# Patient Record
Sex: Male | Born: 1980 | Race: Black or African American | Hispanic: No | Marital: Single | Smoking: Never smoker
Health system: Southern US, Community
[De-identification: ages and names within clinical notes are randomized; demographics above are authoritative.]

## PROBLEM LIST (undated history)

## (undated) DIAGNOSIS — K5090 Crohn's disease, unspecified, without complications: Secondary | ICD-10-CM

## (undated) DIAGNOSIS — E559 Vitamin D deficiency, unspecified: Secondary | ICD-10-CM

## (undated) HISTORY — DX: Vitamin D deficiency, unspecified: E55.9

## (undated) HISTORY — PX: COLONOSCOPY: SHX174

## (undated) HISTORY — PX: OTHER SURGICAL HISTORY: SHX169

---

## 2016-03-15 ENCOUNTER — Encounter (HOSPITAL_COMMUNITY): Payer: Self-pay | Admitting: Emergency Medicine

## 2016-03-15 DIAGNOSIS — K50814 Crohn's disease of both small and large intestine with abscess: Secondary | ICD-10-CM | POA: Diagnosis not present

## 2016-03-15 DIAGNOSIS — K6812 Psoas muscle abscess: Secondary | ICD-10-CM | POA: Diagnosis present

## 2016-03-15 DIAGNOSIS — D638 Anemia in other chronic diseases classified elsewhere: Secondary | ICD-10-CM | POA: Diagnosis present

## 2016-03-15 DIAGNOSIS — K651 Peritoneal abscess: Secondary | ICD-10-CM | POA: Diagnosis present

## 2016-03-15 DIAGNOSIS — Z79899 Other long term (current) drug therapy: Secondary | ICD-10-CM

## 2016-03-15 DIAGNOSIS — Z7952 Long term (current) use of systemic steroids: Secondary | ICD-10-CM

## 2016-03-15 DIAGNOSIS — R634 Abnormal weight loss: Secondary | ICD-10-CM | POA: Diagnosis present

## 2016-03-15 DIAGNOSIS — K50114 Crohn's disease of large intestine with abscess: Secondary | ICD-10-CM | POA: Diagnosis not present

## 2016-03-15 DIAGNOSIS — Z6823 Body mass index (BMI) 23.0-23.9, adult: Secondary | ICD-10-CM

## 2016-03-15 DIAGNOSIS — R7989 Other specified abnormal findings of blood chemistry: Secondary | ICD-10-CM | POA: Diagnosis present

## 2016-03-15 NOTE — ED Triage Notes (Signed)
Pt states he has crohn's disease and has been having a lot of issues with it lately  Pt states he has a knot in his right lower quadrant about the size of a baseball that is causing him pain that is radiating down his right leg  Pt states he has had some weight loss over the past month  Pt states it is getting harder for him to pass his stool  Last BM was today and he states it was hard  Denies passing any blood in his stool

## 2016-03-16 ENCOUNTER — Emergency Department (HOSPITAL_COMMUNITY): Payer: BLUE CROSS/BLUE SHIELD

## 2016-03-16 ENCOUNTER — Inpatient Hospital Stay (HOSPITAL_COMMUNITY)
Admission: EM | Admit: 2016-03-16 | Discharge: 2016-03-27 | DRG: 385 | Disposition: A | Discharge status: Home Health | Payer: BLUE CROSS/BLUE SHIELD | Attending: Internal Medicine | Admitting: Internal Medicine

## 2016-03-16 DIAGNOSIS — K6812 Psoas muscle abscess: Secondary | ICD-10-CM | POA: Diagnosis present

## 2016-03-16 DIAGNOSIS — K50114 Crohn's disease of large intestine with abscess: Secondary | ICD-10-CM

## 2016-03-16 DIAGNOSIS — R634 Abnormal weight loss: Secondary | ICD-10-CM

## 2016-03-16 DIAGNOSIS — K5010 Crohn's disease of large intestine without complications: Secondary | ICD-10-CM | POA: Diagnosis present

## 2016-03-16 DIAGNOSIS — R633 Feeding difficulties, unspecified: Secondary | ICD-10-CM

## 2016-03-16 DIAGNOSIS — Z09 Encounter for follow-up examination after completed treatment for conditions other than malignant neoplasm: Secondary | ICD-10-CM

## 2016-03-16 DIAGNOSIS — Z7952 Long term (current) use of systemic steroids: Secondary | ICD-10-CM | POA: Diagnosis not present

## 2016-03-16 DIAGNOSIS — K50814 Crohn's disease of both small and large intestine with abscess: Secondary | ICD-10-CM | POA: Diagnosis present

## 2016-03-16 DIAGNOSIS — R7989 Other specified abnormal findings of blood chemistry: Secondary | ICD-10-CM | POA: Diagnosis present

## 2016-03-16 DIAGNOSIS — B9620 Unspecified Escherichia coli [E. coli] as the cause of diseases classified elsewhere: Secondary | ICD-10-CM | POA: Diagnosis not present

## 2016-03-16 DIAGNOSIS — D509 Iron deficiency anemia, unspecified: Secondary | ICD-10-CM | POA: Diagnosis present

## 2016-03-16 DIAGNOSIS — IMO0002 Reserved for concepts with insufficient information to code with codable children: Secondary | ICD-10-CM

## 2016-03-16 DIAGNOSIS — K651 Peritoneal abscess: Secondary | ICD-10-CM | POA: Diagnosis present

## 2016-03-16 DIAGNOSIS — K50014 Crohn's disease of small intestine with abscess: Secondary | ICD-10-CM | POA: Diagnosis not present

## 2016-03-16 DIAGNOSIS — D72829 Elevated white blood cell count, unspecified: Secondary | ICD-10-CM | POA: Diagnosis not present

## 2016-03-16 DIAGNOSIS — D649 Anemia, unspecified: Secondary | ICD-10-CM | POA: Diagnosis not present

## 2016-03-16 DIAGNOSIS — R935 Abnormal findings on diagnostic imaging of other abdominal regions, including retroperitoneum: Secondary | ICD-10-CM

## 2016-03-16 DIAGNOSIS — Z79899 Other long term (current) drug therapy: Secondary | ICD-10-CM | POA: Diagnosis not present

## 2016-03-16 DIAGNOSIS — B9689 Other specified bacterial agents as the cause of diseases classified elsewhere: Secondary | ICD-10-CM | POA: Diagnosis not present

## 2016-03-16 DIAGNOSIS — Z6823 Body mass index (BMI) 23.0-23.9, adult: Secondary | ICD-10-CM | POA: Diagnosis not present

## 2016-03-16 DIAGNOSIS — D638 Anemia in other chronic diseases classified elsewhere: Secondary | ICD-10-CM | POA: Diagnosis present

## 2016-03-16 HISTORY — DX: Crohn's disease, unspecified, without complications: K50.90

## 2016-03-16 LAB — COMPREHENSIVE METABOLIC PANEL
ALK PHOS: 39 U/L (ref 38–126)
ALT: 12 U/L — ABNORMAL LOW (ref 17–63)
ANION GAP: 8 (ref 5–15)
AST: 13 U/L — ABNORMAL LOW (ref 15–41)
Albumin: 3.3 g/dL — ABNORMAL LOW (ref 3.5–5.0)
BILIRUBIN TOTAL: 0.4 mg/dL (ref 0.3–1.2)
BUN: 12 mg/dL (ref 6–20)
CALCIUM: 8.8 mg/dL — AB (ref 8.9–10.3)
CO2: 25 mmol/L (ref 22–32)
Chloride: 102 mmol/L (ref 101–111)
Creatinine, Ser: 0.78 mg/dL (ref 0.61–1.24)
GFR calc non Af Amer: >60 mL/min (ref >60)
Glucose, Bld: 96 mg/dL (ref 65–99)
POTASSIUM: 3.7 mmol/L (ref 3.5–5.1)
SODIUM: 135 mmol/L (ref 135–145)
TOTAL PROTEIN: 7.8 g/dL (ref 6.5–8.1)

## 2016-03-16 LAB — CBC WITH DIFFERENTIAL/PLATELET
Basophils Absolute: 0 10*3/uL (ref 0.0–0.1)
Basophils Relative: 0 %
EOS ABS: 0.1 10*3/uL (ref 0.0–0.7)
Eosinophils Relative: 1 %
HEMATOCRIT: 33 % — AB (ref 39.0–52.0)
HEMOGLOBIN: 10.6 g/dL — AB (ref 13.0–17.0)
LYMPHS ABS: 1.6 10*3/uL (ref 0.7–4.0)
Lymphocytes Relative: 13 %
MCH: 26.2 pg (ref 26.0–34.0)
MCHC: 32.1 g/dL (ref 30.0–36.0)
MCV: 81.5 fL (ref 78.0–100.0)
MONO ABS: 1.2 10*3/uL — AB (ref 0.1–1.0)
MONOS PCT: 10 %
NEUTROS PCT: 76 %
Neutro Abs: 9.3 10*3/uL — ABNORMAL HIGH (ref 1.7–7.7)
Platelets: 483 10*3/uL — ABNORMAL HIGH (ref 150–400)
RBC: 4.05 MIL/uL — ABNORMAL LOW (ref 4.22–5.81)
RDW: 14.1 % (ref 11.5–15.5)
WBC: 12.3 10*3/uL — ABNORMAL HIGH (ref 4.0–10.5)

## 2016-03-16 LAB — URINALYSIS, ROUTINE W REFLEX MICROSCOPIC
Bilirubin Urine: NEGATIVE
GLUCOSE, UA: NEGATIVE mg/dL
Ketones, ur: 5 mg/dL — AB
Leukocytes, UA: NEGATIVE
NITRITE: NEGATIVE
PH: 5 (ref 5.0–8.0)
Protein, ur: 30 mg/dL — AB
SPECIFIC GRAVITY, URINE: 1.029 (ref 1.005–1.030)

## 2016-03-16 LAB — LIPASE, BLOOD: Lipase: 19 U/L (ref 11–51)

## 2016-03-16 MED ORDER — ALBUTEROL SULFATE (2.5 MG/3ML) 0.083% IN NEBU: 2.5000 mg | INHALATION_SOLUTION | RESPIRATORY_TRACT | Status: DC | PRN

## 2016-03-16 MED ORDER — SODIUM CHLORIDE 0.9 % IV SOLN
INTRAVENOUS | Status: AC
  Administered 2016-03-16 – 2016-03-17 (×4): via INTRAVENOUS

## 2016-03-16 MED ORDER — PANTOPRAZOLE SODIUM 40 MG IV SOLR
40.0000 mg | Freq: Two times a day (BID) | INTRAVENOUS | Status: DC
  Administered 2016-03-16 (×2): 40 mg via INTRAVENOUS
  Filled 2016-03-16 (×2): qty 40

## 2016-03-16 MED ORDER — IOPAMIDOL (ISOVUE-300) INJECTION 61%
30.0000 mL | Freq: Once | INTRAVENOUS | Status: AC | PRN
  Administered 2016-03-16: 30 mL via ORAL

## 2016-03-16 MED ORDER — KETOROLAC TROMETHAMINE 30 MG/ML IJ SOLN
15.0000 mg | Freq: Once | INTRAMUSCULAR | Status: AC
  Administered 2016-03-16: 15 mg via INTRAVENOUS
  Filled 2016-03-16: qty 1

## 2016-03-16 MED ORDER — ENOXAPARIN SODIUM 30 MG/0.3ML ~~LOC~~ SOLN
30.0000 mg | SUBCUTANEOUS | Status: DC
  Administered 2016-03-17: 30 mg via SUBCUTANEOUS
  Filled 2016-03-16 (×2): qty 0.3

## 2016-03-16 MED ORDER — ONDANSETRON HCL 4 MG/2ML IJ SOLN: 4.0000 mg | Freq: Four times a day (QID) | INTRAMUSCULAR | Status: DC | PRN

## 2016-03-16 MED ORDER — METHYLPREDNISOLONE SODIUM SUCC 125 MG IJ SOLR
125.0000 mg | Freq: Once | INTRAMUSCULAR | Status: AC
  Administered 2016-03-16: 125 mg via INTRAVENOUS
  Filled 2016-03-16: qty 2

## 2016-03-16 MED ORDER — PIPERACILLIN-TAZOBACTAM 3.375 G IVPB
3.3750 g | Freq: Three times a day (TID) | INTRAVENOUS | Status: DC
  Administered 2016-03-16 – 2016-03-24 (×24): 3.375 g via INTRAVENOUS
  Filled 2016-03-16 (×20): qty 50

## 2016-03-16 MED ORDER — MORPHINE SULFATE (PF) 2 MG/ML IV SOLN
1.0000 mg | INTRAVENOUS | Status: DC | PRN
  Administered 2016-03-16 – 2016-03-20 (×19): 2 mg via INTRAVENOUS
  Filled 2016-03-16 (×19): qty 1

## 2016-03-16 MED ORDER — PIPERACILLIN-TAZOBACTAM 3.375 G IVPB
3.3750 g | Freq: Once | INTRAVENOUS | Status: AC
  Administered 2016-03-16: 3.375 g via INTRAVENOUS
  Filled 2016-03-16: qty 50

## 2016-03-16 MED ORDER — IOPAMIDOL (ISOVUE-300) INJECTION 61%
INTRAVENOUS | Status: AC
  Administered 2016-03-16: 30 mL via ORAL
  Filled 2016-03-16: qty 30

## 2016-03-16 MED ORDER — ONDANSETRON HCL 4 MG PO TABS: 4.0000 mg | ORAL_TABLET | Freq: Four times a day (QID) | ORAL | Status: DC | PRN

## 2016-03-16 MED ORDER — IOPAMIDOL (ISOVUE-300) INJECTION 61%
100.0000 mL | Freq: Once | INTRAVENOUS | Status: AC | PRN
  Administered 2016-03-16: 100 mL via INTRAVENOUS

## 2016-03-16 MED ORDER — SODIUM CHLORIDE 0.9 % IV BOLUS (SEPSIS)
1000.0000 mL | INTRAVENOUS | Status: AC
  Administered 2016-03-16: 1000 mL via INTRAVENOUS

## 2016-03-16 MED ORDER — AZATHIOPRINE 50 MG PO TABS
100.0000 mg | ORAL_TABLET | Freq: Every day | ORAL | Status: DC
  Administered 2016-03-16: 100 mg via ORAL
  Filled 2016-03-16: qty 2

## 2016-03-16 NOTE — H&P (Addendum)
History and Physical    [name] PQZ:[id number] DOB: 03/01/1980 DOA: 03/16/2016  Referring MD/NP/PA: Antonietta Breach PA-C PCP: Pcp Not In System  Patient coming from: home  Chief Complaint: Abd  HPI: Larry [name] is a 36 y.o. male with medical history significant of Crohn's colitis on azathioprine originally diagnosed in 2015; who presents with waxing and waning sharp right lower quadrant abdominal pain. Patient notes symptoms initially flared up approximately 5 months ago. He was evaluated at [facility] by CT scan which showed inflammation. At that time he reports being placed on steroid taper of 40 mg of prednisone that was slowly tapered down with approximately 2 week intervals to approximately 5 mg. However, sometime in late November/early December symptoms worsen which which he was advised by his gastroenterologist to increase dose of prednisone back to 10 mg, but had been since tapered down to 5 mg the over last 2 weeks. Reports being followed by Dr. Doristine Mango of gastroenterology in the outpatient setting who was previously at [facility], but recently switched practices at the end of December. Dr. Britta Mccreedy needed the patient's records from his practice to continue to see him. Since that time he notes more frequent right lower abdominal quadrant pain and reports that the area swells up to the size of a baseball. He reports only having 1 hard stool every 3 days or so. Associated symptoms include right leg pain and weight loss of 15 pounds at least in the last month and approximately 35 in the last 6 months. Denies having any fever, chills, nausea, vomiting, chills, dark stools, or dysuria. Previously, when the patient was initially diagnosed with Crohn's disease back in 2015 he reported having abscess for which surgical intervention was required.  ED Course: Upon admission into the ED the patient was found to be afebrile with vitals within normal limits.  Lab work  revealed WBC 12.3, hemoglobin 10.6, platelets 483, and all other labs relatively within normal limits. CT scan revealed active Crohn's colitis with abscess collections in the right lower quadrant involving iliopsoas muscle. Patient given 125 mg of Solu-Medrol and Zosyn while in the ED.  Review of Systems: As per HPI otherwise 10 point review of systems negative.   Past Medical History:  Diagnosis Date  . Crohn's disease [facility])     Past Surgical History:  Procedure Laterality Date  . bowel abscess from crohns        reports that he has never smoked. He has never used smokeless tobacco. He reports that he does not drink alcohol or use drugs.  No Known Allergies  History reviewed. No pertinent family history.  Prior to Admission medications   Medication Sig Start Date End Date Taking? Authorizing Provider  acetaminophen (TYLENOL) 500 MG tablet Take 1,000 mg by mouth every 6 (six) hours as needed for moderate pain.   Yes Historical Provider, MD  azaTHIOprine (IMURAN) 50 MG tablet Take 2 tablets by mouth daily. 02/18/16  Yes Historical Provider, MD  predniSONE (DELTASONE) 10 MG tablet Take 5 mg by mouth daily.  03/02/16  Yes Historical Provider, MD    Physical Exam:  Constitutional: Young male in NAD, calm, comfortable Vitals:   03/15/16 2340 03/16/16 0334  BP: 118/71 113/69  Pulse: 82 86  Resp: 16 16  Temp: 98.3 F (36.8 C)   TempSrc: Oral   SpO2: 97% 99%  Weight: 78.5 kg (173 lb)   Height: 6' (1.829 m)    Eyes: PERRL, lids and conjunctivae normal ENMT: Mucous  membranes are moist. Posterior pharynx clear of any exudate or lesions.Normal dentition.  Neck: normal, supple, no masses, no thyromegaly Respiratory: clear to auscultation bilaterally, no wheezing, no crackles. Normal respiratory effort. No accessory muscle use.  Cardiovascular: Regular rate and rhythm, no murmurs / rubs / gallops. No extremity edema. 2+ pedal pulses. No carotid bruits.  Abdomen: Mild to moderate right  lower quadrant tenderness, no masses palpated. No hepatosplenomegaly. Bowel sounds positive.  Musculoskeletal: no clubbing / cyanosis. No joint deformity upper and lower extremities. Good ROM, no contractures. Normal muscle tone.  Skin: no rashes, lesions, ulcers. No induration Neurologic: CN 2-12 grossly intact. Sensation intact, DTR normal. Strength 5/5 in all 4.  Psychiatric: Normal judgment and insight. Alert and oriented x 3. Normal mood.     Labs on Admission: I have personally reviewed following labs and imaging studies  CBC:  Recent Labs Lab 03/16/16 0101  WBC 12.3*  NEUTROABS 9.3*  HGB 10.6*  HCT 33.0*  MCV 81.5  PLT 329*   Basic Metabolic Panel:  Recent Labs Lab 03/16/16 0101  NA 135  K 3.7  CL 102  CO2 25  GLUCOSE 96  BUN 12  CREATININE 0.78  CALCIUM 8.8*   GFR: Estimated Creatinine Clearance: 144.2 mL/min (by C-G formula based on SCr of 0.78 mg/dL). Liver Function Tests:  Recent Labs Lab 03/16/16 0101  AST 13*  ALT 12*  ALKPHOS 39  BILITOT 0.4  PROT 7.8  ALBUMIN 3.3*    Recent Labs Lab 03/16/16 0101  LIPASE 19   No results for input(s): AMMONIA in the last 168 hours. Coagulation Profile: No results for input(s): INR, PROTIME in the last 168 hours. Cardiac Enzymes: No results for input(s): CKTOTAL, CKMB, CKMBINDEX, TROPONINI in the last 168 hours. BNP (last 3 results) No results for input(s): PROBNP in the last 8760 hours. HbA1C: No results for input(s): HGBA1C in the last 72 hours. CBG: No results for input(s): GLUCAP in the last 168 hours. Lipid Profile: No results for input(s): CHOL, HDL, LDLCALC, TRIG, CHOLHDL, LDLDIRECT in the last 72 hours. Thyroid Function Tests: No results for input(s): TSH, T4TOTAL, FREET4, T3FREE, THYROIDAB in the last 72 hours. Anemia Panel: No results for input(s): VITAMINB12, FOLATE, FERRITIN, TIBC, IRON, RETICCTPCT in the last 72 hours. Urine analysis:    Component Value Date/Time   COLORURINE AMBER  (A) 03/16/2016 0219   APPEARANCEUR CLEAR 03/16/2016 0219   LABSPEC 1.029 03/16/2016 0219   PHURINE 5.0 03/16/2016 0219   GLUCOSEU NEGATIVE 03/16/2016 0219   HGBUR SMALL (A) 03/16/2016 0219   BILIRUBINUR NEGATIVE 03/16/2016 0219   KETONESUR 5 (A) 03/16/2016 0219   PROTEINUR 30 (A) 03/16/2016 0219   NITRITE NEGATIVE 03/16/2016 0219   LEUKOCYTESUR NEGATIVE 03/16/2016 0219   Sepsis Labs: No results found for this or any previous visit (from the past 240 hour(s)).   Radiological Exams on Admission: Ct Abdomen Pelvis W Contrast  Result Date: 03/16/2016 CLINICAL DATA:  Abdominal pain. Right lower quadrant pain. Weight loss. Crohn's disease. EXAM: CT ABDOMEN AND PELVIS WITH CONTRAST TECHNIQUE: Multidetector CT imaging of the abdomen and pelvis was performed using the standard protocol following bolus administration of intravenous contrast. CONTRAST:  15m ISOVUE-300 IOPAMIDOL (ISOVUE-300) INJECTION 61% COMPARISON:  [facility] FINDINGS: Lower chest: The lung bases are clear. Hepatobiliary: No focal liver abnormality is seen. No gallstones, gallbladder wall thickening, or biliary dilatation. Pancreas: No ductal dilatation or inflammation. Spleen: Normal in size without focal abnormality. Adrenals/Urinary Tract: Normal adrenal  glands. Symmetric renal enhancement without hydronephrosis. Urinary bladder is physiologically distended. Stomach/Bowel: Wall thickening involving the terminal ileum and cecum with moderate adjacent mesenteric inflammation consistent with active Crohn's. There multiple adjacent irregular fluid collections with peripheral enhancement in the right lower quadrant consistent with abscess. These involve both the adjacent fat and right iliopsoas muscle. A discrete collection anteriorly measures 2.8 x 1.7 cm. This may be contiguous with the more posterior collection measuring 2.5 x 4.7 cm, which involves the iliopsoas muscle. The appendix is  tentatively identified containing intraluminal fluid with similar stranding to the adjacent inflamed cecum and ascending colon. No other bowel wall thickening to suggest additional sites of active Crohn's. Moderate stool in the more distal colon. Vascular/Lymphatic: No significant vascular findings are present. No enlarged abdominal or pelvic lymph nodes. Reproductive: Prostate is unremarkable. Other: Right lower quadrant abscesses as described. No pelvic free fluid. No free air. Musculoskeletal: Heterogeneous edema of the right iliopsoas muscle with small intramuscular abscess as described. Mild heterogeneous enlargement of the distal iliopsoas likely myositis. There are no acute or suspicious osseous abnormalities. IMPRESSION: Active Crohn's involving the terminal ileum and cecum. There are adjacent irregular abscesses in the right lower quadrant involving the adjacent fat and iliopsoas muscle. Fluid collections are irregular in shape, and measure approximately 2.8 and 4.7 cm respectively. Electronically Signed   By: Jeb Levering M.D.   On: 03/16/2016 03:24      Assessment/Plan Crohn's colitis, with abscess: Acute. Patient reports worsening right lower quadrant pain. Imaging studies revealed active Crohn's flare of the terminal ileum and cecum with abscesses in the right lower quadrant involving iliopsoas muscle. Given 125 mg of Solu-Medrol and Zosyn in the ED - Admit to MedSurg bed - NPO except for meds - Strict ins and outs - Continue Zosyn   - Morphine IV prn severe pain - Will need to determine need of continued high-dose steroids - Requested records from [facility]. - Will likely need to consult GI in a.m. to determine best course of treatment including possible need of IR/surgery.     Leukocytosis: WBC elevated at 12.3 suspect secondary to acute disease with possible infection. Lower on the differential includes the possibility of being due to chronic steroid  use. - Continue to monitor  Anemia: Initial hemoglobin on to be 10.6. - Continue to monitor    Thrombocytosis: likely reactive   Weight loss: Weight loss patient notes a 15 pound weight loss in the month.  GI prophylaxis:Protonix IV  DVT prophylaxis: SCDs  Code Status: Full  Family Communication: Discuss plan of care with the patient is mother who is present at bedside  Disposition Plan: Likely discharge home once medically stable  Consults called: None  Admission status: Observation   Norval Morton MD [facility] Pager [phone]  If 7PM-7AM, please contact night-coverage [email or web address] Password [facility]  03/16/2016, 4:27 AM

## 2016-03-16 NOTE — Consult Note (Signed)
Reason for Consult:  Crohn's with intraabdominal abscess Referring Physician: Dr. Luvenia Starch GI:  Dr. Acquanetta Belling  Larry [name] is an 36 y.o. male w/ medical history of crohn's on azathioprine, originally diagnosed in 2015, who presented to [facility] with worsening abdominal pain. States he had a crohn's flare in 09/2015 at which time he was started on Prednisone and placed on a taper. Towards the end of his taper the abdominal pain returned, bringing him to the ED. Today he endorses increasing pain in his RLQ/right hip and swelling in the RLQ reported to be the size of a baseball. Associated symptoms include anorexia, hard stools q3days, 15 pound weight loss in the last month and 35 pounds over the last 6 months. He denies fever, chills, CP, SOB, dysuria, hematuria, hematochezia, and weakness/near-syncope. Current medicines include Imuran and Prednisone. He has a history of diagnostic laparoscopy in 2015. He denies use of blood thinning medications.  Work up shows he is afebrile, VSS.  WBC is 12.3, H/H is 10.6 and 33.   CT scan show:  Active Crohn's involving the terminal ileum and cecum. There are adjacent irregular abscesses in the right lower quadrant involving the adjacent fat and iliopsoas muscle. Fluid collections are irregular in shape, and measure approximately 2.8 and 4.7 cm respectively.  Past Medical History:  Diagnosis Date  . Crohn's disease [facility])     Past Surgical History:  Procedure Laterality Date  . bowel abscess from crohns       History reviewed. No pertinent family history.  Social History:  reports that he has never smoked. He has never used smokeless tobacco. He reports that he does not drink alcohol or use drugs.  Allergies: No Known Allergies  Medications: I have reviewed the patient's current medications.  Results for orders placed or performed during the hospital encounter of 03/16/16 (from the past 48 hour(s))  CBC with Differential     Status: Abnormal    Collection Time: 03/16/16  1:01 AM  Result Value Ref Range   WBC 12.3 (H) 4.0 - 10.5 K/uL   RBC 4.05 (L) 4.22 - 5.81 MIL/uL   Hemoglobin 10.6 (L) 13.0 - 17.0 g/dL   HCT 33.0 (L) 39.0 - 52.0 %   MCV 81.5 78.0 - 100.0 fL   MCH 26.2 26.0 - 34.0 pg   MCHC 32.1 30.0 - 36.0 g/dL   RDW 14.1 11.5 - 15.5 %   Platelets 483 (H) 150 - 400 K/uL   Neutrophils Relative % 76 %   Neutro Abs 9.3 (H) 1.7 - 7.7 K/uL   Lymphocytes Relative 13 %   Lymphs Abs 1.6 0.7 - 4.0 K/uL   Monocytes Relative 10 %   Monocytes Absolute 1.2 (H) 0.1 - 1.0 K/uL   Eosinophils Relative 1 %   Eosinophils Absolute 0.1 0.0 - 0.7 K/uL   Basophils Relative 0 %   Basophils Absolute 0.0 0.0 - 0.1 K/uL  Comprehensive metabolic panel     Status: Abnormal   Collection Time: 03/16/16  1:01 AM  Result Value Ref Range   Sodium 135 135 - 145 mmol/L   Potassium 3.7 3.5 - 5.1 mmol/L   Chloride 102 101 - 111 mmol/L   CO2 25 22 - 32 mmol/L   Glucose, Bld 96 65 - 99 mg/dL   BUN 12 6 - 20 mg/dL   Creatinine, Ser 0.78 0.61 - 1.24 mg/dL   Calcium 8.8 (L) 8.9 - 10.3 mg/dL   Total Protein 7.8 6.5 - 8.1 g/dL  Albumin 3.3 (L) 3.5 - 5.0 g/dL   AST 13 (L) 15 - 41 U/L   ALT 12 (L) 17 - 63 U/L   Alkaline Phosphatase 39 38 - 126 U/L   Total Bilirubin 0.4 0.3 - 1.2 mg/dL   GFR calc non Af Amer >60 >60 mL/min   GFR calc Af Amer >60 >60 mL/min    Comment: (NOTE) The eGFR has been calculated using the CKD EPI equation. This calculation has not been validated in all clinical situations. eGFR's persistently <60 mL/min signify possible Chronic Kidney Disease.    Anion gap 8 5 - 15  Lipase, blood     Status: None   Collection Time: 03/16/16  1:01 AM  Result Value Ref Range   Lipase 19 11 - 51 U/L  Urinalysis, Routine w reflex microscopic     Status: Abnormal   Collection Time: 03/16/16  2:19 AM  Result Value Ref Range   Color, Urine AMBER (A) YELLOW    Comment: BIOCHEMICALS MAY BE AFFECTED BY COLOR   APPearance CLEAR CLEAR   Specific  Gravity, Urine 1.029 1.005 - 1.030   pH 5.0 5.0 - 8.0   Glucose, UA NEGATIVE NEGATIVE mg/dL   Hgb urine dipstick SMALL (A) NEGATIVE   Bilirubin Urine NEGATIVE NEGATIVE   Ketones, ur 5 (A) NEGATIVE mg/dL   Protein, ur 30 (A) NEGATIVE mg/dL   Nitrite NEGATIVE NEGATIVE   Leukocytes, UA NEGATIVE NEGATIVE   RBC / HPF 0-5 0 - 5 RBC/hpf   WBC, UA 0-5 0 - 5 WBC/hpf   Bacteria, UA RARE (A) NONE SEEN   Squamous Epithelial / LPF 0-5 (A) NONE SEEN   Mucous PRESENT    Ca Oxalate Crys, UA PRESENT     Ct Abdomen Pelvis W Contrast  Result Date: 03/16/2016 CLINICAL DATA:  Abdominal pain. Right lower quadrant pain. Weight loss. Crohn's disease. EXAM: CT ABDOMEN AND PELVIS WITH CONTRAST TECHNIQUE: Multidetector CT imaging of the abdomen and pelvis was performed using the standard protocol following bolus administration of intravenous contrast. CONTRAST:  135m ISOVUE-300 IOPAMIDOL (ISOVUE-300) INJECTION 61% COMPARISON:  [facility] FINDINGS: Lower chest: The lung bases are clear. Hepatobiliary: No focal liver abnormality is seen. No gallstones, gallbladder wall thickening, or biliary dilatation. Pancreas: No ductal dilatation or inflammation. Spleen: Normal in size without focal abnormality. Adrenals/Urinary Tract: Normal adrenal glands. Symmetric renal enhancement without hydronephrosis. Urinary bladder is physiologically distended. Stomach/Bowel: Wall thickening involving the terminal ileum and cecum with moderate adjacent mesenteric inflammation consistent with active Crohn's. There multiple adjacent irregular fluid collections with peripheral enhancement in the right lower quadrant consistent with abscess. These involve both the adjacent fat and right iliopsoas muscle. A discrete collection anteriorly measures 2.8 x 1.7 cm. This may be contiguous with the more posterior collection measuring 2.5 x 4.7 cm, which involves the iliopsoas muscle. The appendix is  tentatively identified containing intraluminal fluid with similar stranding to the adjacent inflamed cecum and ascending colon. No other bowel wall thickening to suggest additional sites of active Crohn's. Moderate stool in the more distal colon. Vascular/Lymphatic: No significant vascular findings are present. No enlarged abdominal or pelvic lymph nodes. Reproductive: Prostate is unremarkable. Other: Right lower quadrant abscesses as described. No pelvic free fluid. No free air. Musculoskeletal: Heterogeneous edema of the right iliopsoas muscle with small intramuscular abscess as described. Mild heterogeneous enlargement of the distal iliopsoas likely myositis. There are no acute or suspicious osseous abnormalities. IMPRESSION: Active Crohn's involving the terminal  ileum and cecum. There are adjacent irregular abscesses in the right lower quadrant involving the adjacent fat and iliopsoas muscle. Fluid collections are irregular in shape, and measure approximately 2.8 and 4.7 cm respectively. Electronically Signed   By: Jeb Levering M.D.   On: 03/16/2016 03:24    Review of Systems  Constitutional: Negative for chills and fever.  Respiratory: Negative for shortness of breath.   Cardiovascular: Negative for chest pain and palpitations.  Gastrointestinal: Positive for abdominal pain. Negative for blood in stool, diarrhea, melena, nausea and vomiting.  Genitourinary: Negative for dysuria and frequency.  Musculoskeletal: Negative for myalgias.  Neurological: Negative for loss of consciousness.  All other systems reviewed and are negative.  Blood pressure 116/65, pulse 83, temperature 98.6 F (37 C), temperature source Oral, resp. rate 18, height 6' (1.829 m), weight 78.5 kg (173 lb), SpO2 100 %. Physical Exam General: Pleasant AA male laying on hospital bed, appears stated age, NAD. HEENT: head -normocephalic, atraumatic; Eyes: no conjunctival injection; mucous membranes moist Neck- Trachea is  midline, no thyromegaly or JVD appreciated.  CV- RRR, normal S1/S2, no M/R/G Pulm- breathing is non-labored. CTABL, no wheezes, rhales, rhonchi. Abd- soft, NT/ND, appropriate bowel sounds in 4 quadrants, no masses, hernias, or organomegaly. Laparoscopic scars RUQ GU- deferred  MSK- UE/LE symmetrical, no cyanosis, clubbing, or edema. Neuro- CN II-XII grossly in tact, no paresthesias. Psych- Alert and Oriented x3 with appropriate affect Skin: warm and dry, no rashes or lesions  Assessment/Plan:  Crohn's colitis with RLQ fluid collection - RLQ pain and swelling x 4 weeks, weight loss - WBC 12.3 - empiric Zosyn for intra-abdominal abscesses - medical management of active crohn's per GI  - IR consult for percutaneous drainage of fluid collection (s)  FEN: NPO, IVF ID: [email or web address] VTE: Lovenox, SCD's  Plan: No acute surgical needs. Having bowel function. Recommend medical management of active Crohn's disease, empiric IV antibiotics, and consult to IR for possible percutaneous drainage of RLQ fluid collection(s) with GS/Cultures. General surgery will follow.  Obie Dredge, PA-C [facility] Pager: [phone] Consults: [phone] Mon-Fri 7:00 am-4:30 pm Sat-Sun 7:00 am-11:30 am

## 2016-03-16 NOTE — ED Notes (Signed)
Pt given urinal and states that he will hit the call bell when he is finished.

## 2016-03-16 NOTE — Consult Note (Signed)
Referring Provider: Dr. Waldron Labs Primary Care Physician:  Pcp Not In System Primary Gastroenterologist:  Dr. Britta Mccreedy in [place]  Reason for Consultation:  Crohn's disease with abscess  HPI: Larry [name] is a 36 y.o. male with medical history significant of Crohn's colitis on azathioprine originally diagnosed in 2015.  He says that when he presented at that time he had abscesses that had to be drained.  Then he followed up with Dr. Britta Mccreedy in [place] who apparently did colonoscopy, which was reportedly normal.  He has been on Imuran 100 mg daily since that time.  The only other medication he had been on is prednisone.  He says that the last time he saw Dr. Britta Mccreedy was in August when he was placed on prednisone for a flare.  That was tapered but once he got to lower doses he continued to have problems with pain again.  Never completely came off of prednisone and has been taking at least 5 mg daily up to this point.  Dr. Britta Mccreedy has relocated his practice so the patient has not seen him back again since August.  Patient tells me that he went to the ED at [facility] 2 weeks ago for abdominal pain and they did not do any testing, just gave him pain medication.  He presented here overnight with complaints of sharp right lower quadrant abdominal pain.  He reports only having 1 hard stool every 3 days or so, no diarrhea or rectal bleeding.  Appetite has been poor with associated weight loss.  Also complains of right leg/hip pain. Denies having any fever, chills, nausea, vomiting.   ED Course: Upon admission into the ED the patient was found to be afebrile with vitals within normal limits.  Lab work revealed WBC 12.3, hemoglobin 10.6 grams, platelets 483, and all other labs relatively within normal limits. CT scan revealed active Crohn's colitis with abscess collections in the right lower quadrant involving iliopsoas muscle (as below). Patient given 125 mg of Solu-Medrol and started on Zosyn while in the  ED.  IMPRESSION: Active Crohn's involving the terminal ileum and cecum. There are adjacent irregular abscesses in the right lower quadrant involving the adjacent fat and iliopsoas muscle. Fluid collections are irregular in shape, and measure approximately 2.8 and 4.7 cm respectively.   Past Medical History:  Diagnosis Date  . Crohn's disease [facility])     Past Surgical History:  Procedure Laterality Date  . bowel abscess from crohns       Prior to Admission medications   Medication Sig Start Date End Date Taking? Authorizing Provider  acetaminophen (TYLENOL) 500 MG tablet Take 1,000 mg by mouth every 6 (six) hours as needed for moderate pain.   Yes Historical Provider, MD  azaTHIOprine (IMURAN) 50 MG tablet Take 2 tablets by mouth daily. 02/18/16  Yes Historical Provider, MD  predniSONE (DELTASONE) 10 MG tablet Take 5 mg by mouth daily.  03/02/16  Yes Historical Provider, MD    Current Facility-Administered Medications  Medication Dose Route Frequency Provider Last Rate Last Dose  . 0.9 %  sodium chloride infusion   Intravenous Continuous Norval Morton, MD 100 mL/hr at 03/16/16 7017    . albuterol (PROVENTIL) (2.5 MG/3ML) 0.083% nebulizer solution 2.5 mg  2.5 mg Nebulization Q2H PRN Norval Morton, MD      . azaTHIOprine (IMURAN) tablet 100 mg  100 mg Oral Daily Rondell A Smith, MD      . morphine 2 MG/ML injection 1-2 mg  1-2 mg Intravenous  Q2H PRN Norval Morton, MD   2 mg at 03/16/16 9373  . ondansetron (ZOFRAN) tablet 4 mg  4 mg Oral Q6H PRN Norval Morton, MD       Or  . ondansetron (ZOFRAN) injection 4 mg  4 mg Intravenous Q6H PRN Rondell A Tamala Julian, MD      . pantoprazole (PROTONIX) injection 40 mg  40 mg Intravenous Q12H Rondell A Tamala Julian, MD      . piperacillin-tazobactam (ZOSYN) IVPB 3.375 g  3.375 g Intravenous Q8H Norval Morton, MD        Allergies as of 03/15/2016  . (No Known Allergies)    History reviewed. No pertinent family history.  Social History   Social  History  . Marital status: Single    Spouse name: N/A  . Number of children: N/A  . Years of education: N/A   Occupational History  . Not on file.   Social History Main Topics  . Smoking status: Never Smoker  . Smokeless tobacco: Never Used  . Alcohol use No  . Drug use: No  . Sexual activity: Not on file   Other Topics Concern  . Not on file   Social History Narrative  . No narrative on file    Review of Systems: ROS is O/W negative except as mentioned in HPI.  Physical Exam: Vital signs in last 24 hours: Temp:  [98.3 F (36.8 C)-98.6 F (37 C)] 98.6 F (37 C) (01/19 0528) Pulse Rate:  [82-86] 83 (01/19 0528) Resp:  [16-18] 18 (01/19 0528) BP: (113-118)/(65-71) 116/65 (01/19 0528) SpO2:  [97 %-100 %] 100 % (01/19 0528) Weight:  [173 lb (78.5 kg)] 173 lb (78.5 kg) (01/19 0528) Last BM Date: 03/15/16 General:  Alert, Well-developed, well-nourished, pleasant and cooperative in NAD Head:  Normocephalic and atraumatic. Eyes:  Sclera clear, no icterus.  Conjunctiva pink. Ears:  Normal auditory acuity. Mouth:  No deformity or lesions.   Lungs:  Clear throughout to auscultation.   No wheezes, crackles, or rhonchi.  Heart:  Regular rate and rhythm; no murmurs, clicks, rubs, or gallops. Abdomen:  Soft, non-distended.  BS present.  Moderate TTP in RLQ. Rectal:  Deferred  Msk:  Symmetrical without gross deformities. Pulses:  Normal pulses noted. Extremities:  Without clubbing or edema. Neurologic:  Alert and oriented x 4;  grossly normal neurologically. Skin:  Intact without significant lesions or rashes. Psych:  Alert and cooperative. Normal mood and affect.  Intake/Output from previous day: 01/18 0701 - 01/19 0700 In: 50 [IV Piggyback:50] Out: -  Intake/Output this shift: Total I/O In: -  Out: 450 [Urine:450]  Lab Results:  Recent Labs  03/16/16 0101  WBC 12.3*  HGB 10.6*  HCT 33.0*  PLT 483*   BMET  Recent Labs  03/16/16 0101  NA 135  K 3.7  CL  102  CO2 25  GLUCOSE 96  BUN 12  CREATININE 0.78  CALCIUM 8.8*   LFT  Recent Labs  03/16/16 0101  PROT 7.8  ALBUMIN 3.3*  AST 13*  ALT 12*  ALKPHOS 39  BILITOT 0.4   Studies/Results: Ct Abdomen Pelvis W Contrast  Result Date: 03/16/2016 CLINICAL DATA:  Abdominal pain. Right lower quadrant pain. Weight loss. Crohn's disease. EXAM: CT ABDOMEN AND PELVIS WITH CONTRAST TECHNIQUE: Multidetector CT imaging of the abdomen and pelvis was performed using the standard protocol following bolus administration of intravenous contrast. CONTRAST:  157m ISOVUE-300 IOPAMIDOL (ISOVUE-300) INJECTION 61% COMPARISON:  [facility]  [place]: Lower chest: The lung bases are clear. Hepatobiliary: No focal liver abnormality is seen. No gallstones, gallbladder wall thickening, or biliary dilatation. Pancreas: No ductal dilatation or inflammation. Spleen: Normal in size without focal abnormality. Adrenals/Urinary Tract: Normal adrenal glands. Symmetric renal enhancement without hydronephrosis. Urinary bladder is physiologically distended. Stomach/Bowel: Wall thickening involving the terminal ileum and cecum with moderate adjacent mesenteric inflammation consistent with active Crohn's. There multiple adjacent irregular fluid collections with peripheral enhancement in the right lower quadrant consistent with abscess. These involve both the adjacent fat and right iliopsoas muscle. A discrete collection anteriorly measures 2.8 x 1.7 cm. This may be contiguous with the more posterior collection measuring 2.5 x 4.7 cm, which involves the iliopsoas muscle. The appendix is tentatively identified containing intraluminal fluid with similar stranding to the adjacent inflamed cecum and ascending colon. No other bowel wall thickening to suggest additional sites of active Crohn's. Moderate stool in the more distal colon. Vascular/Lymphatic: No significant vascular findings are present.  No enlarged abdominal or pelvic lymph nodes. Reproductive: Prostate is unremarkable. Other: Right lower quadrant abscesses as described. No pelvic free fluid. No free air. Musculoskeletal: Heterogeneous edema of the right iliopsoas muscle with small intramuscular abscess as described. Mild heterogeneous enlargement of the distal iliopsoas likely myositis. There are no acute or suspicious osseous abnormalities. IMPRESSION: Active Crohn's involving the terminal ileum and cecum. There are adjacent irregular abscesses in the right lower quadrant involving the adjacent fat and iliopsoas muscle. Fluid collections are irregular in shape, and measure approximately 2.8 and 4.7 cm respectively. Electronically Signed   By: Jeb Levering M.D.   On: 03/16/2016 03:24   IMPRESSION:  -36 year old male with small bowel Crohn's disease, diagnosed in 2015 at which time he presented with abscesses.  On imuran and prednisone.  Now presenting with recurrent RLQ abscesses on CT scan.  PLAN: -Agree with Zosyn. -I am discontinuing his Imuran. -He is going to need biologic therapy when these acute abscesses resolve so I am going to order labs for quantiferon gold and Hep B studies. -I have consulted surgery and discussed with IR.  Per Dr. Vernard Gambles, abscesses not amenable to drainage.  Will keep him on bowel rest and antibiotics and repeat imaging in a few days.   ZEHR, JESSICA D.  03/16/2016, 9:47 AM  Pager number [phone]

## 2016-03-16 NOTE — ED Provider Notes (Signed)
[name] DEPT Provider Note   CSN: [id number] Arrival date & time: 03/15/16  2326 By signing my name below, I, [name], attest that this documentation has been prepared under the direction and in the presence of non-physician practitioner, Antonietta Breach, PA-C. Electronically Signed: [name], Scribe. 03/16/2016. 12:40 AM.    History   Chief Complaint Chief Complaint  Patient presents with  . Abdominal Pain    HPI Larry [name] is a 36 y.o. male with a history of Crohn's disease, diagnosed 55, who presents to the Emergency Department complaining of a painful, baseball sized area of swelling to his RLQ onset five months ago. Per pt, the pain radiates down his right leg. He describes the pain as waxing and waning, severe, throbbing pain that is exacerbated by eating. Pt state the pain tonight was so severe he had trouble ambulating. He notes associated weight loss and constipation. Pt had a BM today and state sit was brownish-black. He states he has lost 35 pounds in the past 6 months, 15 of which was in the past month. Pt has not been seen by his GI doctor since 08/17 because he is relocating to a different practice. He had a CT done in 08/17 which showed inflammation. Pt was put on prednisone at that time with some relief; per pt, as the prednisone tapered down it became less effective. Pt denies any vomiting, fever, hematochezia or any other associated symptoms.    The history is provided by the patient. No language interpreter was used.   Past Medical History:  Diagnosis Date  . Crohn's disease [facility])    Patient Active Problem List   Diagnosis Date Noted  . Crohn's colitis, with abscess ([place]) 03/16/2016   Past Surgical History:  Procedure Laterality Date  . bowel abscess from crohns       Home Medications    Prior to Admission medications   Medication Sig Start Date End Date Taking? Authorizing Provider  acetaminophen (TYLENOL) 500 MG tablet Take 1,000 mg by  mouth every 6 (six) hours as needed for moderate pain.   Yes Historical Provider, MD  azaTHIOprine (IMURAN) 50 MG tablet Take 2 tablets by mouth daily. 02/18/16  Yes Historical Provider, MD  predniSONE (DELTASONE) 10 MG tablet Take 5 mg by mouth daily.  03/02/16  Yes Historical Provider, MD    Family History History reviewed. No pertinent family history.  Social History Social History  Substance Use Topics  . Smoking status: Never Smoker  . Smokeless tobacco: Never Used  . Alcohol use No   Allergies   Patient has no known allergies.  Review of Systems Review of Systems 10 systems reviewed and all are negative for acute change except as noted in the HPI.    Physical Exam Updated Vital Signs BP 113/69   Pulse 86   Temp 98.3 F (36.8 C) (Oral)   Resp 16   Ht 6' (1.829 m)   Wt 78.5 kg   SpO2 99%   BMI 23.46 kg/m   Physical Exam  Constitutional: He is oriented to person, place, and time. He appears well-developed and well-nourished. No distress.  Nontoxic appearing and pleasant.  HENT:  Head: Normocephalic and atraumatic.  Eyes: Conjunctivae and EOM are normal. No scleral icterus.  Neck: Normal range of motion.  Cardiovascular: Normal rate, regular rhythm and intact distal pulses.   Pulmonary/Chest: Effort normal. No respiratory distress. He has no wheezes.  Respirations even and unlabored  Abdominal: Soft. He exhibits no distension and no  mass. There is tenderness. There is guarding.  Right lower quadrant tenderness with voluntary guarding. No abdominal distention or rigidity. Bowel sounds normoactive.  Musculoskeletal: Normal range of motion.  Neurological: He is alert and oriented to person, place, and time. He exhibits normal muscle tone. Coordination normal.  Skin: Skin is warm and dry. No rash noted. He is not diaphoretic. No erythema. No pallor.  Psychiatric: He has a normal mood and affect. His behavior is normal.  Nursing note and vitals reviewed.   ED  Treatments / Results  DIAGNOSTIC STUDIES:  Oxygen Saturation is 97% on RA, normal by my interpretation.    COORDINATION OF CARE:  12:39 AM Discussed treatment plan with pt at bedside and pt agreed to plan.   Labs (all labs ordered are listed, but only abnormal results are displayed) Labs Reviewed  CBC WITH DIFFERENTIAL/PLATELET - Abnormal; Notable for the following:       Result Value   WBC 12.3 (*)    RBC 4.05 (*)    Hemoglobin 10.6 (*)    HCT 33.0 (*)    Platelets 483 (*)    Neutro Abs 9.3 (*)    Monocytes Absolute 1.2 (*)    All other components within normal limits  COMPREHENSIVE METABOLIC PANEL - Abnormal; Notable for the following:    Calcium 8.8 (*)    Albumin 3.3 (*)    AST 13 (*)    ALT 12 (*)    All other components within normal limits  URINALYSIS, ROUTINE W REFLEX MICROSCOPIC - Abnormal; Notable for the following:    Color, Urine AMBER (*)    Hgb urine dipstick SMALL (*)    Ketones, ur 5 (*)    Protein, ur 30 (*)    Bacteria, UA RARE (*)    Squamous Epithelial / LPF 0-5 (*)    All other components within normal limits  LIPASE, BLOOD    EKG  EKG Interpretation None       Radiology Ct Abdomen Pelvis W Contrast  Result Date: 03/16/2016 CLINICAL DATA:  Abdominal pain. Right lower quadrant pain. Weight loss. Crohn's disease. EXAM: CT ABDOMEN AND PELVIS WITH CONTRAST TECHNIQUE: Multidetector CT imaging of the abdomen and pelvis was performed using the standard protocol following bolus administration of intravenous contrast. CONTRAST:  150m ISOVUE-300 IOPAMIDOL (ISOVUE-300) INJECTION 61% COMPARISON:  [facility] FINDINGS: Lower chest: The lung bases are clear. Hepatobiliary: No focal liver abnormality is seen. No gallstones, gallbladder wall thickening, or biliary dilatation. Pancreas: No ductal dilatation or inflammation. Spleen: Normal in size without focal abnormality. Adrenals/Urinary Tract: Normal adrenal  glands. Symmetric renal enhancement without hydronephrosis. Urinary bladder is physiologically distended. Stomach/Bowel: Wall thickening involving the terminal ileum and cecum with moderate adjacent mesenteric inflammation consistent with active Crohn's. There multiple adjacent irregular fluid collections with peripheral enhancement in the right lower quadrant consistent with abscess. These involve both the adjacent fat and right iliopsoas muscle. A discrete collection anteriorly measures 2.8 x 1.7 cm. This may be contiguous with the more posterior collection measuring 2.5 x 4.7 cm, which involves the iliopsoas muscle. The appendix is tentatively identified containing intraluminal fluid with similar stranding to the adjacent inflamed cecum and ascending colon. No other bowel wall thickening to suggest additional sites of active Crohn's. Moderate stool in the more distal colon. Vascular/Lymphatic: No significant vascular findings are present. No enlarged abdominal or pelvic lymph nodes. Reproductive: Prostate is unremarkable. Other: Right lower quadrant abscesses as described. No pelvic free fluid.  No free air. Musculoskeletal: Heterogeneous edema of the right iliopsoas muscle with small intramuscular abscess as described. Mild heterogeneous enlargement of the distal iliopsoas likely myositis. There are no acute or suspicious osseous abnormalities. IMPRESSION: Active Crohn's involving the terminal ileum and cecum. There are adjacent irregular abscesses in the right lower quadrant involving the adjacent fat and iliopsoas muscle. Fluid collections are irregular in shape, and measure approximately 2.8 and 4.7 cm respectively. Electronically Signed   By: Jeb Levering M.D.   On: 03/16/2016 03:24    Procedures Procedures (including critical care time)  Medications Ordered in ED Medications  piperacillin-tazobactam (ZOSYN) IVPB 3.375 g (not administered)  methylPREDNISolone sodium succinate (SOLU-MEDROL) 125  mg/2 mL injection 125 mg (not administered)  sodium chloride 0.9 % bolus 1,000 mL (1,000 mLs Intravenous New Bag/Given 03/16/16 0103)  ketorolac (TORADOL) 30 MG/ML injection 15 mg (15 mg Intravenous Given 03/16/16 0103)  iopamidol (ISOVUE-300) 61 % injection 30 mL (30 mLs Oral Contrast Given 03/16/16 0110)  iopamidol (ISOVUE-300) 61 % injection 100 mL (100 mLs Intravenous Contrast Given 03/16/16 0243)     Initial Impression / Assessment and Plan / ED Course  I have reviewed the triage vital signs and the nursing notes.  Pertinent labs & imaging results that were available during my care of the patient were reviewed by me and considered in my medical decision making (see chart for details).     36 year old male presents to the emergency department for persistent right lower quadrant abdominal pain. He has a leukocytosis of 12.3, though this may be due to long-term prednisone use. Pain correlates with active Crohn's flare of the terminal ileum and cecum with abscesses in the right lower quadrant. There is evidence of myositis to the iliopsoas muscle which is likely the cause of patient's pain with ambulation. Patient has been hemodynamically stable since arrival. Steroids and IV antibiotics initiated. Will admit for further management and possible IR drainage of abscesses. Case discussed with [facility] Tamala Julian of [facility].   Final Clinical Impressions(s) / ED Diagnoses   Final diagnoses:  Crohn's colitis, with abscess [facility])    New Prescriptions New Prescriptions   No medications on file    I personally performed the services described in this documentation, which was scribed in my presence. The recorded information has been reviewed and is accurate.      Antonietta Breach, PA-C 03/16/16 [place], MD 03/16/16 [phone]

## 2016-03-16 NOTE — Progress Notes (Signed)
Initial Nutrition Assessment  DOCUMENTATION CODES:   Not applicable  INTERVENTION:  Diet advancement per GI/MD.  Provide Ensure Enlive po BID with diet advancement, each supplement provides 350 kcal and 20 grams of protein.  Encouraged intake of adequate calories and protein due to increased losses during flare.  NUTRITION DIAGNOSIS:   Unintentional weight loss related to chronic illness, altered GI function (Crohn's disease) as evidenced by per patient/family report, 17 percent weight loss over 7 months.  GOAL:   Patient will meet greater than or equal to 90% of their needs  MONITOR:   Labs, Diet advancement, I & O's, Weight trends  REASON FOR ASSESSMENT:   Malnutrition Screening Tool    ASSESSMENT:   36 y.o. male with medical history significant of Crohn's colitis on azathioprine originally diagnosed in 2015; who presents with waxing and waning sharp right lower quadrant abdominal pain., Recent Crohn's flare in August 2017, still on prednisone taper, admitted for Crohn's exacerbation with abscess.  Spoke with patient at bedside. Patient reports his appetite and intake are good but he is still losing weight. Patient reports eating his usual intake of 2-3 meals per day with snacks between meals. He reports he would occasionally skip meals with the abdominal pain, but most days he ate all meals. Patient did not provide further information on intake. Reports he avoids spicy and acidic foods during flares. Patient reports he is no longer having abdominal pain. Also denies nausea or constipation/diarrhea.  Patient reports he was 208 lbs in June 2017. He has lost 35 lbs (17% body weight) over 7 months, which is significant for time frame.   Medications reviewed and include: pantoprazole, Zosyn, NS @ 100 ml/hr.   Labs reviewed: Albumin 3.3, AST 13, ALT 12.   Nutrition-Focused physical exam completed. Findings are no fat depletion, no muscle depletion, and no edema.   Diet Order:   Diet NPO time specified Except for: Sips with Meds  Skin:  Reviewed, no issues  Last BM:  03/15/2016  Height:   Ht Readings from Last 1 Encounters:  03/16/16 6' (1.829 m)    Weight:   Wt Readings from Last 1 Encounters:  03/16/16 173 lb (78.5 kg)    Ideal Body Weight:  80.9 kg  BMI:  Body mass index is 23.46 kg/m.  Estimated Nutritional Needs:   Kcal:  2125-2480 (MSJ x 1.2-1.4)  Protein:  95-110 grams (1.2-1.4 grams/kg)  Fluid:  2.3 L/day (30 ml/kg)  EDUCATION NEEDS:   No education needs identified at this time  Willey Blade, MS, RD, LDN Pager: [phone] After Hours Pager: [phone]

## 2016-03-16 NOTE — Progress Notes (Signed)
PROGRESS NOTE                                                                                                                                                                                                             Patient Demographics:    Larry [name], is a 36 y.o. male, DOB - 20-Jun-1980, ZOX:[id number]  Admit date - 03/16/2016   Admitting Physician Norval Morton, MD  Outpatient Primary MD for the patient is Pcp Not In System  LOS - 0   Chief Complaint  Patient presents with  . Abdominal Pain       Brief Narrative   36 y.o. male with medical history significant of Crohn's colitis on azathioprine originally diagnosed in 2015; who presents with waxing and waning sharp right lower quadrant abdominal pain., Recent Crohn's flare in August 2017, still on prednisone taper, admitted for Crohn's exacerbation with abscess.   Subjective:    Ephrem Carrick today has, No headache, No chest pain, Reports abdominal pain - No Nausea, vomiting or diarrhea.   Assessment  & Plan :    Principal Problem:   Crohn's colitis, with abscess ([place]) Active Problems:   Leukocytosis   Anemia   Crohn's colitis, with abscess: - Patient with known history of Crohn's disease, CT abdomen pelvis significant for Crohn's colitis with abscess. - Continue with IV Zosyn, GI consulted. - Meanwhile continue nothing by mouth, IV fluids, IV antibiotics, ready received IV Solu-Medrol in ED, will defer her IV steroids for GI. - Renal surgery and IR consulted by GI for possible drain of abscess.    Anemia:  - Initial hemoglobin on to be 10.6. Most likely anemia of chronic disease - Continue to monitor    Thrombocytosis: likely reactive      Code Status : Full  Family Communication  : family at bedside  Disposition Plan  : home when stable  Consults  :  GI  Procedures  : none  DVT Prophylaxis  :  Lovenox -SCDs   Lab Results  Component Value Date   PLT 483  (H) 03/16/2016    Antibiotics  :    Anti-infectives    Start     Dose/Rate Route Frequency Ordered Stop   03/16/16 1400  piperacillin-tazobactam (ZOSYN) IVPB 3.375 g     3.375 g 12.5 mL/hr over 240 Minutes Intravenous  Every 8 hours 03/16/16 0548     03/16/16 0415  piperacillin-tazobactam (ZOSYN) IVPB 3.375 g     3.375 g 12.5 mL/hr over 240 Minutes Intravenous  Once 03/16/16 0411 03/16/16 0844        Objective:   Vitals:   03/15/16 2340 03/16/16 0334 03/16/16 0528  BP: 118/71 113/69 116/65  Pulse: 82 86 83  Resp: 16 16 18   Temp: 98.3 F (36.8 C)  98.6 F (37 C)  TempSrc: Oral  Oral  SpO2: 97% 99% 100%  Weight: 78.5 kg (173 lb)  78.5 kg (173 lb)  Height: 6' (1.829 m)  6' (1.829 m)    Wt Readings from Last 3 Encounters:  03/16/16 78.5 kg (173 lb)     Intake/Output Summary (Last 24 hours) at 03/16/16 1143 Last data filed at 03/16/16 0710  Gross per 24 hour  Intake               50 ml  Output              450 ml  Net             -400 ml     Physical Exam  Awake Alert, Oriented X 3,  Supple Neck,No JVD,  Symmetrical Chest wall movement, Good air movement bilaterally, CTAB RRR,No Gallops,Rubs or new Murmurs, No Parasternal Heave +ve B.Sounds, Abd Soft, mild tenderness, , No rebound - guarding or rigidity. No Cyanosis, Clubbing or edema, No new Rash or bruise      Data Review:    CBC  Recent Labs Lab 03/16/16 0101  WBC 12.3*  HGB 10.6*  HCT 33.0*  PLT 483*  MCV 81.5  MCH 26.2  MCHC 32.1  RDW 14.1  LYMPHSABS 1.6  MONOABS 1.2*  EOSABS 0.1  BASOSABS 0.0    Chemistries   Recent Labs Lab 03/16/16 0101  NA 135  K 3.7  CL 102  CO2 25  GLUCOSE 96  BUN 12  CREATININE 0.78  CALCIUM 8.8*  AST 13*  ALT 12*  ALKPHOS 39  BILITOT 0.4   ------------------------------------------------------------------------------------------------------------------ No results for input(s): CHOL, HDL, LDLCALC, TRIG, CHOLHDL, LDLDIRECT in the last 72  hours.  No results found for: HGBA1C ------------------------------------------------------------------------------------------------------------------ No results for input(s): TSH, T4TOTAL, T3FREE, THYROIDAB in the last 72 hours.  Invalid input(s): FREET3 ------------------------------------------------------------------------------------------------------------------ No results for input(s): VITAMINB12, FOLATE, FERRITIN, TIBC, IRON, RETICCTPCT in the last 72 hours.  Coagulation profile No results for input(s): INR, PROTIME in the last 168 hours.  No results for input(s): DDIMER in the last 72 hours.  Cardiac Enzymes No results for input(s): CKMB, TROPONINI, MYOGLOBIN in the last 168 hours.  Invalid input(s): CK ------------------------------------------------------------------------------------------------------------------ No results found for: BNP  Inpatient Medications  Scheduled Meds: . azaTHIOprine  100 mg Oral Daily  . pantoprazole (PROTONIX) IV  40 mg Intravenous Q12H  . piperacillin-tazobactam (ZOSYN)  IV  3.375 g Intravenous Q8H   Continuous Infusions: . sodium chloride 100 mL/hr at 03/16/16 0632   PRN Meds:.albuterol, morphine injection, ondansetron **OR** ondansetron (ZOFRAN) IV  Micro Results No results found for this or any previous visit (from the past 240 hour(s)).  Radiology Reports Ct Abdomen Pelvis W Contrast  Result Date: 03/16/2016 CLINICAL DATA:  Abdominal pain. Right lower quadrant pain. Weight loss. Crohn's disease. EXAM: CT ABDOMEN AND PELVIS WITH CONTRAST TECHNIQUE: Multidetector CT imaging of the abdomen and pelvis was performed using the standard protocol following bolus administration of intravenous contrast. CONTRAST:  127m ISOVUE-300 IOPAMIDOL (ISOVUE-300) INJECTION 61%  COMPARISON:  CT abdomen/ pelvis 10/28/2015 at [facility]. FINDINGS: Lower chest: The lung bases are clear. Hepatobiliary: No focal liver abnormality is seen. No  gallstones, gallbladder wall thickening, or biliary dilatation. Pancreas: No ductal dilatation or inflammation. Spleen: Normal in size without focal abnormality. Adrenals/Urinary Tract: Normal adrenal glands. Symmetric renal enhancement without hydronephrosis. Urinary bladder is physiologically distended. Stomach/Bowel: Wall thickening involving the terminal ileum and cecum with moderate adjacent mesenteric inflammation consistent with active Crohn's. There multiple adjacent irregular fluid collections with peripheral enhancement in the right lower quadrant consistent with abscess. These involve both the adjacent fat and right iliopsoas muscle. A discrete collection anteriorly measures 2.8 x 1.7 cm. This may be contiguous with the more posterior collection measuring 2.5 x 4.7 cm, which involves the iliopsoas muscle. The appendix is tentatively identified containing intraluminal fluid with similar stranding to the adjacent inflamed cecum and ascending colon. No other bowel wall thickening to suggest additional sites of active Crohn's. Moderate stool in the more distal colon. Vascular/Lymphatic: No significant vascular findings are present. No enlarged abdominal or pelvic lymph nodes. Reproductive: Prostate is unremarkable. Other: Right lower quadrant abscesses as described. No pelvic free fluid. No free air. Musculoskeletal: Heterogeneous edema of the right iliopsoas muscle with small intramuscular abscess as described. Mild heterogeneous enlargement of the distal iliopsoas likely myositis. There are no acute or suspicious osseous abnormalities. IMPRESSION: Active Crohn's involving the terminal ileum and cecum. There are adjacent irregular abscesses in the right lower quadrant involving the adjacent fat and iliopsoas muscle. Fluid collections are irregular in shape, and measure approximately 2.8 and 4.7 cm respectively. Electronically Signed   By: Jeb Levering M.D.   On: 03/16/2016 03:24     [facility], Caty Tessler  M.D on 03/16/2016 at 11:43 AM  Between 7am to 7pm - Pager - [phone]  After 7pm go to [email or web address] - password [facility]  [facility] -  Office  [phone]

## 2016-03-16 NOTE — ED Notes (Signed)
Bed: WA07 Expected date:  Expected time:  Means of arrival:  Comments: 

## 2016-03-16 NOTE — ED Notes (Signed)
Pt transported to CT ?

## 2016-03-17 DIAGNOSIS — K50814 Crohn's disease of both small and large intestine with abscess: Principal | ICD-10-CM

## 2016-03-17 LAB — HEPATITIS B SURFACE ANTIBODY, QUANTITATIVE: Hepatitis B-Post: 4.1 m[IU]/mL — ABNORMAL LOW (ref >9.9)

## 2016-03-17 LAB — BASIC METABOLIC PANEL
Anion gap: 7 (ref 5–15)
BUN: 14 mg/dL (ref 6–20)
CO2: 24 mmol/L (ref 22–32)
CREATININE: 0.87 mg/dL (ref 0.61–1.24)
Calcium: 8.7 mg/dL — ABNORMAL LOW (ref 8.9–10.3)
Chloride: 105 mmol/L (ref 101–111)
GFR calc Af Amer: >60 mL/min (ref >60)
GLUCOSE: 84 mg/dL (ref 65–99)
POTASSIUM: 3.9 mmol/L (ref 3.5–5.1)
Sodium: 136 mmol/L (ref 135–145)

## 2016-03-17 LAB — CBC
HCT: 29.6 % — ABNORMAL LOW (ref 39.0–52.0)
Hemoglobin: 9.3 g/dL — ABNORMAL LOW (ref 13.0–17.0)
MCH: 25.8 pg — AB (ref 26.0–34.0)
MCHC: 31.4 g/dL (ref 30.0–36.0)
MCV: 82 fL (ref 78.0–100.0)
PLATELETS: 445 10*3/uL — AB (ref 150–400)
RBC: 3.61 MIL/uL — AB (ref 4.22–5.81)
RDW: 14 % (ref 11.5–15.5)
WBC: 12.1 10*3/uL — ABNORMAL HIGH (ref 4.0–10.5)

## 2016-03-17 LAB — C-REACTIVE PROTEIN: CRP: 5.6 mg/dL — ABNORMAL HIGH (ref <1.0)

## 2016-03-17 LAB — HEPATITIS B SURFACE ANTIGEN: Hepatitis B Surface Ag: NEGATIVE

## 2016-03-17 LAB — MAGNESIUM: Magnesium: 1.9 mg/dL (ref 1.7–2.4)

## 2016-03-17 LAB — PHOSPHORUS: Phosphorus: 4.2 mg/dL (ref 2.5–4.6)

## 2016-03-17 LAB — GLUCOSE, CAPILLARY: GLUCOSE-CAPILLARY: 89 mg/dL (ref 65–99)

## 2016-03-17 MED ORDER — SODIUM CHLORIDE 0.9% FLUSH: 10.0000 mL | INTRAVENOUS | Status: DC | PRN

## 2016-03-17 MED ORDER — SACCHAROMYCES BOULARDII 250 MG PO CAPS
250.0000 mg | ORAL_CAPSULE | Freq: Two times a day (BID) | ORAL | Status: DC
  Administered 2016-03-17 – 2016-03-27 (×21): 250 mg via ORAL
  Filled 2016-03-17 (×21): qty 1

## 2016-03-17 MED ORDER — SODIUM CHLORIDE 0.9 % IV SOLN
INTRAVENOUS | Status: AC
  Administered 2016-03-18: 05:00:00 via INTRAVENOUS

## 2016-03-17 MED ORDER — INSULIN ASPART 100 UNIT/ML ~~LOC~~ SOLN
0.0000 [IU] | Freq: Four times a day (QID) | SUBCUTANEOUS | Status: DC
  Administered 2016-03-19: 1 [IU] via SUBCUTANEOUS

## 2016-03-17 MED ORDER — SODIUM CHLORIDE 0.9% FLUSH
10.0000 mL | Freq: Two times a day (BID) | INTRAVENOUS | Status: DC
  Administered 2016-03-18 – 2016-03-27 (×8): 10 mL

## 2016-03-17 MED ORDER — FAT EMULSION 20 % IV EMUL
120.0000 mL | INTRAVENOUS | Status: AC
  Administered 2016-03-17: 120 mL via INTRAVENOUS
  Filled 2016-03-17: qty 250

## 2016-03-17 MED ORDER — PANTOPRAZOLE SODIUM 40 MG PO TBEC
40.0000 mg | DELAYED_RELEASE_TABLET | Freq: Every day | ORAL | Status: DC
  Administered 2016-03-17 – 2016-03-27 (×11): 40 mg via ORAL
  Filled 2016-03-17 (×11): qty 1

## 2016-03-17 MED ORDER — ENOXAPARIN SODIUM 40 MG/0.4ML ~~LOC~~ SOLN
40.0000 mg | SUBCUTANEOUS | Status: DC
  Administered 2016-03-18: 40 mg via SUBCUTANEOUS
  Filled 2016-03-17 (×5): qty 0.4

## 2016-03-17 MED ORDER — TRACE MINERALS CR-CU-MN-SE-ZN 10-1000-500-60 MCG/ML IV SOLN
INTRAVENOUS | Status: AC
  Administered 2016-03-17: 19:00:00 via INTRAVENOUS
  Filled 2016-03-17: qty 960

## 2016-03-17 NOTE — Progress Notes (Signed)
Peripherally Inserted Central Catheter/Midline Placement  The IV Nurse has discussed with the patient and/or persons authorized to consent for the patient, the purpose of this procedure and the potential benefits and risks involved with this procedure.  The benefits include less needle sticks, lab draws from the catheter, and the patient may be discharged home with the catheter. Risks include, but not limited to, infection, bleeding, blood clot (thrombus formation), and puncture of an artery; nerve damage and irregular heartbeat and possibility to perform a PICC exchange if needed/ordered by physician.  Alternatives to this procedure were also discussed.  Bard Power PICC patient education guide, fact sheet on infection prevention and patient information card has been provided to patient /or left at bedside.    PICC/Midline Placement Documentation  PICC Double Lumen 03/17/16 PICC Right Cephalic 40 cm 0 cm (Active)  Indication for Insertion or Continuance of Line Administration of hyperosmolar/irritating solutions (i.e. TPN, Vancomycin, etc.) 03/17/2016  6:28 PM  Exposed Catheter (cm) 0 cm 03/17/2016  6:28 PM  Site Assessment Clean;Dry;Intact 03/17/2016  6:28 PM  Lumen #1 Status Flushed;Saline locked;Blood return noted 03/17/2016  6:28 PM  Lumen #2 Status Flushed;Saline locked;Blood return noted 03/17/2016  6:28 PM  Dressing Type Transparent 03/17/2016  6:28 PM  Dressing Status Clean;Dry;Intact;Antimicrobial disc in place 03/17/2016  6:28 PM  Line Care Connections checked and tightened 03/17/2016  6:28 PM  Line Adjustment (NICU/IV Team Only) No 03/17/2016  6:28 PM  Dressing Intervention New dressing 03/17/2016  6:28 PM  Dressing Change Due 03/24/16 03/17/2016  6:28 PM       [name] 03/17/2016, 6:29 PM

## 2016-03-17 NOTE — Progress Notes (Signed)
Progress Note   Subjective  Patient states he feels significantly improved today. Has been NPO on antibiotics. He denies any significant abdominal pain, abdomen soft.    Objective   Vital signs in last 24 hours: Temp:  [98.6 F (37 C)-99 F (37.2 C)] 99 F (37.2 C) (01/20 0502) Pulse Rate:  [70-79] 79 (01/20 0502) Resp:  [16-18] 18 (01/20 0502) BP: (95-112)/(54-66) 112/66 (01/20 0502) SpO2:  [98 %-99 %] 98 % (01/20 0502) Last BM Date: 03/15/16 General:    male in NAD Heart:  Regular rate and rhythm; no murmurs Lungs: Respirations even and unlabored, lungs CTA bilaterally Abdomen:  Soft, nontender and nondistended.  Extremities:  Without edema. Neurologic:  Alert and oriented,  grossly normal neurologically. Psych:  Cooperative. Normal mood and affect.  Intake/Output from previous day: 01/19 0701 - 01/20 0700 In: 1100 [I.V.:1100] Out: 1450 [Urine:1450] Intake/Output this shift: No intake/output data recorded.  Lab Results:  Recent Labs  03/16/16 0101 03/17/16 0351  WBC 12.3* 12.1*  HGB 10.6* 9.3*  HCT 33.0* 29.6*  PLT 483* 445*   BMET  Recent Labs  03/16/16 0101 03/17/16 0351  NA 135 136  K 3.7 3.9  CL 102 105  CO2 25 24  GLUCOSE 96 84  BUN 12 14  CREATININE 0.78 0.87  CALCIUM 8.8* 8.7*   LFT  Recent Labs  03/16/16 0101  PROT 7.8  ALBUMIN 3.3*  AST 13*  ALT 12*  ALKPHOS 39  BILITOT 0.4   PT/INR No results for input(s): LABPROT, INR in the last 72 hours.  Studies/Results: Ct Abdomen Pelvis W Contrast  Result Date: 03/16/2016 CLINICAL DATA:  Abdominal pain. Right lower quadrant pain. Weight loss. Crohn's disease. EXAM: CT ABDOMEN AND PELVIS WITH CONTRAST TECHNIQUE: Multidetector CT imaging of the abdomen and pelvis was performed using the standard protocol following bolus administration of intravenous contrast. CONTRAST:  195m ISOVUE-300 IOPAMIDOL (ISOVUE-300) INJECTION 61% COMPARISON:  [facility] FINDINGS: Lower chest: The lung bases are clear. Hepatobiliary: No focal liver abnormality is seen. No gallstones, gallbladder wall thickening, or biliary dilatation. Pancreas: No ductal dilatation or inflammation. Spleen: Normal in size without focal abnormality. Adrenals/Urinary Tract: Normal adrenal glands. Symmetric renal enhancement without hydronephrosis. Urinary bladder is physiologically distended. Stomach/Bowel: Wall thickening involving the terminal ileum and cecum with moderate adjacent mesenteric inflammation consistent with active Crohn's. There multiple adjacent irregular fluid collections with peripheral enhancement in the right lower quadrant consistent with abscess. These involve both the adjacent fat and right iliopsoas muscle. A discrete collection anteriorly measures 2.8 x 1.7 cm. This may be contiguous with the more posterior collection measuring 2.5 x 4.7 cm, which involves the iliopsoas muscle. The appendix is tentatively identified containing intraluminal fluid with similar stranding to the adjacent inflamed cecum and ascending colon. No other bowel wall thickening to suggest additional sites of active Crohn's. Moderate stool in the more distal colon. Vascular/Lymphatic: No significant vascular findings are present. No enlarged abdominal or pelvic lymph nodes. Reproductive: Prostate is unremarkable. Other: Right lower quadrant abscesses as described. No pelvic free fluid. No free air. Musculoskeletal: Heterogeneous edema of the right iliopsoas muscle with small intramuscular abscess as described. Mild heterogeneous enlargement of the distal iliopsoas likely myositis. There are no acute or suspicious osseous abnormalities. IMPRESSION: Active Crohn's involving the terminal ileum and cecum. There are adjacent irregular abscesses in the right lower quadrant involving the adjacent fat and iliopsoas muscle. Fluid collections are irregular in shape,  and measure approximately 2.8  and 4.7 cm respectively. Electronically Signed   By: Jeb Levering M.D.   On: 03/16/2016 03:24       Assessment / Plan:   36 y/o male with perforating Crohn's disease of the small bowel and right colon. CT scan showing multiple abscesses, per IR not amenable to drainage. Overall improved with bowel rest and antibiotics thus far. Long term his severity of illness warrants biologic therapy and likely combination immunosuppresive therapy / more aggressive management of Crohns which we discussed once recovered from this acute issue.   Recommend at this time: - continue NPO, bowel rest, and antibiotics - starting TPN is reasonable if thought he may have a prolonged course of bowel rest with possible surgery - reimage abdomen early next week - if abscesses fail to improve he will likely need surgery - agree with lovenox for DVT prophylaxis  We will continue to follow, please call with questions.   [place] Cellar, MD [facility] Pager [phone]

## 2016-03-17 NOTE — Progress Notes (Signed)
[facility] Progress Note     Subjective: Patient reports quick sharp pain that occurred in suprapubic area randomly throughout night. Stated improved with pain medications. Has been passing gas but denies BM since 03/15/16. Denies fever, chills, N/V/D, and SOB. States he is ultimately feeling better since admission.  Objective: Vital signs in last 24 hours: Temp:  [98.6 F (37 C)-99 F (37.2 C)] 99 F (37.2 C) (01/20 0502) Pulse Rate:  [70-79] 79 (01/20 0502) Resp:  [16-18] 18 (01/20 0502) BP: (95-112)/(54-66) 112/66 (01/20 0502) SpO2:  [98 %-99 %] 98 % (01/20 0502) Last BM Date: 03/15/16  Intake/Output from previous day: 01/19 0701 - 01/20 0700 In: 1100 [I.V.:1100] Out: 1450 [Urine:1450] Intake/Output this shift: No intake/output data recorded.  PE: Gen:  Alert, NAD, pleasant Card:  RRR, no M/G/R heard Pulm:  CTAB, no W/R/R, breathing unlabored Abd: Soft, ND, slight tenderness to palpation LLQ +BS, no HSM palpated Ext:  No erythema, edema, or tenderness  Lab Results:   Recent Labs  03/16/16 0101 03/17/16 0351  WBC 12.3* 12.1*  HGB 10.6* 9.3*  HCT 33.0* 29.6*  PLT 483* 445*   BMET  Recent Labs  03/16/16 0101 03/17/16 0351  NA 135 136  K 3.7 3.9  CL 102 105  CO2 25 24  GLUCOSE 96 84  BUN 12 14  CREATININE 0.78 0.87  CALCIUM 8.8* 8.7*   PT/INR No results for input(s): LABPROT, INR in the last 72 hours. CMP     Component Value Date/Time   NA 136 03/17/2016 0351   K 3.9 03/17/2016 0351   CL 105 03/17/2016 0351   CO2 24 03/17/2016 0351   GLUCOSE 84 03/17/2016 0351   BUN 14 03/17/2016 0351   CREATININE 0.87 03/17/2016 0351   CALCIUM 8.7 (L) 03/17/2016 0351   PROT 7.8 03/16/2016 0101   ALBUMIN 3.3 (L) 03/16/2016 0101   AST 13 (L) 03/16/2016 0101   ALT 12 (L) 03/16/2016 0101   ALKPHOS 39 03/16/2016 0101   BILITOT 0.4 03/16/2016 0101   GFRNONAA >60 03/17/2016 0351   GFRAA >60 03/17/2016 0351   Lipase     Component Value Date/Time    LIPASE 19 03/16/2016 0101       Studies/Results: Ct Abdomen Pelvis W Contrast  Result Date: 03/16/2016 CLINICAL DATA:  Abdominal pain. Right lower quadrant pain. Weight loss. Crohn's disease. EXAM: CT ABDOMEN AND PELVIS WITH CONTRAST TECHNIQUE: Multidetector CT imaging of the abdomen and pelvis was performed using the standard protocol following bolus administration of intravenous contrast. CONTRAST:  174m ISOVUE-300 IOPAMIDOL (ISOVUE-300) INJECTION 61% COMPARISON:  [facility] FINDINGS: Lower chest: The lung bases are clear. Hepatobiliary: No focal liver abnormality is seen. No gallstones, gallbladder wall thickening, or biliary dilatation. Pancreas: No ductal dilatation or inflammation. Spleen: Normal in size without focal abnormality. Adrenals/Urinary Tract: Normal adrenal glands. Symmetric renal enhancement without hydronephrosis. Urinary bladder is physiologically distended. Stomach/Bowel: Wall thickening involving the terminal ileum and cecum with moderate adjacent mesenteric inflammation consistent with active Crohn's. There multiple adjacent irregular fluid collections with peripheral enhancement in the right lower quadrant consistent with abscess. These involve both the adjacent fat and right iliopsoas muscle. A discrete collection anteriorly measures 2.8 x 1.7 cm. This may be contiguous with the more posterior collection measuring 2.5 x 4.7 cm, which involves the iliopsoas muscle. The appendix is tentatively identified containing intraluminal fluid with similar stranding to the adjacent inflamed cecum and ascending colon. No other bowel wall thickening  to suggest additional sites of active Crohn's. Moderate stool in the more distal colon. Vascular/Lymphatic: No significant vascular findings are present. No enlarged abdominal or pelvic lymph nodes. Reproductive: Prostate is unremarkable. Other: Right lower quadrant abscesses as described. No  pelvic free fluid. No free air. Musculoskeletal: Heterogeneous edema of the right iliopsoas muscle with small intramuscular abscess as described. Mild heterogeneous enlargement of the distal iliopsoas likely myositis. There are no acute or suspicious osseous abnormalities. IMPRESSION: Active Crohn's involving the terminal ileum and cecum. There are adjacent irregular abscesses in the right lower quadrant involving the adjacent fat and iliopsoas muscle. Fluid collections are irregular in shape, and measure approximately 2.8 and 4.7 cm respectively. Electronically Signed   By: Jeb Levering M.D.   On: 03/16/2016 03:24    Anti-infectives: Anti-infectives    Start     Dose/Rate Route Frequency Ordered Stop   03/16/16 1400  piperacillin-tazobactam (ZOSYN) IVPB 3.375 g     3.375 g 12.5 mL/hr over 240 Minutes Intravenous Every 8 hours 03/16/16 0548     03/16/16 0415  piperacillin-tazobactam (ZOSYN) IVPB 3.375 g     3.375 g 12.5 mL/hr over 240 Minutes Intravenous  Once 03/16/16 0411 03/16/16 0844       Assessment/Plan Crohn's colitis with RLQ fluid collection - RLQ pain and swelling x 4 weeks, weight loss - WBC 12.1 - Hgb 9.3 from 10.6 - empiric Zosyn for intra-abdominal abscesses - medical management of active crohn's per GI  - IR consulted and patient not amenable to perc drain  FEN: NPO, IVF ID: Zosyn VTE: Lovenox, SCD's  Plan: No acute surgical needs. Having bowel function. Continue to treat with empiric IV abx and bowel rest for weekend. Will re-scan to assess for improvement of abscesses. If no improvement, will consider surgery next week. General surgery will follow.   LOS: 1 day    Dorene Sorrow , [facility] 03/17/2016, 7:41 AM Pager: [phone] Consults: [phone] Mon-Fri 7:00 am-4:30 pm Sat-Sun 7:00 am-11:30 am

## 2016-03-17 NOTE — Progress Notes (Signed)
PROGRESS NOTE                                                                                                                                                                                                             Patient Demographics:    Larry [name], is a 36 y.o. male, DOB - 11-Jan-1981, RDE:[id number]  Admit date - 03/16/2016   Admitting Physician Norval Morton, MD  Outpatient Primary MD for the patient is Pcp Not In System  LOS - 1   Chief Complaint  Patient presents with  . Abdominal Pain       Brief Narrative   36 y.o. male with medical history significant of Crohn's colitis on azathioprine originally diagnosed in 2015; who presents with waxing and waning sharp right lower quadrant abdominal pain., Recent Crohn's flare in August 2017, still on prednisone taper, admitted for Crohn's exacerbation with abscess.   Subjective:    Larry [name] today has, No headache, No chest pain, Reports abdominal pain - No Nausea, vomiting or diarrhea.   Assessment  & Plan :    Principal Problem:   Crohn's colitis, with abscess ([place]) Active Problems:   Crohn's colitis ([place])   Leukocytosis   Anemia   Crohn's colitis, with abscess - Patient with known history of Crohn's disease, CT abdomen pelvis significant for Crohn's colitis with abscess. - Continue with IV Zosyn. - GI input greatly appreciated, continue nothing by mouth, bowel rest, and IV Zosyn, anticipated prolonged course of bowel rest, so we'll insert a PICC line and start TPN today. - Neurosurgery input regarding abscesses evident appreciated, not amendable to drainage by IR, tinea with IV Zosyn, 3 image next week, if no improvement or likely will need surgery.  Anemia - Initial hemoglobin on to be 10.6. Most likely anemia of chronic disease - Continue to monitor    Thrombocytosis  likely reactive      Code Status : Full  Family Communication  : None at bedside  Disposition  Plan  : home when stable  Consults  :  GI  Procedures  : none  DVT Prophylaxis  :  Lovenox -SCDs   Lab Results  Component Value Date   PLT 445 (H) 03/17/2016    Antibiotics  :    Anti-infectives    Start     Dose/Rate Route Frequency  Ordered Stop   03/16/16 1400  piperacillin-tazobactam (ZOSYN) IVPB 3.375 g     3.375 g 12.5 mL/hr over 240 Minutes Intravenous Every 8 hours 03/16/16 0548     03/16/16 0415  piperacillin-tazobactam (ZOSYN) IVPB 3.375 g     3.375 g 12.5 mL/hr over 240 Minutes Intravenous  Once 03/16/16 0411 03/16/16 0844        Objective:   Vitals:   03/16/16 0528 03/16/16 1356 03/16/16 2042 03/17/16 0502  BP: 116/65 (!) 95/54 (!) 97/55 112/66  Pulse: 83 70 77 79  Resp: 18 16 16 18   Temp: 98.6 F (37 C) 98.6 F (37 C) 98.7 F (37.1 C) 99 F (37.2 C)  TempSrc: Oral Oral Oral Oral  SpO2: 100% 98% 99% 98%  Weight: 78.5 kg (173 lb)     Height: 6' (1.829 m)       Wt Readings from Last 3 Encounters:  03/16/16 78.5 kg (173 lb)     Intake/Output Summary (Last 24 hours) at 03/17/16 1333 Last data filed at 03/17/16 0522  Gross per 24 hour  Intake              700 ml  Output             1000 ml  Net             -300 ml     Physical Exam  Awake Alert, Oriented X 3,  Supple Neck,No JVD,  Symmetrical Chest wall movement, Good air movement bilaterally, CTAB RRR,No Gallops,Rubs or new Murmurs, No Parasternal Heave +ve B.Sounds, Abd Soft, mild tenderness, , No rebound - guarding or rigidity. No Cyanosis, Clubbing or edema, No new Rash or bruise      Data Review:    CBC  Recent Labs Lab 03/16/16 0101 03/17/16 0351  WBC 12.3* 12.1*  HGB 10.6* 9.3*  HCT 33.0* 29.6*  PLT 483* 445*  MCV 81.5 82.0  MCH 26.2 25.8*  MCHC 32.1 31.4  RDW 14.1 14.0  LYMPHSABS 1.6  --   MONOABS 1.2*  --   EOSABS 0.1  --   BASOSABS 0.0  --     Chemistries   Recent Labs Lab 03/16/16 0101 03/17/16 0350 03/17/16 0351  NA 135  --  136  K 3.7  --  3.9  CL  102  --  105  CO2 25  --  24  GLUCOSE 96  --  84  BUN 12  --  14  CREATININE 0.78  --  0.87  CALCIUM 8.8*  --  8.7*  MG  --  1.9  --   AST 13*  --   --   ALT 12*  --   --   ALKPHOS 39  --   --   BILITOT 0.4  --   --    ------------------------------------------------------------------------------------------------------------------ No results for input(s): CHOL, HDL, LDLCALC, TRIG, CHOLHDL, LDLDIRECT in the last 72 hours.  No results found for: HGBA1C ------------------------------------------------------------------------------------------------------------------ No results for input(s): TSH, T4TOTAL, T3FREE, THYROIDAB in the last 72 hours.  Invalid input(s): FREET3 ------------------------------------------------------------------------------------------------------------------ No results for input(s): VITAMINB12, FOLATE, FERRITIN, TIBC, IRON, RETICCTPCT in the last 72 hours.  Coagulation profile No results for input(s): INR, PROTIME in the last 168 hours.  No results for input(s): DDIMER in the last 72 hours.  Cardiac Enzymes No results for input(s): CKMB, TROPONINI, MYOGLOBIN in the last 168 hours.  Invalid input(s): CK ------------------------------------------------------------------------------------------------------------------ No results found for: BNP  Inpatient Medications  Scheduled Meds: . [START ON  03/18/2016] enoxaparin (LOVENOX) injection  40 mg Subcutaneous Q24H  . insulin aspart  0-9 Units Subcutaneous Q6H  . pantoprazole  40 mg Oral Daily  . piperacillin-tazobactam (ZOSYN)  IV  3.375 g Intravenous Q8H  . saccharomyces boulardii  250 mg Oral BID   Continuous Infusions: . sodium chloride 100 mL/hr at 03/17/16 0107  . sodium chloride    . Marland KitchenTPN (CLINIMIX-E) Adult     And  . fat emulsion     PRN Meds:.albuterol, morphine injection, ondansetron **OR** ondansetron (ZOFRAN) IV  Micro Results No results found for this or any previous visit (from the  past 240 hour(s)).  Radiology Reports Ct Abdomen Pelvis W Contrast  Result Date: 03/16/2016 CLINICAL DATA:  Abdominal pain. Right lower quadrant pain. Weight loss. Crohn's disease. EXAM: CT ABDOMEN AND PELVIS WITH CONTRAST TECHNIQUE: Multidetector CT imaging of the abdomen and pelvis was performed using the standard protocol following bolus administration of intravenous contrast. CONTRAST:  149m ISOVUE-300 IOPAMIDOL (ISOVUE-300) INJECTION 61% COMPARISON:  [facility] FINDINGS: Lower chest: The lung bases are clear. Hepatobiliary: No focal liver abnormality is seen. No gallstones, gallbladder wall thickening, or biliary dilatation. Pancreas: No ductal dilatation or inflammation. Spleen: Normal in size without focal abnormality. Adrenals/Urinary Tract: Normal adrenal glands. Symmetric renal enhancement without hydronephrosis. Urinary bladder is physiologically distended. Stomach/Bowel: Wall thickening involving the terminal ileum and cecum with moderate adjacent mesenteric inflammation consistent with active Crohn's. There multiple adjacent irregular fluid collections with peripheral enhancement in the right lower quadrant consistent with abscess. These involve both the adjacent fat and right iliopsoas muscle. A discrete collection anteriorly measures 2.8 x 1.7 cm. This may be contiguous with the more posterior collection measuring 2.5 x 4.7 cm, which involves the iliopsoas muscle. The appendix is tentatively identified containing intraluminal fluid with similar stranding to the adjacent inflamed cecum and ascending colon. No other bowel wall thickening to suggest additional sites of active Crohn's. Moderate stool in the more distal colon. Vascular/Lymphatic: No significant vascular findings are present. No enlarged abdominal or pelvic lymph nodes. Reproductive: Prostate is unremarkable. Other: Right lower quadrant abscesses as described. No pelvic free fluid. No  free air. Musculoskeletal: Heterogeneous edema of the right iliopsoas muscle with small intramuscular abscess as described. Mild heterogeneous enlargement of the distal iliopsoas likely myositis. There are no acute or suspicious osseous abnormalities. IMPRESSION: Active Crohn's involving the terminal ileum and cecum. There are adjacent irregular abscesses in the right lower quadrant involving the adjacent fat and iliopsoas muscle. Fluid collections are irregular in shape, and measure approximately 2.8 and 4.7 cm respectively. Electronically Signed   By: MJeb LeveringM.D.   On: 03/16/2016 03:24     Nelta Caudill M.D on 03/17/2016 at 1:33 PM  Between 7am to 7pm - Pager - [phone] After 7pm go to [email or web address] - password [email or web address] -  Office  [phone]

## 2016-03-17 NOTE — Progress Notes (Signed)
PHARMACY - ADULT TOTAL PARENTERAL NUTRITION CONSULT NOTE   Pharmacy Consult for TPN Indication: Crohn's disease on bowel rest with multiple abscesses   Patient Measurements: Height: 6' (182.9 cm) Weight: 173 lb (78.5 kg) IBW/kg (Calculated) : 77.6 TPN AdjBW (KG): 78.5 Body mass index is 23.46 kg/m. Usual Weight: 95kg (7 mo ago)  Insulin Requirements: none  Current Nutrition: NPO  IVF: NS at 100 ml/hr  Central access: 1/20 IV expects to be placed today TPN start date: 1/20  ASSESSMENT                                                                                                     HPI:  67 YOM with history of Crohn's disease presents 1/19 to [facility] ED with c/o abd pain.  CT reveals active Crohn's and RLQ abscesses and fluid collection. These are not amendable to IR drainage.  GI has stated it was reasonable to start TPN since will be NPO and will have follow- scans week of 1/22 to determine if will need surgical intervention  Significant events:   Today:    Glucose - no PMH of DM  Electrolytes - WNL  Renal -WNL  LFTs - no issues  TGs - check in am  Prealbumin - check in am  NUTRITIONAL GOALS                                                                                             RD recs: 2125 - 2480 kcals, 95-110 gm protein Clinimix 5/20 at a goal rate of 18m/hr + 20% fat emulsion at 234mhr over 12h to provide: 96g/day protein, 2169Kcal/day.  PLAN                                                                                                                         At 1800 today: Once PICC placed and placement confirmed:   Start Clinimix E 5/20 at 4068mr.  20% fat emulsion at 38m76m over 12h (advance to 20ml14mover 12h as tolerates)  Plan to advance as tolerated to the goal rate.  TPN to contain standard multivitamins and trace elements every other day.  This is due to national backorder of trace  elements   Reduce IVF to 11m/hr.  Add sensitive SSI  q6h .   TPN lab panels on Mondays & Thursdays.  F/u daily.  DDoreene Eland PharmD, BCPS.   Pager: [phone]/20/2018 9:53 AM

## 2016-03-18 LAB — COMPREHENSIVE METABOLIC PANEL
ALT: 9 U/L — ABNORMAL LOW (ref 17–63)
AST: 11 U/L — ABNORMAL LOW (ref 15–41)
Albumin: 2.7 g/dL — ABNORMAL LOW (ref 3.5–5.0)
Alkaline Phosphatase: 31 U/L — ABNORMAL LOW (ref 38–126)
Anion gap: 7 (ref 5–15)
BUN: 12 mg/dL (ref 6–20)
CO2: 26 mmol/L (ref 22–32)
Calcium: 8.3 mg/dL — ABNORMAL LOW (ref 8.9–10.3)
Chloride: 102 mmol/L (ref 101–111)
Creatinine, Ser: 0.77 mg/dL (ref 0.61–1.24)
GFR calc Af Amer: >60 mL/min (ref >60)
GFR calc non Af Amer: >60 mL/min (ref >60)
Glucose, Bld: 99 mg/dL (ref 65–99)
Potassium: 3.5 mmol/L (ref 3.5–5.1)
Sodium: 135 mmol/L (ref 135–145)
Total Bilirubin: 0.6 mg/dL (ref 0.3–1.2)
Total Protein: 6.6 g/dL (ref 6.5–8.1)

## 2016-03-18 LAB — CBC
HCT: 28.2 % — ABNORMAL LOW (ref 39.0–52.0)
Hemoglobin: 8.8 g/dL — ABNORMAL LOW (ref 13.0–17.0)
MCH: 25.1 pg — ABNORMAL LOW (ref 26.0–34.0)
MCHC: 31.2 g/dL (ref 30.0–36.0)
MCV: 80.6 fL (ref 78.0–100.0)
Platelets: 411 10*3/uL — ABNORMAL HIGH (ref 150–400)
RBC: 3.5 MIL/uL — ABNORMAL LOW (ref 4.22–5.81)
RDW: 14.3 % (ref 11.5–15.5)
WBC: 12.2 10*3/uL — ABNORMAL HIGH (ref 4.0–10.5)

## 2016-03-18 LAB — DIFFERENTIAL
Basophils Absolute: 0 10*3/uL (ref 0.0–0.1)
Basophils Relative: 0 %
Eosinophils Absolute: 0 10*3/uL (ref 0.0–0.7)
Eosinophils Relative: 0 %
Lymphocytes Relative: 10 %
Lymphs Abs: 1.2 10*3/uL (ref 0.7–4.0)
Monocytes Absolute: 1 10*3/uL (ref 0.1–1.0)
Monocytes Relative: 9 %
Neutro Abs: 10 10*3/uL — ABNORMAL HIGH (ref 1.7–7.7)
Neutrophils Relative %: 81 %

## 2016-03-18 LAB — PHOSPHORUS: Phosphorus: 3.3 mg/dL (ref 2.5–4.6)

## 2016-03-18 LAB — GLUCOSE, CAPILLARY
Glucose-Capillary: 108 mg/dL — ABNORMAL HIGH (ref 65–99)
Glucose-Capillary: 114 mg/dL — ABNORMAL HIGH (ref 65–99)
Glucose-Capillary: 118 mg/dL — ABNORMAL HIGH (ref 65–99)
Glucose-Capillary: 122 mg/dL — ABNORMAL HIGH (ref 65–99)

## 2016-03-18 LAB — PREALBUMIN: Prealbumin: 13.6 mg/dL — ABNORMAL LOW (ref 18–38)

## 2016-03-18 LAB — TRIGLYCERIDES: Triglycerides: 98 mg/dL (ref <150)

## 2016-03-18 LAB — MAGNESIUM: Magnesium: 1.8 mg/dL (ref 1.7–2.4)

## 2016-03-18 MED ORDER — M.V.I. ADULT IV INJ
INTRAVENOUS | Status: AC
  Administered 2016-03-18: 17:00:00 via INTRAVENOUS
  Filled 2016-03-18: qty 1440

## 2016-03-18 MED ORDER — FAT EMULSION 20 % IV EMUL: 240.0000 mL | INTRAVENOUS | Status: DC

## 2016-03-18 MED ORDER — FAT EMULSION 20 % IV EMUL
240.0000 mL | INTRAVENOUS | Status: AC
  Administered 2016-03-18: 240 mL via INTRAVENOUS
  Filled 2016-03-18: qty 250

## 2016-03-18 MED ORDER — SODIUM CHLORIDE 0.9 % IV SOLN
INTRAVENOUS | Status: AC
  Administered 2016-03-18 (×2): via INTRAVENOUS

## 2016-03-18 MED ORDER — CLINIMIX E/DEXTROSE (5/20) 5 % IV SOLN: INTRAVENOUS | Status: DC

## 2016-03-18 NOTE — Progress Notes (Signed)
[facility] Progress Note     Subjective: Patient reports he is generally feeling better. Reports occasional episodes of shooting pain down legs but reports this pain is less severe than previous bouts. States he is passing gas, but no BMs since prior to admission. Denies N/V, current abdominal pain, and SOB.   Objective: Vital signs in last 24 hours: Temp:  [97.6 F (36.4 C)-99.1 F (37.3 C)] 98.9 F (37.2 C) (01/21 0600) Pulse Rate:  [80-85] 80 (01/21 0600) Resp:  [13-18] 13 (01/21 0600) BP: (100-125)/(61-65) 125/65 (01/21 0600) SpO2:  [98 %-99 %] 99 % (01/21 0600) Last BM Date: 03/15/16  Intake/Output from previous day: 01/20 0701 - 01/21 0700 In: 1038 [I.V.:969; IV Piggyback:69] Out: -  Intake/Output this shift: No intake/output data recorded.  PE: Gen:  Alert, NAD, pleasant Card:  RRR, no M/G/R heard Pulm:  CTAB, no W/R/R, breathing unlabored Abd: Soft, ND, slight tenderness to palpation RLQ +BS, no HSM palpated Ext:  No erythema, edema, or tenderness  Lab Results:   Recent Labs  03/16/16 0101 03/17/16 0351  WBC 12.3* 12.1*  HGB 10.6* 9.3*  HCT 33.0* 29.6*  PLT 483* 445*   BMET  Recent Labs  03/16/16 0101 03/17/16 0351  NA 135 136  K 3.7 3.9  CL 102 105  CO2 25 24  GLUCOSE 96 84  BUN 12 14  CREATININE 0.78 0.87  CALCIUM 8.8* 8.7*   PT/INR No results for input(s): LABPROT, INR in the last 72 hours. CMP     Component Value Date/Time   NA 136 03/17/2016 0351   K 3.9 03/17/2016 0351   CL 105 03/17/2016 0351   CO2 24 03/17/2016 0351   GLUCOSE 84 03/17/2016 0351   BUN 14 03/17/2016 0351   CREATININE 0.87 03/17/2016 0351   CALCIUM 8.7 (L) 03/17/2016 0351   PROT 7.8 03/16/2016 0101   ALBUMIN 3.3 (L) 03/16/2016 0101   AST 13 (L) 03/16/2016 0101   ALT 12 (L) 03/16/2016 0101   ALKPHOS 39 03/16/2016 0101   BILITOT 0.4 03/16/2016 0101   GFRNONAA >60 03/17/2016 0351   GFRAA >60 03/17/2016 0351   Lipase     Component Value  Date/Time   LIPASE 19 03/16/2016 0101       Studies/Results: No results found.  Anti-infectives: Anti-infectives    Start     Dose/Rate Route Frequency Ordered Stop   03/16/16 1400  piperacillin-tazobactam (ZOSYN) IVPB 3.375 g     3.375 g 12.5 mL/hr over 240 Minutes Intravenous Every 8 hours 03/16/16 0548     03/16/16 0415  piperacillin-tazobactam (ZOSYN) IVPB 3.375 g     3.375 g 12.5 mL/hr over 240 Minutes Intravenous  Once 03/16/16 0411 03/16/16 0844       Assessment/Plan  Crohn's colitis with RLQ fluid collection - RLQ pain and swelling x 4 weeks, weight loss - WBC 12.1 - Hgb 9.3 from 10.6 - empiric Zosyn for intra-abdominal abscesses - medical management of active crohn's per GI  - IR consulted and patient not amenable to perc drain  FEN: NPO, IVF, TPN ID: Zosyn VTE: Lovenox, SCD's  Plan: No acute surgical needs. Having bowel function. Continue to treat with empiric IV abx and bowel rest for weekend. Will continue to watch WBC as it is still slightly elevated at 12.1 on 03/17/16. Will re-scan early this coming week to assess for improvement of abscesses. If no improvement, will consider surgery next week. General surgery will follow.   LOS: 2 days  Dorene Sorrow , PA-S2 [facility] 03/18/2016, 7:30 AM Pager: [phone] Consults: [phone] Mon-Fri 7:00 am-4:30 pm Sat-Sun 7:00 am-11:30 am

## 2016-03-18 NOTE — Progress Notes (Signed)
PHARMACY - ADULT TOTAL PARENTERAL NUTRITION CONSULT NOTE   Pharmacy Consult for TPN Indication: Crohn's disease on bowel rest with multiple abscesses   Patient Measurements: Height: 6' (182.9 cm) Weight: 173 lb (78.5 kg) IBW/kg (Calculated) : 77.6 TPN AdjBW (KG): 78.5 Body mass index is 23.46 kg/m. Usual Weight: 95kg (7 mo ago)  Insulin Requirements: no SSI required since start of TPN last evening  Current Nutrition: NPO  IVF: NS at 60 ml/hr  Central access: 1/20 IV expects to be placed today TPN start date: 1/20  ASSESSMENT                                                                                                     HPI:  45 YOM with history of Crohn's disease presents 1/19 to [facility] ED with c/o abd pain.  CT reveals active Crohn's and RLQ abscesses and fluid collection. These are not amendable to IR drainage.  GI has stated it was reasonable to start TPN since will be NPO and will have follow- scans week of 1/22 to determine if will need surgical intervention  Significant events:   Today:    Glucose - no PMH of DM. CBGs controlled  Electrolytes - WNL, corr Ca WNL  Renal -WNL  LFTs - No issues  TGs - pending  Prealbumin - pending  NUTRITIONAL GOALS                                                                                             RD recs: 2125 - 2480 kcals, 95-110 gm protein Clinimix 5/20 at a goal rate of 13m/hr + 20% fat emulsion at 257mhr over 12h to provide: 96g/day protein, 2169Kcal/day.  PLAN                                                                                                                         At 1800 today:  Advance Clinimix E 5/20 to 6062mr (goal rate will be 77m31m)  20% fat emulsion at 20ml53mover 12h   Plan to advance as tolerated to the goal rate.  TPN to contain standard multivitamins and trace elements every other day.  This is due to national backorder of  trace elements   Reduce IVF to 73m/hr.  Continue  sensitive SSI q6h at this time, if not requiring coverage with TPN at goal then check less frequently .   TPN lab panels on Mondays & Thursdays.  F/u daily.  DDoreene Eland PharmD, BCPS.   Pager: [phone]/21/2018 9:05 AM

## 2016-03-18 NOTE — Progress Notes (Signed)
      Progress Note   Subjective  Patient did well overnight, reports improved pain and overall improved from admission. No fevers. PICC line placed, TPN started.   Objective   Vital signs in last 24 hours: Temp:  [97.6 F (36.4 C)-99.1 F (37.3 C)] 98.9 F (37.2 C) (01/21 0600) Pulse Rate:  [80-85] 80 (01/21 0600) Resp:  [13-18] 13 (01/21 0600) BP: (100-125)/(61-65) 125/65 (01/21 0600) SpO2:  [98 %-99 %] 99 % (01/21 0600) Last BM Date: 03/15/16 General:    male in NAD Heart:  Regular rate and rhythm; no murmurs Lungs: Respirations even and unlabored, lungs CTA bilaterally Abdomen:  Soft, mild tenderness to palpation and nondistended.  Extremities:  Without edema. Neurologic:  Alert and oriented,  grossly normal neurologically. Psych:  Cooperative. Normal mood and affect.  Intake/Output from previous day: 01/20 0701 - 01/21 0700 In: 1038 [I.V.:969; IV Piggyback:69] Out: -  Intake/Output this shift: No intake/output data recorded.  Lab Results:  Recent Labs  03/16/16 0101 03/17/16 0351 03/18/16 0800  WBC 12.3* 12.1* 12.2*  HGB 10.6* 9.3* 8.8*  HCT 33.0* 29.6* 28.2*  PLT 483* 445* 411*   BMET  Recent Labs  03/16/16 0101 03/17/16 0351 03/18/16 0800  NA 135 136 135  K 3.7 3.9 3.5  CL 102 105 102  CO2 25 24 26   GLUCOSE 96 84 99  BUN 12 14 12   CREATININE 0.78 0.87 0.77  CALCIUM 8.8* 8.7* 8.3*   LFT  Recent Labs  03/18/16 0800  PROT 6.6  ALBUMIN 2.7*  AST 11*  ALT 9*  ALKPHOS 31*  BILITOT 0.6   PT/INR No results for input(s): LABPROT, INR in the last 72 hours.  Studies/Results: No results found.     Assessment / Plan:   35 y/o male with perforating Crohn's disease of the small bowel and right colon. CT scan showing multiple abscesses, per IR not amenable to drainage. Overall improved with bowel rest and antibiotics thus far but mild leukocytosis persists. Long term his severity of illness warrants biologic therapy and likely combination  immunosuppresive therapy / more aggressive management of Crohns which we discussed, once recovered from this acute issue. For now plan on continue bowel rest, antibiotics, and on TPN.  Recommend at this time: - continue NPO, bowel rest, and antibiotics. TPN started - reimage abdomen early next week, perhaps Tuesday - if abscesses fail to improve he will likely need surgery, hopefully this is not the case - agree with lovenox for DVT prophylaxis  We will continue to follow, please call with questions. Dr. Henrene Pastor to assume inpatient GI care of this patient tomorrow.  [place] Cellar, MD [facility] Pager [phone]

## 2016-03-18 NOTE — Progress Notes (Signed)
PROGRESS NOTE                                                                                                                                                                                                             Patient Demographics:    Larry [name], is a 36 y.o. male, DOB - [birth year], SUO:[id number]  Admit date - 03/16/2016   Admitting Physician Norval Morton, MD  Outpatient Primary MD for the patient is Pcp Not In System  LOS - 2   Chief Complaint  Patient presents with  . Abdominal Pain       Brief Narrative   36 y.o. male with medical history significant of Crohn's colitis on azathioprine originally diagnosed in 2015; who presents with waxing and waning sharp right lower quadrant abdominal pain., Recent Crohn's flare in August 2017, still on prednisone taper, admitted for Crohn's exacerbation with abscess.   Subjective:    Larry [name] today has, No headache, No chest pain, Reports abdominal pain - No Nausea, vomiting or diarrhea.   Assessment  & Plan :    Principal Problem:   Crohn's colitis, with abscess ([place]) Active Problems:   Crohn's colitis ([place])   Leukocytosis   Anemia   Crohn's colitis, with abscess - Patient with known history of Crohn's disease, CT abdomen pelvis significant for Crohn's colitis with abscess. - Continue with IV Zosyn. - GI input greatly appreciated, continue nothing by mouth, bowel rest, and IV Zosyn, anticipated prolonged course of bowel rest,The PICC line inserted 1/20, and started on TPN. - surgery input regarding abscesses evident appreciated, not amendable to drainage by IR, cont with IV Zosyn, 3repeat imaging tomorrow or day after, if no improvement or likely will need surgery.  Anemia - Initial hemoglobin on to be 10.6. Trending down secondary to diuresis Most likely anemia of chronic disease - Continue to monitor    Thrombocytosis - likely reactive      Code Status :  Full  Family Communication  : None at bedside  Disposition Plan  : home when stable  Consults  :  GI, Gen surgery  Procedures  : none  DVT Prophylaxis  :  Lovenox -SCDs   Lab Results  Component Value Date   PLT 411 (H) 03/18/2016    Antibiotics  :    Anti-infectives    Start  Dose/Rate Route Frequency Ordered Stop   03/16/16 1400  piperacillin-tazobactam (ZOSYN) IVPB 3.375 g     3.375 g 12.5 mL/hr over 240 Minutes Intravenous Every 8 hours 03/16/16 0548     03/16/16 0415  piperacillin-tazobactam (ZOSYN) IVPB 3.375 g     3.375 g 12.5 mL/hr over 240 Minutes Intravenous  Once 03/16/16 0411 03/16/16 0844        Objective:   Vitals:   03/17/16 0502 03/17/16 1412 03/17/16 2015 03/18/16 0600  BP: 112/66 100/61 119/61 125/65  Pulse: 79 83 85 80  Resp: 18 18 16 13   Temp: 99 F (37.2 C) 97.6 F (36.4 C) 99.1 F (37.3 C) 98.9 F (37.2 C)  TempSrc: Oral Oral Oral Oral  SpO2: 98% 98% 98% 99%  Weight:      Height:        Wt Readings from Last 3 Encounters:  03/16/16 78.5 kg (173 lb)     Intake/Output Summary (Last 24 hours) at 03/18/16 1415 Last data filed at 03/18/16 1340  Gross per 24 hour  Intake             1088 ml  Output                0 ml  Net             1088 ml     Physical Exam  Awake Alert, Oriented X 3,  Supple Neck,No JVD,  Symmetrical Chest wall movement, Good air movement bilaterally, CTAB RRR,No Gallops,Rubs or new Murmurs, No Parasternal Heave +ve B.Sounds, Abd Soft, mild tenderness, , No rebound - guarding or rigidity. No Cyanosis, Clubbing or edema, No new Rash or bruise      Data Review:    CBC  Recent Labs Lab 03/16/16 0101 03/17/16 0351 03/18/16 0800  WBC 12.3* 12.1* 12.2*  HGB 10.6* 9.3* 8.8*  HCT 33.0* 29.6* 28.2*  PLT 483* 445* 411*  MCV 81.5 82.0 80.6  MCH 26.2 25.8* 25.1*  MCHC 32.1 31.4 31.2  RDW 14.1 14.0 14.3  LYMPHSABS 1.6  --  1.2  MONOABS 1.2*  --  1.0  EOSABS 0.1  --  0.0  BASOSABS 0.0  --  0.0     Chemistries   Recent Labs Lab 03/16/16 0101 03/17/16 0350 03/17/16 0351 03/18/16 0800  NA 135  --  136 135  K 3.7  --  3.9 3.5  CL 102  --  105 102  CO2 25  --  24 26  GLUCOSE 96  --  84 99  BUN 12  --  14 12  CREATININE 0.78  --  0.87 0.77  CALCIUM 8.8*  --  8.7* 8.3*  MG  --  1.9  --  1.8  AST 13*  --   --  11*  ALT 12*  --   --  9*  ALKPHOS 39  --   --  31*  BILITOT 0.4  --   --  0.6   ------------------------------------------------------------------------------------------------------------------  Recent Labs  03/18/16 0800  TRIG 98    No results found for: HGBA1C ------------------------------------------------------------------------------------------------------------------ No results for input(s): TSH, T4TOTAL, T3FREE, THYROIDAB in the last 72 hours.  Invalid input(s): FREET3 ------------------------------------------------------------------------------------------------------------------ No results for input(s): VITAMINB12, FOLATE, FERRITIN, TIBC, IRON, RETICCTPCT in the last 72 hours.  Coagulation profile No results for input(s): INR, PROTIME in the last 168 hours.  No results for input(s): DDIMER in the last 72 hours.  Cardiac Enzymes No results for input(s): CKMB, TROPONINI, MYOGLOBIN in  the last 168 hours.  Invalid input(s): CK ------------------------------------------------------------------------------------------------------------------ No results found for: BNP  Inpatient Medications  Scheduled Meds: . enoxaparin (LOVENOX) injection  40 mg Subcutaneous Q24H  . insulin aspart  0-9 Units Subcutaneous Q6H  . pantoprazole  40 mg Oral Daily  . piperacillin-tazobactam (ZOSYN)  IV  3.375 g Intravenous Q8H  . saccharomyces boulardii  250 mg Oral BID  . sodium chloride flush  10-40 mL Intracatheter Q12H   Continuous Infusions: . sodium chloride 60 mL/hr at 03/18/16 0500  . sodium chloride    . Marland KitchenTPN (CLINIMIX-E) Adult     And  . fat  emulsion    . Marland KitchenTPN (CLINIMIX-E) Adult 40 mL/hr at 03/17/16 1831   PRN Meds:.albuterol, morphine injection, ondansetron **OR** ondansetron (ZOFRAN) IV, sodium chloride flush  Micro Results No results found for this or any previous visit (from the past 240 hour(s)).  Radiology Reports Ct Abdomen Pelvis W Contrast  Result Date: 03/16/2016 CLINICAL DATA:  Abdominal pain. Right lower quadrant pain. Weight loss. Crohn's disease. EXAM: CT ABDOMEN AND PELVIS WITH CONTRAST TECHNIQUE: Multidetector CT imaging of the abdomen and pelvis was performed using the standard protocol following bolus administration of intravenous contrast. CONTRAST:  130m ISOVUE-300 IOPAMIDOL (ISOVUE-300) INJECTION 61% COMPARISON:  [facility] FINDINGS: Lower chest: The lung bases are clear. Hepatobiliary: No focal liver abnormality is seen. No gallstones, gallbladder wall thickening, or biliary dilatation. Pancreas: No ductal dilatation or inflammation. Spleen: Normal in size without focal abnormality. Adrenals/Urinary Tract: Normal adrenal glands. Symmetric renal enhancement without hydronephrosis. Urinary bladder is physiologically distended. Stomach/Bowel: Wall thickening involving the terminal ileum and cecum with moderate adjacent mesenteric inflammation consistent with active Crohn's. There multiple adjacent irregular fluid collections with peripheral enhancement in the right lower quadrant consistent with abscess. These involve both the adjacent fat and right iliopsoas muscle. A discrete collection anteriorly measures 2.8 x 1.7 cm. This may be contiguous with the more posterior collection measuring 2.5 x 4.7 cm, which involves the iliopsoas muscle. The appendix is tentatively identified containing intraluminal fluid with similar stranding to the adjacent inflamed cecum and ascending colon. No other bowel wall thickening to suggest additional sites of active Crohn's. Moderate stool in  the more distal colon. Vascular/Lymphatic: No significant vascular findings are present. No enlarged abdominal or pelvic lymph nodes. Reproductive: Prostate is unremarkable. Other: Right lower quadrant abscesses as described. No pelvic free fluid. No free air. Musculoskeletal: Heterogeneous edema of the right iliopsoas muscle with small intramuscular abscess as described. Mild heterogeneous enlargement of the distal iliopsoas likely myositis. There are no acute or suspicious osseous abnormalities. IMPRESSION: Active Crohn's involving the terminal ileum and cecum. There are adjacent irregular abscesses in the right lower quadrant involving the adjacent fat and iliopsoas muscle. Fluid collections are irregular in shape, and measure approximately 2.8 and 4.7 cm respectively. Electronically Signed   By: MJeb LeveringM.D.   On: 03/16/2016 03:24     Kersten Salmons M.D on 03/18/2016 at 2:15 PM  Between 7am to 7pm - Pager - [phone] After 7pm go to [email or web address] - password [email or web address] -  Office  [phone]

## 2016-03-18 NOTE — Progress Notes (Signed)
Nutrition Follow-up  INTERVENTION:   TPN per Pharmacy RD to continue to monitor  NUTRITION DIAGNOSIS:   Unintentional weight loss related to chronic illness, altered GI function (Crohn's disease) as evidenced by per patient/family report, percent weight loss.  Ongoing.  GOAL:   Patient will meet greater than or equal to 90% of their needs  Progressing.  MONITOR:   Labs, Weight trends, I & O's, Other (Comment) (TPN)  REASON FOR ASSESSMENT:   Consult New TPN/TNA  ASSESSMENT:   36 y.o. male with medical history significant of Crohn's colitis on azathioprine originally diagnosed in 2015; who presents with waxing and waning sharp right lower quadrant abdominal pain., Recent Crohn's flare in August 2017, still on prednisone taper, admitted for Crohn's exacerbation with abscess.  Initial assessment completed 1/19.  Pt now on TPN and bowel rest. Currently receiving Clinimix E 5/20 @ 40 ml/hr and ILE @ 10 ml/hr over 12 hours (provides 1084 kcal and 48 kcal).  Labs reviewed. Medications reviewed.   Plan per Pharmacy 1/21:  At 1800 today:  Advance Clinimix E 5/20 to 26m/hr (goal rate will be 869mhr)  20% fat emulsion at 2031mr over 12h   Plan to advance as tolerated to the goal rate.  Diet Order:  Diet NPO time specified Except for: Sips with Meds TPN (CLINIMIX-E) Adult TPN (CLINIMIX-E) Adult  Skin:  Reviewed, no issues  Last BM:  03/15/2016  Height:   Ht Readings from Last 1 Encounters:  03/16/16 6' (1.829 m)    Weight:   Wt Readings from Last 1 Encounters:  03/16/16 173 lb (78.5 kg)    Ideal Body Weight:  80.9 kg  BMI:  Body mass index is 23.46 kg/m.  Estimated Nutritional Needs:   Kcal:  2125-2480 (MSJ x 1.2-1.4)  Protein:  95-110 grams (1.2-1.4 grams/kg)  Fluid:  2.3 L/day (30 ml/kg)  EDUCATION NEEDS:   No education needs identified at this time  LinClayton BiblesS, RD, LDN Pager: [phone] Hours Pager: [phone]

## 2016-03-19 DIAGNOSIS — R935 Abnormal findings on diagnostic imaging of other abdominal regions, including retroperitoneum: Secondary | ICD-10-CM

## 2016-03-19 LAB — COMPREHENSIVE METABOLIC PANEL
ALBUMIN: 2.9 g/dL — AB (ref 3.5–5.0)
ALK PHOS: 30 U/L — AB (ref 38–126)
ALT: 8 U/L — ABNORMAL LOW (ref 17–63)
ANION GAP: 5 (ref 5–15)
AST: 12 U/L — ABNORMAL LOW (ref 15–41)
BILIRUBIN TOTAL: 0.4 mg/dL (ref 0.3–1.2)
BUN: 11 mg/dL (ref 6–20)
CALCIUM: 8.5 mg/dL — AB (ref 8.9–10.3)
CO2: 27 mmol/L (ref 22–32)
Chloride: 106 mmol/L (ref 101–111)
Creatinine, Ser: 0.74 mg/dL (ref 0.61–1.24)
Glucose, Bld: 107 mg/dL — ABNORMAL HIGH (ref 65–99)
POTASSIUM: 3.6 mmol/L (ref 3.5–5.1)
Sodium: 138 mmol/L (ref 135–145)
TOTAL PROTEIN: 7 g/dL (ref 6.5–8.1)

## 2016-03-19 LAB — CBC
HCT: 29.4 % — ABNORMAL LOW (ref 39.0–52.0)
Hemoglobin: 9.2 g/dL — ABNORMAL LOW (ref 13.0–17.0)
MCH: 25.1 pg — ABNORMAL LOW (ref 26.0–34.0)
MCHC: 31.3 g/dL (ref 30.0–36.0)
MCV: 80.1 fL (ref 78.0–100.0)
PLATELETS: 411 10*3/uL — AB (ref 150–400)
RBC: 3.67 MIL/uL — AB (ref 4.22–5.81)
RDW: 14 % (ref 11.5–15.5)
WBC: 10.8 10*3/uL — ABNORMAL HIGH (ref 4.0–10.5)

## 2016-03-19 LAB — MAGNESIUM: MAGNESIUM: 2 mg/dL (ref 1.7–2.4)

## 2016-03-19 LAB — GLUCOSE, CAPILLARY
GLUCOSE-CAPILLARY: 81 mg/dL (ref 65–99)
GLUCOSE-CAPILLARY: 133 mg/dL — AB (ref 65–99)
GLUCOSE-CAPILLARY: 111 mg/dL — AB (ref 65–99)
Glucose-Capillary: 105 mg/dL — ABNORMAL HIGH (ref 65–99)

## 2016-03-19 LAB — PHOSPHORUS: PHOSPHORUS: 3.9 mg/dL (ref 2.5–4.6)

## 2016-03-19 MED ORDER — SODIUM CHLORIDE 0.9 % IV SOLN
INTRAVENOUS | Status: DC
  Administered 2016-03-19 – 2016-03-26 (×5): via INTRAVENOUS

## 2016-03-19 MED ORDER — TRACE MINERALS CR-CU-MN-SE-ZN 10-1000-500-60 MCG/ML IV SOLN
INTRAVENOUS | Status: AC
  Administered 2016-03-19: 18:00:00 via INTRAVENOUS
  Filled 2016-03-19: qty 1920

## 2016-03-19 MED ORDER — FAT EMULSION 20 % IV EMUL
240.0000 mL | INTRAVENOUS | Status: AC
  Administered 2016-03-19: 240 mL via INTRAVENOUS
  Filled 2016-03-19: qty 250

## 2016-03-19 NOTE — Progress Notes (Signed)
PHARMACY - ADULT TOTAL PARENTERAL NUTRITION CONSULT NOTE   Pharmacy Consult for TPN Indication: Crohn's disease on bowel rest with multiple abscesses   Patient Measurements: Height: 6' (182.9 cm) Weight: 173 lb (78.5 kg) IBW/kg (Calculated) : 77.6 TPN AdjBW (KG): 78.5 Body mass index is 23.46 kg/m. Usual Weight: 95kg (7 mo ago)  Insulin Requirements: no SSI required since start of TPN last evening  Current Nutrition: NPO  IVF: NS at 40 ml/hr  Central access: PICC placed 1/20 TPN start date: 1/20  ASSESSMENT                                                                                                     HPI:  65 YOM with history of Crohn's disease presents 1/19 to [facility] ED with c/o abd pain.  CT reveals active Crohn's and RLQ abscesses and fluid collection. These are not amendable to IR drainage.  GI has stated it was reasonable to start TPN since will be NPO and will have follow- scans week of 1/22 to determine if will need surgical intervention  Significant events:   Today:    Glucose - no PMH of DM. CBGs controlled  Electrolytes - WNL, corr Ca WNL  Renal -WNL  LFTs - low  TGs - WNL 98 (1/21)  Prealbumin - 13.6 (1/21)  NUTRITIONAL GOALS                                                                                             RD recs: 2125 - 2480 kcals, 95-110 gm protein Clinimix 5/20 at a goal rate of 89m/hr + 20% fat emulsion at 277mhr over 12h to provide: 96g/day protein, 2169Kcal/day.  PLAN                                                                                                                         At 1800 today:  Advance Clinimix E 5/20 to 8052mr (goal)  20% fat emulsion at 95m67m over 12h   TPN to contain standard multivitamins and trace elements every other day.  This is due to national backorder of trace elements   Reduce IVF to 95ml46m  Continue sensitive SSI q6h at this time,  if not requiring coverage with TPN at goal then check  less frequently .   TPN lab panels on Mondays & Thursdays, check BMET, Mg & Phos in AM.  F/u daily.  Peggyann Juba, PharmD, BCPS Pager: [phone] 03/19/2016 7:58 AM

## 2016-03-19 NOTE — Care Management Note (Signed)
Case Management Note  Patient Details  Name: Larry [name] MRN: [id number] Date of Birth: [birth year]  Subjective/Objective:       36 yo admitted with Crohn's Terminal ileitis, with abscess             Action/Plan: Pt from home with spouse. CM will follow along and assist with DC needs.  Expected Discharge Date:                  Expected Discharge Plan:  Home/Self Care  In-House Referral:     Discharge planning Services  CM Consult  Post Acute Care Choice:    Choice offered to:     DME Arranged:    DME Agency:     HH Arranged:    HH Agency:     Status of Service:  In process, will continue to follow  If discussed at Long Length of Stay Meetings, dates discussed:    Additional CommentsLynnell Catalan, RN 03/19/2016, 2:56 PM  [phone]

## 2016-03-19 NOTE — Progress Notes (Signed)
Subjective: He is still having some pain on the right, but he seems to tolerate it well.  Not very hungry on TNA.    Objective: Vital signs in last 24 hours: Temp:  [98.3 F (36.8 C)-99.7 F (37.6 C)] 98.3 F (36.8 C) (01/22 0525) Pulse Rate:  [71-95] 71 (01/22 0525) Resp:  [12-16] 12 (01/22 0525) BP: (87-118)/(50-66) 118/66 (01/22 0525) SpO2:  [98 %-100 %] 100 % (01/22 0525) Last BM Date: 03/15/16 IV=? Urine 300 recorded TM 99.7 BP down some lat PM 87/50 x 1 Labs OK Malnutrition - Prealbumin 13.6 - 03/18/16 WBC down trending CT scan 03/16/16 - Wall thickening involving the terminal ileum and cecum with moderate adjacent mesenteric inflammation consistent with active Crohn's. There multiple adjacent irregular fluid collections with peripheral enhancement in the right lower quadrant consistent with abscess. These involve both the adjacent fat and right iliopsoas muscle. A discrete collection anteriorly measures 2.8 x 1.7 cm. This may be contiguous with the more posterior collection measuring 2.5 x 4.7 cm, which involves the iliopsoas muscle. The appendix is tentatively identified containing intraluminal fluid with similar stranding to the adjacent inflamed cecum and ascending colon. No other bowel wall thickening to suggest additional sites of active Crohn's  Intake/Output from previous day: 01/21 0701 - 01/22 0700 In: 50 [IV Piggyback:50] Out: 300 [Urine:300] Intake/Output this shift: No intake/output data recorded.  General appearance: alert, cooperative and no distress GI: soft, tender in the RLQ, not really very distended   Lab Results:   Recent Labs  03/18/16 0800 03/19/16 0530  WBC 12.2* 10.8*  HGB 8.8* 9.2*  HCT 28.2* 29.4*  PLT 411* 411*    BMET  Recent Labs  03/18/16 0800 03/19/16 0530  NA 135 138  K 3.5 3.6  CL 102 106  CO2 26 27  GLUCOSE 99 107*  BUN 12 11  CREATININE 0.77 0.74  CALCIUM 8.3* 8.5*   PT/INR No results for input(s):  LABPROT, INR in the last 72 hours.   Recent Labs Lab 03/16/16 0101 03/18/16 0800 03/19/16 0530  AST 13* 11* 12*  ALT 12* 9* 8*  ALKPHOS 39 31* 30*  BILITOT 0.4 0.6 0.4  PROT 7.8 6.6 7.0  ALBUMIN 3.3* 2.7* 2.9*     Lipase     Component Value Date/Time   LIPASE 19 03/16/2016 0101     Studies/Results: No results found. Prior to Admission medications   Medication Sig Start Date End Date Taking? Authorizing Provider  acetaminophen (TYLENOL) 500 MG tablet Take 1,000 mg by mouth every 6 (six) hours as needed for moderate pain.   Yes Historical Provider, MD  azaTHIOprine (IMURAN) 50 MG tablet Take 2 tablets by mouth daily. 02/18/16  Yes Historical Provider, MD  predniSONE (DELTASONE) 10 MG tablet Take 5 mg by mouth daily.  03/02/16  Yes Historical Provider, MD    Medications: . enoxaparin (LOVENOX) injection  40 mg Subcutaneous Q24H  . insulin aspart  0-9 Units Subcutaneous Q6H  . pantoprazole  40 mg Oral Daily  . piperacillin-tazobactam (ZOSYN)  IV  3.375 g Intravenous Q8H  . saccharomyces boulardii  250 mg Oral BID  . sodium chloride flush  10-40 mL Intracatheter Q12H   . sodium chloride 40 mL/hr at 03/18/16 2356  . Marland KitchenTPN (CLINIMIX-E) Adult 60 mL/hr at 03/18/16 1722    Assessment/Plan Crohn's colitis with fluid collections - terminal illeitis - not amenable to IR drain On Imuran/Prednisone at home FEN:  TNA/ice chips ID: Zosyn 03/15/16 =>> day 5 DVT:  Lovenox  Plan:  GI recommending repeat CT tomorrow.          LOS: 3 days    Eppie Barhorst 03/19/2016 [phone]

## 2016-03-19 NOTE — Progress Notes (Signed)
PROGRESS NOTE                                                                                                                                                                                                             Patient Demographics:    Fields Oros, is a 36 y.o. male, DOB - 21-Feb-1981, NIO:[id number]  Admit date - 03/16/2016   Admitting Physician Norval Morton, MD  Outpatient Primary MD for the patient is Pcp Not In System  LOS - 3   Chief Complaint  Patient presents with  . Abdominal Pain       Brief Narrative   36 y.o. male with medical history significant of Crohn's colitis on azathioprine originally diagnosed in 2015; who presents with waxing and waning sharp right lower quadrant abdominal pain., Recent Crohn's flare in August 2017, still on prednisone taper, admitted for Crohn's exacerbation with abscess.   Subjective:    Rayshun Kandler today has, No headache, No chest pain, Reports abdominal pain - No Nausea, vomiting or diarrhea.   Assessment  & Plan :    Principal Problem:   Crohn's colitis, with abscess ([place]) Active Problems:   Crohn's colitis ([place])   Leukocytosis   Anemia   Crohn's Terminal ileitis, with abscess - Patient with known history of Crohn's disease, CT abdomen pelvis significant for Crohn's colitis with abscess. - Continue with IV Zosyn. - GI input greatly appreciated, continue nothing by mouth, bowel rest, and IV Zosyn, anticipated prolonged course of bowel rest,PICC line inserted 1/20, and started on TPN. - surgery input regarding abscesses  appreciated, not amendable to drainage by IR, cont with IV Zosyn, repeat imaging tomorrow , if no improvement or likely will need surgery.  Anemia - Initial hemoglobin on to be 10.6. Trending down secondary to dilution,  Most likely anemia of chronic disease - Continue to monitor    Thrombocytosis - likely reactive      Code Status : Full  Family  Communication  : None at bedside  Disposition Plan  : home when stable  Consults  :  GI, Gen surgery  Procedures  : none  DVT Prophylaxis  :  Lovenox -SCDs   Lab Results  Component Value Date   PLT 411 (H) 03/19/2016    Antibiotics  :    Anti-infectives    Start  Dose/Rate Route Frequency Ordered Stop   03/16/16 1400  piperacillin-tazobactam (ZOSYN) IVPB 3.375 g     3.375 g 12.5 mL/hr over 240 Minutes Intravenous Every 8 hours 03/16/16 0548     03/16/16 0415  piperacillin-tazobactam (ZOSYN) IVPB 3.375 g     3.375 g 12.5 mL/hr over 240 Minutes Intravenous  Once 03/16/16 0411 03/16/16 0844        Objective:   Vitals:   03/18/16 1431 03/18/16 2050 03/18/16 2350 03/19/16 0525  BP: (!) 101/59 (!) 87/50 103/66 118/66  Pulse: 95 83  71  Resp: 16 14  12   Temp: 99.7 F (37.6 C) 99.5 F (37.5 C)  98.3 F (36.8 C)  TempSrc: Oral Oral  Oral  SpO2: 98% 98%  100%  Weight:      Height:        Wt Readings from Last 3 Encounters:  03/16/16 78.5 kg (173 lb)     Intake/Output Summary (Last 24 hours) at 03/19/16 1059 Last data filed at 03/19/16 0530  Gross per 24 hour  Intake               50 ml  Output              300 ml  Net             -250 ml     Physical Exam  Awake Alert, Oriented X 3,  Supple Neck,No JVD,  Symmetrical Chest wall movement, Good air movement bilaterally, CTAB RRR,No Gallops,Rubs or new Murmurs, No Parasternal Heave +ve B.Sounds, Abd Soft, mild tenderness, , No rebound - guarding or rigidity. No Cyanosis, Clubbing or edema, No new Rash or bruise      Data Review:    CBC  Recent Labs Lab 03/16/16 0101 03/17/16 0351 03/18/16 0800 03/19/16 0530  WBC 12.3* 12.1* 12.2* 10.8*  HGB 10.6* 9.3* 8.8* 9.2*  HCT 33.0* 29.6* 28.2* 29.4*  PLT 483* 445* 411* 411*  MCV 81.5 82.0 80.6 80.1  MCH 26.2 25.8* 25.1* 25.1*  MCHC 32.1 31.4 31.2 31.3  RDW 14.1 14.0 14.3 14.0  LYMPHSABS 1.6  --  1.2  --   MONOABS 1.2*  --  1.0  --   EOSABS 0.1  --   0.0  --   BASOSABS 0.0  --  0.0  --     Chemistries   Recent Labs Lab 03/16/16 0101 03/17/16 0350 03/17/16 0351 03/18/16 0800 03/19/16 0530  NA 135  --  136 135 138  K 3.7  --  3.9 3.5 3.6  CL 102  --  105 102 106  CO2 25  --  24 26 27   GLUCOSE 96  --  84 99 107*  BUN 12  --  14 12 11   CREATININE 0.78  --  0.87 0.77 0.74  CALCIUM 8.8*  --  8.7* 8.3* 8.5*  MG  --  1.9  --  1.8 2.0  AST 13*  --   --  11* 12*  ALT 12*  --   --  9* 8*  ALKPHOS 39  --   --  31* 30*  BILITOT 0.4  --   --  0.6 0.4   ------------------------------------------------------------------------------------------------------------------  Recent Labs  03/18/16 0800  TRIG 98    No results found for: HGBA1C ------------------------------------------------------------------------------------------------------------------ No results for input(s): TSH, T4TOTAL, T3FREE, THYROIDAB in the last 72 hours.  Invalid input(s): FREET3 ------------------------------------------------------------------------------------------------------------------ No results for input(s): VITAMINB12, FOLATE, FERRITIN, TIBC, IRON, RETICCTPCT in the last 72 hours.  Coagulation profile No results for input(s): INR, PROTIME in the last 168 hours.  No results for input(s): DDIMER in the last 72 hours.  Cardiac Enzymes No results for input(s): CKMB, TROPONINI, MYOGLOBIN in the last 168 hours.  Invalid input(s): CK ------------------------------------------------------------------------------------------------------------------ No results found for: BNP  Inpatient Medications  Scheduled Meds: . enoxaparin (LOVENOX) injection  40 mg Subcutaneous Q24H  . insulin aspart  0-9 Units Subcutaneous Q6H  . pantoprazole  40 mg Oral Daily  . piperacillin-tazobactam (ZOSYN)  IV  3.375 g Intravenous Q8H  . saccharomyces boulardii  250 mg Oral BID  . sodium chloride flush  10-40 mL Intracatheter Q12H   Continuous Infusions: . Marland KitchenTPN  (CLINIMIX-E) Adult     And  . fat emulsion    . sodium chloride 40 mL/hr at 03/18/16 2356  . sodium chloride    . Marland KitchenTPN (CLINIMIX-E) Adult 60 mL/hr at 03/18/16 1722   PRN Meds:.albuterol, morphine injection, ondansetron **OR** ondansetron (ZOFRAN) IV, sodium chloride flush  Micro Results No results found for this or any previous visit (from the past 240 hour(s)).  Radiology Reports Ct Abdomen Pelvis W Contrast  Result Date: 03/16/2016 CLINICAL DATA:  Abdominal pain. Right lower quadrant pain. Weight loss. Crohn's disease. EXAM: CT ABDOMEN AND PELVIS WITH CONTRAST TECHNIQUE: Multidetector CT imaging of the abdomen and pelvis was performed using the standard protocol following bolus administration of intravenous contrast. CONTRAST:  169m ISOVUE-300 IOPAMIDOL (ISOVUE-300) INJECTION 61% COMPARISON:  [facility] FINDINGS: Lower chest: The lung bases are clear. Hepatobiliary: No focal liver abnormality is seen. No gallstones, gallbladder wall thickening, or biliary dilatation. Pancreas: No ductal dilatation or inflammation. Spleen: Normal in size without focal abnormality. Adrenals/Urinary Tract: Normal adrenal glands. Symmetric renal enhancement without hydronephrosis. Urinary bladder is physiologically distended. Stomach/Bowel: Wall thickening involving the terminal ileum and cecum with moderate adjacent mesenteric inflammation consistent with active Crohn's. There multiple adjacent irregular fluid collections with peripheral enhancement in the right lower quadrant consistent with abscess. These involve both the adjacent fat and right iliopsoas muscle. A discrete collection anteriorly measures 2.8 x 1.7 cm. This may be contiguous with the more posterior collection measuring 2.5 x 4.7 cm, which involves the iliopsoas muscle. The appendix is tentatively identified containing intraluminal fluid with similar stranding to the adjacent inflamed cecum and  ascending colon. No other bowel wall thickening to suggest additional sites of active Crohn's. Moderate stool in the more distal colon. Vascular/Lymphatic: No significant vascular findings are present. No enlarged abdominal or pelvic lymph nodes. Reproductive: Prostate is unremarkable. Other: Right lower quadrant abscesses as described. No pelvic free fluid. No free air. Musculoskeletal: Heterogeneous edema of the right iliopsoas muscle with small intramuscular abscess as described. Mild heterogeneous enlargement of the distal iliopsoas likely myositis. There are no acute or suspicious osseous abnormalities. IMPRESSION: Active Crohn's involving the terminal ileum and cecum. There are adjacent irregular abscesses in the right lower quadrant involving the adjacent fat and iliopsoas muscle. Fluid collections are irregular in shape, and measure approximately 2.8 and 4.7 cm respectively. Electronically Signed   By: MJeb LeveringM.D.   On: 03/16/2016 03:24     [facility] Rudine Rieger M.D on 03/19/2016 at 10:59 AM  Between 7am to 7pm - Pager - [phone] After 7pm go to [email or web address] - password [facility] -  Office  [phone]

## 2016-03-19 NOTE — Progress Notes (Signed)
     [facility] Progress Note  Chief Complaint:  Crohn's disease, abscess  Subjective: passing a lot of flatus. No abdominal pain just an awareness of RLQ. No nausea.   Objective:  Vital signs in last 24 hours: Temp:  [98.3 F (36.8 C)-99.7 F (37.6 C)] 98.3 F (36.8 C) (01/22 0525) Pulse Rate:  [71-95] 71 (01/22 0525) Resp:  [12-16] 12 (01/22 0525) BP: (87-118)/(50-66) 118/66 (01/22 0525) SpO2:  [98 %-100 %] 100 % (01/22 0525) Last BM Date: 03/15/16 General:   Alert, well-developed, black male in NAD EENT:  Normal hearing, non icteric sclera, conjunctive pink.  Heart:  Regular rate and rhythm; no murmurs. no lower extremity edema, +SCDs Pulm: Normal respiratory effort, lungs CTA bilaterally without wheezes or crackles. Abdomen:  Soft, mildly distended, mild RLQ tenderness  Normal bowel sounds, no masses felt. No hepatomegaly.    Neurologic:  Alert and  oriented x4;  grossly normal neurologically. Psych:  Alert and cooperative. Normal mood and affect.   Intake/Output from previous day: 01/21 0701 - 01/22 0700 In: 50 [IV Piggyback:50] Out: 300 [Urine:300] Intake/Output this shift: No intake/output data recorded.  Lab Results:  Recent Labs  03/17/16 0351 03/18/16 0800 03/19/16 0530  WBC 12.1* 12.2* 10.8*  HGB 9.3* 8.8* 9.2*  HCT 29.6* 28.2* 29.4*  PLT 445* 411* 411*   BMET  Recent Labs  03/17/16 0351 03/18/16 0800 03/19/16 0530  NA 136 135 138  K 3.9 3.5 3.6  CL 105 102 106  CO2 24 26 27   GLUCOSE 84 99 107*  BUN 14 12 11   CREATININE 0.87 0.77 0.74  CALCIUM 8.7* 8.3* 8.5*   LFT  Recent Labs  03/19/16 0530  PROT 7.0  ALBUMIN 2.9*  AST 12*  ALT 8*  ALKPHOS 30*  BILITOT 0.4    Recent Labs  03/16/16 1445  HEPBSAG Negative   Assessment / Plan:   36 yo male with small bowel Crohn's in Imuran and tapering dose of steroids at time of admission 5 days ago when he presented with RLQ abscesses on imaging. Abscesses not amenable to  IR drainage. Surgery following.  -Continue bowel rest, TNA initiated yesterday -IV antibiotics-on day #5 of Zosyn -imuran on hold for now -Need reimaging and if abscesses not resolving he may need drainage and bowel resection.   Will order CTscan abd/pelvis to be done tomorrow -Long term plan is for biologics.HBsAg is negative. Quantiferon tb gold is pending.  -continue Lovenox for DVT prevention as he is at increased risk   Principal Problem:   Crohn's colitis, with abscess ([place]) Active Problems:   Crohn's colitis ([place])   Leukocytosis   Anemia    LOS: 3 days   Tye Savoy NP  03/19/2016, 9:48 AM  Pager number [phone]  GI ATTENDING  Interval history data reviewed. Case discussed with Dr. Havery Moros. Patient personally seen and examined. Agree with interval progress note. Patient is stable. Still with some symptoms but feeling much better since admission. Stable on antibiotics. Plan for reimaging tomorrow.  Docia Chuck. Geri Seminole., M.D. [facility] Division of Gastroenterology

## 2016-03-20 ENCOUNTER — Inpatient Hospital Stay (HOSPITAL_COMMUNITY): Payer: BLUE CROSS/BLUE SHIELD

## 2016-03-20 DIAGNOSIS — K50014 Crohn's disease of small intestine with abscess: Secondary | ICD-10-CM

## 2016-03-20 LAB — GLUCOSE, CAPILLARY
Glucose-Capillary: 116 mg/dL — ABNORMAL HIGH (ref 65–99)
Glucose-Capillary: 120 mg/dL — ABNORMAL HIGH (ref 65–99)
Glucose-Capillary: 117 mg/dL — ABNORMAL HIGH (ref 65–99)

## 2016-03-20 LAB — CBC
HCT: 29.1 % — ABNORMAL LOW (ref 39.0–52.0)
Hemoglobin: 9.2 g/dL — ABNORMAL LOW (ref 13.0–17.0)
MCH: 25.7 pg — AB (ref 26.0–34.0)
MCHC: 31.6 g/dL (ref 30.0–36.0)
MCV: 81.3 fL (ref 78.0–100.0)
PLATELETS: 368 10*3/uL (ref 150–400)
RBC: 3.58 MIL/uL — ABNORMAL LOW (ref 4.22–5.81)
RDW: 14.1 % (ref 11.5–15.5)
WBC: 12.7 10*3/uL — ABNORMAL HIGH (ref 4.0–10.5)

## 2016-03-20 LAB — BASIC METABOLIC PANEL
Anion gap: 7 (ref 5–15)
BUN: 12 mg/dL (ref 6–20)
CALCIUM: 8.4 mg/dL — AB (ref 8.9–10.3)
CO2: 24 mmol/L (ref 22–32)
CREATININE: 0.71 mg/dL (ref 0.61–1.24)
Chloride: 107 mmol/L (ref 101–111)
Glucose, Bld: 124 mg/dL — ABNORMAL HIGH (ref 65–99)
Potassium: 3.7 mmol/L (ref 3.5–5.1)
SODIUM: 138 mmol/L (ref 135–145)

## 2016-03-20 LAB — QUANTIFERON IN TUBE
QFT TB AG MINUS NIL VALUE: 0 [IU]/mL
QUANTIFERON MITOGEN VALUE: 0.09 IU/mL
QUANTIFERON NIL VALUE: 0.02 [IU]/mL
QUANTIFERON TB AG VALUE: 0.02 IU/mL
QUANTIFERON TB GOLD: UNDETERMINED

## 2016-03-20 LAB — PHOSPHORUS: PHOSPHORUS: 3.1 mg/dL (ref 2.5–4.6)

## 2016-03-20 LAB — QUANTIFERON TB GOLD ASSAY (BLOOD)

## 2016-03-20 LAB — MAGNESIUM: MAGNESIUM: 2 mg/dL (ref 1.7–2.4)

## 2016-03-20 MED ORDER — MORPHINE SULFATE (PF) 2 MG/ML IV SOLN
2.0000 mg | INTRAVENOUS | Status: DC | PRN
  Administered 2016-03-20 (×2): 4 mg via INTRAVENOUS
  Administered 2016-03-21 (×2): 2 mg via INTRAVENOUS
  Administered 2016-03-21 – 2016-03-26 (×12): 4 mg via INTRAVENOUS
  Administered 2016-03-26 – 2016-03-27 (×2): 2 mg via INTRAVENOUS
  Filled 2016-03-20 (×3): qty 2
  Filled 2016-03-20: qty 1
  Filled 2016-03-20 (×8): qty 2
  Filled 2016-03-20 (×3): qty 1
  Filled 2016-03-20 (×3): qty 2
  Filled 2016-03-20: qty 1

## 2016-03-20 MED ORDER — ACETAMINOPHEN 325 MG PO TABS
650.0000 mg | ORAL_TABLET | Freq: Four times a day (QID) | ORAL | Status: DC | PRN
  Administered 2016-03-20: 650 mg via ORAL
  Filled 2016-03-20: qty 2

## 2016-03-20 MED ORDER — IOPAMIDOL (ISOVUE-300) INJECTION 61%
100.0000 mL | Freq: Once | INTRAVENOUS | Status: AC | PRN
Start: 2016-03-20 — End: 2016-03-20
  Administered 2016-03-20: 80 mL via INTRAVENOUS

## 2016-03-20 MED ORDER — IOPAMIDOL (ISOVUE-300) INJECTION 61%
INTRAVENOUS | Status: AC
  Filled 2016-03-20: qty 30

## 2016-03-20 MED ORDER — M.V.I. ADULT IV INJ
INTRAVENOUS | Status: AC
  Administered 2016-03-20: 18:00:00 via INTRAVENOUS
  Filled 2016-03-20: qty 1920

## 2016-03-20 MED ORDER — INSULIN ASPART 100 UNIT/ML ~~LOC~~ SOLN
0.0000 [IU] | Freq: Three times a day (TID) | SUBCUTANEOUS | Status: DC
  Administered 2016-03-21 – 2016-03-22 (×2): 1 [IU] via SUBCUTANEOUS

## 2016-03-20 MED ORDER — IOPAMIDOL (ISOVUE-300) INJECTION 61%
100.0000 mL | Freq: Once | INTRAVENOUS | Status: AC | PRN
  Administered 2016-03-26: 100 mL via INTRAVENOUS

## 2016-03-20 MED ORDER — FAT EMULSION 20 % IV EMUL
240.0000 mL | INTRAVENOUS | Status: AC
  Administered 2016-03-20: 240 mL via INTRAVENOUS
  Filled 2016-03-20: qty 250

## 2016-03-20 MED ORDER — IOPAMIDOL (ISOVUE-300) INJECTION 61%: 15.0000 mL | Freq: Once | INTRAVENOUS | Status: DC | PRN

## 2016-03-20 MED ORDER — IOPAMIDOL (ISOVUE-300) INJECTION 61%
INTRAVENOUS | Status: AC
  Filled 2016-03-20: qty 100

## 2016-03-20 NOTE — Progress Notes (Signed)
  Subjective: Still having some pain in the RLQ.  Otherwise no real change.  Objective: Vital signs in last 24 hours: Temp:  [98.2 F (36.8 C)-100.1 F (37.8 C)] 99 F (37.2 C) (01/23 0524) Pulse Rate:  [86-96] 95 (01/23 0524) Resp:  [16-18] 18 (01/23 0524) BP: (100-118)/(58-70) 118/70 (01/23 0524) SpO2:  [98 %-99 %] 98 % (01/23 0524) Last BM Date: 03/15/16 2400 IV 850 urine recorded TM 100.1, VSS WBC up to 12.7 this AM/BMP stable  Intake/Output from previous day: 01/22 0701 - 01/23 0700 In: 2436.7 [I.V.:2436.7] Out: 850 [Urine:850] Intake/Output this shift: No intake/output data recorded.  General appearance: alert, cooperative and no distress GI: soft, sore RLQ.    Lab Results:   Recent Labs  03/19/16 0530 03/20/16 0534  WBC 10.8* 12.7*  HGB 9.2* 9.2*  HCT 29.4* 29.1*  PLT 411* 368    BMET  Recent Labs  03/19/16 0530 03/20/16 0534  NA 138 138  K 3.6 3.7  CL 106 107  CO2 27 24  GLUCOSE 107* 124*  BUN 11 12  CREATININE 0.74 0.71  CALCIUM 8.5* 8.4*   PT/INR No results for input(s): LABPROT, INR in the last 72 hours.   Recent Labs Lab 03/16/16 0101 03/18/16 0800 03/19/16 0530  AST 13* 11* 12*  ALT 12* 9* 8*  ALKPHOS 39 31* 30*  BILITOT 0.4 0.6 0.4  PROT 7.8 6.6 7.0  ALBUMIN 3.3* 2.7* 2.9*     Lipase     Component Value Date/Time   LIPASE 19 03/16/2016 0101     Studies/Results: No results found.  Medications: . enoxaparin (LOVENOX) injection  40 mg Subcutaneous Q24H  . insulin aspart  0-9 Units Subcutaneous Q6H  . iopamidol      . pantoprazole  40 mg Oral Daily  . piperacillin-tazobactam (ZOSYN)  IV  3.375 g Intravenous Q8H  . saccharomyces boulardii  250 mg Oral BID  . sodium chloride flush  10-40 mL Intracatheter Q12H   . Marland KitchenTPN (CLINIMIX-E) Adult 80 mL/hr at 03/19/16 1743  . sodium chloride 20 mL/hr at 03/19/16 1827    Assessment/Plan Crohn's colitis with fluid collections - terminal illeitis - not amenable to IR  drain On Imuran/Prednisone at home FEN:  TNA/ice chips ID: [email or web address] 03/15/16 =>> day 6 DVT:  Lovenox    Plan:  CT scan pending today, he is drinking contrast now.   LOS: 4 days    Larry [name] 03/20/2016 [phone]

## 2016-03-20 NOTE — Progress Notes (Signed)
PROGRESS NOTE                                                                                                                                                                                                             Patient Demographics:    Larry [name], is a 36 y.o. male, DOB - [birth year], WNU:[id number]  Admit date - 03/16/2016   Admitting Physician Norval Morton, MD  Outpatient Primary MD for the patient is Pcp Not In System  LOS - 4   Chief Complaint  Patient presents with  . Abdominal Pain       Brief Narrative   36 y.o. male with medical history significant of Crohn's colitis on azathioprine originally diagnosed in 2015; who presents with waxing and waning sharp right lower quadrant abdominal pain., Recent Crohn's flare in August 2017, still on prednisone taper, admitted for Crohn's exacerbation with abscess.   Subjective:    Larry [name] today has, No headache, No chest pain, Reports abdominal pain - No Nausea, vomiting or diarrhea.   Assessment  & Plan :    Principal Problem:   Crohn's colitis, with abscess ([place]) Active Problems:   Crohn's colitis ([place])   Leukocytosis   Anemia   Abnormal CT of the abdomen   Crohn's Terminal ileitis, with abscess - Patient with known history of Crohn's disease, CT abdomen pelvis significant for Crohn's colitis with abscess. - Continue with IV Zosyn. - GI input greatly appreciated, continue nothing by mouth, bowel rest, and IV Zosyn, anticipated prolonged course of bowel rest,PICC line inserted 1/20, and started on TPN. - surgery input regarding abscesses  appreciated, not amendable to drainage by IR, cont with IV Zosyn, repeat CT abdomen pelvis 1/23 showing severe ileitis, as well enlargement of transverse abdomen and psoas muscle abscess, very likely patient will need surgical intervention, awaiting surgery input for today.  Anemia - Initial hemoglobin on to be 10.6. Trending down  secondary to dilution,  Most likely anemia of chronic disease - Continue to monitor    Thrombocytosis - likely reactive      Code Status : Full  Family Communication  : None at bedside  Disposition Plan  : home when stable  Consults  :  GI, Gen surgery  Procedures  : none  DVT Prophylaxis  :  Lovenox -SCDs   Lab Results  Component Value Date   PLT 368 03/20/2016    Antibiotics  :    Anti-infectives    Start     Dose/Rate Route Frequency Ordered Stop   03/16/16 1400  piperacillin-tazobactam (ZOSYN) IVPB 3.375 g     3.375 g 12.5 mL/hr over 240 Minutes Intravenous Every 8 hours 03/16/16 0548     03/16/16 0415  piperacillin-tazobactam (ZOSYN) IVPB 3.375 g     3.375 g 12.5 mL/hr over 240 Minutes Intravenous  Once 03/16/16 0411 03/16/16 0844        Objective:   Vitals:   03/19/16 0525 03/19/16 1328 03/19/16 2049 03/20/16 0524  BP: 118/66 100/66 (!) 108/58 118/70  Pulse: 71 86 96 95  Resp: 12 16 18 18   Temp: 98.3 F (36.8 C) 98.2 F (36.8 C) 100.1 F (37.8 C) 99 F (37.2 C)  TempSrc: Oral Oral Oral Oral  SpO2: 100% 99% 98% 98%  Weight:      Height:        Wt Readings from Last 3 Encounters:  03/16/16 78.5 kg (173 lb)     Intake/Output Summary (Last 24 hours) at 03/20/16 1253 Last data filed at 03/20/16 0615  Gross per 24 hour  Intake          2436.67 ml  Output              850 ml  Net          1586.67 ml     Physical Exam  Awake Alert, Oriented X 3,  Supple Neck,No JVD,  Symmetrical Chest wall movement, Good air movement bilaterally, CTAB RRR,No Gallops,Rubs or new Murmurs, No Parasternal Heave +ve B.Sounds, Abd Soft, mild tenderness, , No rebound - guarding or rigidity. No Cyanosis, Clubbing or edema, No new Rash or bruise      Data Review:    CBC  Recent Labs Lab 03/16/16 0101 03/17/16 0351 03/18/16 0800 03/19/16 0530 03/20/16 0534  WBC 12.3* 12.1* 12.2* 10.8* 12.7*  HGB 10.6* 9.3* 8.8* 9.2* 9.2*  HCT 33.0* 29.6* 28.2* 29.4*  29.1*  PLT 483* 445* 411* 411* 368  MCV 81.5 82.0 80.6 80.1 81.3  MCH 26.2 25.8* 25.1* 25.1* 25.7*  MCHC 32.1 31.4 31.2 31.3 31.6  RDW 14.1 14.0 14.3 14.0 14.1  LYMPHSABS 1.6  --  1.2  --   --   MONOABS 1.2*  --  1.0  --   --   EOSABS 0.1  --  0.0  --   --   BASOSABS 0.0  --  0.0  --   --     Chemistries   Recent Labs Lab 03/16/16 0101 03/17/16 0350 03/17/16 0351 03/18/16 0800 03/19/16 0530 03/20/16 0534  NA 135  --  136 135 138 138  K 3.7  --  3.9 3.5 3.6 3.7  CL 102  --  105 102 106 107  CO2 25  --  24 26 27 24   GLUCOSE 96  --  84 99 107* 124*  BUN 12  --  14 12 11 12   CREATININE 0.78  --  0.87 0.77 0.74 0.71  CALCIUM 8.8*  --  8.7* 8.3* 8.5* 8.4*  MG  --  1.9  --  1.8 2.0 2.0  AST 13*  --   --  11* 12*  --   ALT 12*  --   --  9* 8*  --   ALKPHOS 39  --   --  31* 30*  --   BILITOT 0.4  --   --  0.6 0.4  --    ------------------------------------------------------------------------------------------------------------------  Recent Labs  03/18/16 0800  TRIG 98    No results found for: HGBA1C ------------------------------------------------------------------------------------------------------------------ No results for input(s): TSH, T4TOTAL, T3FREE, THYROIDAB in the last 72 hours.  Invalid input(s): FREET3 ------------------------------------------------------------------------------------------------------------------ No results for input(s): VITAMINB12, FOLATE, FERRITIN, TIBC, IRON, RETICCTPCT in the last 72 hours.  Coagulation profile No results for input(s): INR, PROTIME in the last 168 hours.  No results for input(s): DDIMER in the last 72 hours.  Cardiac Enzymes No results for input(s): CKMB, TROPONINI, MYOGLOBIN in the last 168 hours.  Invalid input(s): CK ------------------------------------------------------------------------------------------------------------------ No results found for: BNP  Inpatient Medications  Scheduled Meds: .  enoxaparin (LOVENOX) injection  40 mg Subcutaneous Q24H  . insulin aspart  0-9 Units Subcutaneous Q8H  . iopamidol      . iopamidol      . pantoprazole  40 mg Oral Daily  . piperacillin-tazobactam (ZOSYN)  IV  3.375 g Intravenous Q8H  . saccharomyces boulardii  250 mg Oral BID  . sodium chloride flush  10-40 mL Intracatheter Q12H   Continuous Infusions: . Marland KitchenTPN (CLINIMIX-E) Adult 80 mL/hr at 03/19/16 1743  . Marland KitchenTPN (CLINIMIX-E) Adult     And  . fat emulsion    . sodium chloride 20 mL/hr at 03/20/16 1039   PRN Meds:.albuterol, iopamidol, iopamidol, morphine injection, ondansetron **OR** ondansetron (ZOFRAN) IV, sodium chloride flush  Micro Results No results found for this or any previous visit (from the past 240 hour(s)).  Radiology Reports Ct Abdomen Pelvis W Contrast  Result Date: 03/20/2016 CLINICAL DATA:  Right lower quadrant abdominal pain. History of Crohn' s disease. EXAM: CT ABDOMEN AND PELVIS WITH CONTRAST TECHNIQUE: Multidetector CT imaging of the abdomen and pelvis was performed using the standard protocol following bolus administration of intravenous contrast. CONTRAST:  38m ISOVUE-300 IOPAMIDOL (ISOVUE-300) INJECTION 61% COMPARISON:  03/16/2016 CT scan FINDINGS: Lower chest: Linear subsegmental atelectasis in the posterior basal segment right lower lobe. Density in the cavoatrial junction is probably a central line, correlate with patient history. Hepatobiliary: Unremarkable Pancreas: Unremarkable Spleen: Unremarkable Adrenals/Urinary Tract: Unremarkable Stomach/Bowel: Similar to prior there is a 14 cm region of severe wall thickening in the terminal ileum with abscess tracking along the right transverse abdominis muscle in the right retroperitoneum to involve the right psoas muscle, volume of abscess approximately 80 cc and containing gas and fluid with thick enhancing margins, this is increased in size compared to the prior exam. On images 56-57 of series 2 there small locules of  extraluminal gas medial to the terminal ileum compatible with contained perforation or small microabscesses. Air-fluid level in the contrast in the rectum. Vascular/Lymphatic: Small reactive right inguinal and pelvic lymph nodes. Reproductive: Unremarkable Other: No supplemental non-categorized findings. Musculoskeletal: Sacroiliac joints unremarkable. Partially calcified annulus fibrosis along a disc bulge at L5-S1. IMPRESSION: 1. Severe wall thickening in the distal 14 cm of the terminal ileum, with small amounts of adjacent extraluminal loculated gas containing compatible with micro perforation, and with enlargement of the right transverse abdominis and psoas muscle abscess which is approximately 80 cc in volume. Electronically Signed   By: WVan ClinesM.D.   On: 03/20/2016 12:12   Ct Abdomen Pelvis W Contrast  Result Date: 03/16/2016 CLINICAL DATA:  Abdominal pain. Right lower quadrant pain. Weight loss. Crohn's disease. EXAM: CT ABDOMEN AND PELVIS WITH CONTRAST TECHNIQUE: Multidetector CT imaging of the abdomen and pelvis was performed using the standard protocol following bolus administration of intravenous contrast. CONTRAST:  1068m  ISOVUE-300 IOPAMIDOL (ISOVUE-300) INJECTION 61% COMPARISON:  CT abdomen/ pelvis 10/28/2015 at [facility]. FINDINGS: Lower chest: The lung bases are clear. Hepatobiliary: No focal liver abnormality is seen. No gallstones, gallbladder wall thickening, or biliary dilatation. Pancreas: No ductal dilatation or inflammation. Spleen: Normal in size without focal abnormality. Adrenals/Urinary Tract: Normal adrenal glands. Symmetric renal enhancement without hydronephrosis. Urinary bladder is physiologically distended. Stomach/Bowel: Wall thickening involving the terminal ileum and cecum with moderate adjacent mesenteric inflammation consistent with active Crohn's. There multiple adjacent irregular fluid collections with peripheral enhancement in the right lower  quadrant consistent with abscess. These involve both the adjacent fat and right iliopsoas muscle. A discrete collection anteriorly measures 2.8 x 1.7 cm. This may be contiguous with the more posterior collection measuring 2.5 x 4.7 cm, which involves the iliopsoas muscle. The appendix is tentatively identified containing intraluminal fluid with similar stranding to the adjacent inflamed cecum and ascending colon. No other bowel wall thickening to suggest additional sites of active Crohn's. Moderate stool in the more distal colon. Vascular/Lymphatic: No significant vascular findings are present. No enlarged abdominal or pelvic lymph nodes. Reproductive: Prostate is unremarkable. Other: Right lower quadrant abscesses as described. No pelvic free fluid. No free air. Musculoskeletal: Heterogeneous edema of the right iliopsoas muscle with small intramuscular abscess as described. Mild heterogeneous enlargement of the distal iliopsoas likely myositis. There are no acute or suspicious osseous abnormalities. IMPRESSION: Active Crohn's involving the terminal ileum and cecum. There are adjacent irregular abscesses in the right lower quadrant involving the adjacent fat and iliopsoas muscle. Fluid collections are irregular in shape, and measure approximately 2.8 and 4.7 cm respectively. Electronically Signed   By: Jeb Levering M.D.   On: 03/16/2016 03:24     Rhyse Loux M.D on 03/20/2016 at 12:53 PM  Between 7am to 7pm - Pager - [phone]  After 7pm go to [email or web address] - password [facility]  [facility] -  Office  [phone]

## 2016-03-20 NOTE — Progress Notes (Signed)
     [facility] Progress Note  Chief Complaint:   Crohn's disease  Subjective: Just back from CT scan. He feels about the same as yesterday. Still has RLQ "awareness", no significant pain. No nausea. Small BM yesterday  Objective:  Vital signs in last 24 hours: Temp:  [98.2 F (36.8 C)-100.1 F (37.8 C)] 99 F (37.2 C) (01/23 0524) Pulse Rate:  [86-96] 95 (01/23 0524) Resp:  [16-18] 18 (01/23 0524) BP: (100-118)/(58-70) 118/70 (01/23 0524) SpO2:  [98 %-99 %] 98 % (01/23 0524) Last BM Date: 03/15/16 General:   Alert, well-developed,  Black male in NAD EENT:  Normal hearing, non icteric sclera, conjunctive pink.  Heart:  Regular rate and rhythm, no lower extremity edema Pulm: Normal respiratory effort, lungs CTA bilaterally without wheezes or crackles. Abdomen:  Soft, nondistended, nontender.  Normal bowel sounds, no masses felt. No hepatomegaly.    Neurologic:  Alert and  oriented x4;  grossly normal neurologically. Psych:  Alert and cooperative. Normal mood and affect.   Intake/Output from previous day: 01/22 0701 - 01/23 0700 In: 2436.7 [I.V.:2436.7] Out: 850 [Urine:850] Intake/Output this shift: No intake/output data recorded.  Lab Results:  Recent Labs  03/18/16 0800 03/19/16 0530 03/20/16 0534  WBC 12.2* 10.8* 12.7*  HGB 8.8* 9.2* 9.2*  HCT 28.2* 29.4* 29.1*  PLT 411* 411* 368   BMET  Recent Labs  03/18/16 0800 03/19/16 0530 03/20/16 0534  NA 135 138 138  K 3.5 3.6 3.7  CL 102 106 107  CO2 26 27 24   GLUCOSE 99 107* 124*  BUN 12 11 12   CREATININE 0.77 0.74 0.71  CALCIUM 8.3* 8.5* 8.4*   LFT  Recent Labs  03/19/16 0530  PROT 7.0  ALBUMIN 2.9*  AST 12*  ALT 8*  ALKPHOS 30*  BILITOT 0.4   Assessment / Plan:  1. Small bowel Crohn's, admitted with RLQ abscesses (not amenable to IR drainage). Surgery following. If abscesses not resolving the may need surgical intervention  -Repeat CTscan just done, results pending.  -continue  bowel rest -continue IV antibiotics, on day 6 of Zosyn -home imuran on hold -Long term plan is to treat with biologics. Tb study pending. His HBV titer is low so will need HBV vaccination prior to tx but can be done outpatient.   2. Normocytic anemia, stable.   Principal Problem:   Crohn's colitis, with abscess ([place]) Active Problems:   Crohn's colitis ([place])   Leukocytosis   Anemia   Abnormal CT of the abdomen   LOS: 4 days   Tye Savoy NP 03/20/2016, 9:07 AM Pager number [phone]  GI ATTENDING  Interval history data reviewed. CT scan completed. To be reviewed. Clinical stable. Continue current medical therapies. Suspect he will come to ileal resection some point.  Docia Chuck. Geri Seminole., M.D. [facility] Division of Gastroenterology

## 2016-03-20 NOTE — Progress Notes (Signed)
PHARMACY - ADULT TOTAL PARENTERAL NUTRITION CONSULT NOTE   Pharmacy Consult for TPN Indication: Crohn's disease on bowel rest with multiple abscesses   Patient Measurements: Height: 6' (182.9 cm) Weight: 173 lb (78.5 kg) IBW/kg (Calculated) : 77.6 TPN AdjBW (KG): 78.5 Body mass index is 23.46 kg/m. Usual Weight: 95kg (7 mo ago)  Insulin Requirements: 1 unit SSI required   Current Nutrition: NPO  IVF: NS at 20 ml/hr  Central access: PICC placed 1/20 TPN start date: 1/20  ASSESSMENT                                                                                                     HPI:  [name] with history of Crohn's disease presents 1/19 to [facility] ED with c/o abd pain.  CT reveals active Crohn's and RLQ abscesses and fluid collection. These are not amendable to IR drainage.  GI has stated it was reasonable to start TPN since will be NPO and will have follow- scans week of 1/22 to determine if will need surgical intervention  Significant events:  1/22: Plan CTscan abd/pelvis in AM  Today:    Glucose - no PMH of DM. CBGs controlled with minimal SSI use  Electrolytes - WNL, corr Ca WNL  Renal -WNL  LFTs - low  TGs - WNL 98 (1/21)  Prealbumin - 13.6 (1/21)  NUTRITIONAL GOALS                                                                                             RD recs: 2125 - 2480 kcals, 95-110 gm protein Clinimix 5/20 at a goal rate of 68m/hr + 20% fat emulsion at 235mhr over 12h to provide: 96g/day protein, 2169Kcal/day.  All Clinimix products are currently on backorder.  Pharmacy will use Clinimix product based on what we have available in stock that will most closely match patient's needs.  Formulation changes will be communicated in progress notes.  PLAN                                                                                                                         At 1800 today:  Continue Clinimix E 5/20 to 8016mr (goal)  20% fat emulsion at 52m/hr  over 12h   TPN to contain standard multivitamins daily and trace elements every other day.  This is due to national backorder of trace elements   Continue IVF to 231mhr.  Decrease sensitive SSI to q8h as he is requiring minimal coverage with TPN at goal.   TPN lab panels on Mondays & Thursdays, check BMET in AM.  F/u daily.  ErPeggyann JubaPharmD, BCPS Pager: [phone]/23/2018 6:55 AM

## 2016-03-20 NOTE — Consult Note (Signed)
Chief Complaint: Patient was seen in consultation today for image guided RLQ abdominal drain/aspiration for abdominal abscess.  Chief Complaint  Patient presents with  . Abdominal Pain    Referring Physician(s): Rockne Coons.  Supervising Physician: Marybelle Killings  Patient Status: [facility] - In-pt  History of Present Illness: Larry [name] is a 36 y.o. male with a history of Chron's disease that presented with abdominal pain and  RLQ abdominal abscess on CT (03/16/16). At that time abscesses were small and not amendable to IR for drain. Repeat CT scan today shows abscesses have enlarged. Now referred to IR service for image guided RLQ abdominal drain/aspiration . He currently is in no pain but has pressure in his RLQ. Endorsed that he has had an abdominal drain prior to 2015.  Denies fever, chills, chest pain, shortness of breath, headaches, nausea, or vomiting.   Past Medical History:  Diagnosis Date  . Crohn's disease [facility])     Past Surgical History:  Procedure Laterality Date  . bowel abscess from crohns       Allergies: Patient has no known allergies.  Medications: Prior to Admission medications   Medication Sig Start Date End Date Taking? Authorizing Provider  acetaminophen (TYLENOL) 500 MG tablet Take 1,000 mg by mouth every 6 (six) hours as needed for moderate pain.   Yes Historical Provider, MD  azaTHIOprine (IMURAN) 50 MG tablet Take 2 tablets by mouth daily. 02/18/16  Yes Historical Provider, MD  predniSONE (DELTASONE) 10 MG tablet Take 5 mg by mouth daily.  03/02/16  Yes Historical Provider, MD     History reviewed. No pertinent family history.  Social History   Social History  . Marital status: Single    Spouse name: N/A  . Number of children: N/A  . Years of education: N/A   Social History Main Topics  . Smoking status: Never Smoker  . Smokeless tobacco: Never Used  . Alcohol use No  . Drug use: No  . Sexual activity: Not Asked   Other Topics Concern  .  None   Social History Narrative  . None      Review of Systems See HPI above.  Vital Signs: BP (!) 101/58 (BP Location: Left Arm)   Pulse 95   Temp 100.3 F (37.9 C) (Oral)   Resp 18   Ht 6' (1.829 m)   Wt 173 lb (78.5 kg)   SpO2 98%   BMI 23.46 kg/m   Physical Exam Awake and alert and in no acute distress. Heart is RRR. Lungs clear to auscultation bilaterally. Abdomen is soft, nondistended, and mild tenderness in the RLQ. Bowel sounds are active. No edema noted.   Mallampati Score:     Imaging: Ct Abdomen Pelvis W Contrast  Result Date: 03/20/2016 CLINICAL DATA:  Right lower quadrant abdominal pain. History of Crohn' s disease. EXAM: CT ABDOMEN AND PELVIS WITH CONTRAST TECHNIQUE: Multidetector CT imaging of the abdomen and pelvis was performed using the standard protocol following bolus administration of intravenous contrast. CONTRAST:  87m ISOVUE-300 IOPAMIDOL (ISOVUE-300) INJECTION 61% COMPARISON:  03/16/2016 CT scan FINDINGS: Lower chest: Linear subsegmental atelectasis in the posterior basal segment right lower lobe. Density in the cavoatrial junction is probably a central line, correlate with patient history. Hepatobiliary: Unremarkable Pancreas: Unremarkable Spleen: Unremarkable Adrenals/Urinary Tract: Unremarkable Stomach/Bowel: Similar to prior there is a 14 cm region of severe wall thickening in the terminal ileum with abscess tracking along the right transverse abdominis muscle in the right retroperitoneum to involve the  right psoas muscle, volume of abscess approximately 80 cc and containing gas and fluid with thick enhancing margins, this is increased in size compared to the prior exam. On images 56-57 of series 2 there small locules of extraluminal gas medial to the terminal ileum compatible with contained perforation or small microabscesses. Air-fluid level in the contrast in the rectum. Vascular/Lymphatic: Small reactive right inguinal and pelvic lymph nodes.  Reproductive: Unremarkable Other: No supplemental non-categorized findings. Musculoskeletal: Sacroiliac joints unremarkable. Partially calcified annulus fibrosis along a disc bulge at L5-S1. IMPRESSION: 1. Severe wall thickening in the distal 14 cm of the terminal ileum, with small amounts of adjacent extraluminal loculated gas containing compatible with micro perforation, and with enlargement of the right transverse abdominis and psoas muscle abscess which is approximately 80 cc in volume. Electronically Signed   By: Van Clines M.D.   On: 03/20/2016 12:12   Ct Abdomen Pelvis W Contrast  Result Date: 03/16/2016 CLINICAL DATA:  Abdominal pain. Right lower quadrant pain. Weight loss. Crohn's disease. EXAM: CT ABDOMEN AND PELVIS WITH CONTRAST TECHNIQUE: Multidetector CT imaging of the abdomen and pelvis was performed using the standard protocol following bolus administration of intravenous contrast. CONTRAST:  149m ISOVUE-300 IOPAMIDOL (ISOVUE-300) INJECTION 61% COMPARISON:  [facility] FINDINGS: Lower chest: The lung bases are clear. Hepatobiliary: No focal liver abnormality is seen. No gallstones, gallbladder wall thickening, or biliary dilatation. Pancreas: No ductal dilatation or inflammation. Spleen: Normal in size without focal abnormality. Adrenals/Urinary Tract: Normal adrenal glands. Symmetric renal enhancement without hydronephrosis. Urinary bladder is physiologically distended. Stomach/Bowel: Wall thickening involving the terminal ileum and cecum with moderate adjacent mesenteric inflammation consistent with active Crohn's. There multiple adjacent irregular fluid collections with peripheral enhancement in the right lower quadrant consistent with abscess. These involve both the adjacent fat and right iliopsoas muscle. A discrete collection anteriorly measures 2.8 x 1.7 cm. This may be contiguous with the more posterior collection measuring 2.5 x  4.7 cm, which involves the iliopsoas muscle. The appendix is tentatively identified containing intraluminal fluid with similar stranding to the adjacent inflamed cecum and ascending colon. No other bowel wall thickening to suggest additional sites of active Crohn's. Moderate stool in the more distal colon. Vascular/Lymphatic: No significant vascular findings are present. No enlarged abdominal or pelvic lymph nodes. Reproductive: Prostate is unremarkable. Other: Right lower quadrant abscesses as described. No pelvic free fluid. No free air. Musculoskeletal: Heterogeneous edema of the right iliopsoas muscle with small intramuscular abscess as described. Mild heterogeneous enlargement of the distal iliopsoas likely myositis. There are no acute or suspicious osseous abnormalities. IMPRESSION: Active Crohn's involving the terminal ileum and cecum. There are adjacent irregular abscesses in the right lower quadrant involving the adjacent fat and iliopsoas muscle. Fluid collections are irregular in shape, and measure approximately 2.8 and 4.7 cm respectively. Electronically Signed   By: MJeb LeveringM.D.   On: 03/16/2016 03:24    Labs:  CBC:  Recent Labs  03/17/16 0351 03/18/16 0800 03/19/16 0530 03/20/16 0534  WBC 12.1* 12.2* 10.8* 12.7*  HGB 9.3* 8.8* 9.2* 9.2*  HCT 29.6* 28.2* 29.4* 29.1*  PLT 445* 411* 411* 368    COAGS: No results for input(s): INR, APTT in the last 8760 hours.  BMP:  Recent Labs  03/17/16 0351 03/18/16 0800 03/19/16 0530 03/20/16 0534  NA 136 135 138 138  K 3.9 3.5 3.6 3.7  CL 105 102 106 107  CO2 24 26 27 24   GLUCOSE 84 99  107* 124*  BUN 14 12 11 12   CALCIUM 8.7* 8.3* 8.5* 8.4*  CREATININE 0.87 0.77 0.74 0.71  GFRNONAA >60 >60 >60 >60  GFRAA >60 >60 >60 >60    LIVER FUNCTION TESTS:  Recent Labs  03/16/16 0101 03/18/16 0800 03/19/16 0530  BILITOT 0.4 0.6 0.4  AST 13* 11* 12*  ALT 12* 9* 8*  ALKPHOS 39 31* 30*  PROT 7.8 6.6 7.0  ALBUMIN 3.3*  2.7* 2.9*    TUMOR MARKERS: No results for input(s): AFPTM, CEA, CA199, CHROMGRNA in the last 8760 hours.  Assessment and Plan:  Hx Chron's colitis; RLQ abdominal abscesses appear to have enlarged on CT scan performed today. Referred for image guided RLQ abdominal drain/aspiration. Patient appears stable. WBC 12.7 (10.8) and hemoglobin is stable.  Will order PT and INR. Dr. Kathlene Cote has reviewed the images and agrees with procedure. Drain placement is schedule for tomorrow. Risks and benefits discussed with the patient including bleeding, infection, damage to adjacent structures, bowel perforation/fistula connection, and sepsis. All of the patient's questions were answered, patient is agreeable to proceed. Consent signed and in chart.    Thank you for this interesting consult.  I greatly enjoyed meeting Marvel Mcphillips and look forward to participating in their care.  A copy of this report was sent to the requesting provider on this date.  Electronically Signed: D. Rowe Robert 03/20/2016, 3:51 PM   I spent a total of 40 Minutes    in face to face in clinical consultation, greater than 50% of which was counseling/coordinating care for image guided RLQ abdominal drain/aspiration

## 2016-03-21 ENCOUNTER — Inpatient Hospital Stay (HOSPITAL_COMMUNITY): Payer: BLUE CROSS/BLUE SHIELD

## 2016-03-21 LAB — GLUCOSE, CAPILLARY
GLUCOSE-CAPILLARY: 129 mg/dL — AB (ref 65–99)
GLUCOSE-CAPILLARY: 120 mg/dL — AB (ref 65–99)
Glucose-Capillary: 118 mg/dL — ABNORMAL HIGH (ref 65–99)

## 2016-03-21 LAB — CBC
HEMATOCRIT: 29 % — AB (ref 39.0–52.0)
HEMOGLOBIN: 9.2 g/dL — AB (ref 13.0–17.0)
MCH: 25.1 pg — ABNORMAL LOW (ref 26.0–34.0)
MCHC: 31.7 g/dL (ref 30.0–36.0)
MCV: 79.2 fL (ref 78.0–100.0)
Platelets: 382 10*3/uL (ref 150–400)
RBC: 3.66 MIL/uL — ABNORMAL LOW (ref 4.22–5.81)
RDW: 13.7 % (ref 11.5–15.5)
WBC: 12 10*3/uL — AB (ref 4.0–10.5)

## 2016-03-21 LAB — BASIC METABOLIC PANEL
ANION GAP: 7 (ref 5–15)
BUN: 12 mg/dL (ref 6–20)
CHLORIDE: 104 mmol/L (ref 101–111)
CO2: 23 mmol/L (ref 22–32)
Calcium: 8.2 mg/dL — ABNORMAL LOW (ref 8.9–10.3)
Creatinine, Ser: 0.77 mg/dL (ref 0.61–1.24)
GFR calc Af Amer: >60 mL/min (ref >60)
GLUCOSE: 125 mg/dL — AB (ref 65–99)
POTASSIUM: 3.7 mmol/L (ref 3.5–5.1)
Sodium: 134 mmol/L — ABNORMAL LOW (ref 135–145)

## 2016-03-21 LAB — PROTIME-INR
INR: 1.33
Prothrombin Time: 16.5 seconds — ABNORMAL HIGH (ref 11.4–15.2)

## 2016-03-21 MED ORDER — TRACE MINERALS CR-CU-MN-SE-ZN 10-1000-500-60 MCG/ML IV SOLN
INTRAVENOUS | Status: AC
  Administered 2016-03-21: 17:00:00 via INTRAVENOUS
  Filled 2016-03-21: qty 1920

## 2016-03-21 MED ORDER — MIDAZOLAM HCL 2 MG/2ML IJ SOLN
INTRAMUSCULAR | Status: AC | PRN
  Administered 2016-03-21 (×5): 1 mg via INTRAVENOUS

## 2016-03-21 MED ORDER — FAT EMULSION 20 % IV EMUL
240.0000 mL | INTRAVENOUS | Status: AC
  Administered 2016-03-21: 240 mL via INTRAVENOUS
  Filled 2016-03-21: qty 250

## 2016-03-21 MED ORDER — FENTANYL CITRATE (PF) 100 MCG/2ML IJ SOLN
INTRAMUSCULAR | Status: AC
  Filled 2016-03-21: qty 4

## 2016-03-21 MED ORDER — FENTANYL CITRATE (PF) 100 MCG/2ML IJ SOLN
INTRAMUSCULAR | Status: AC | PRN
  Administered 2016-03-21: 25 ug via INTRAVENOUS
  Administered 2016-03-21: 50 ug via INTRAVENOUS
  Administered 2016-03-21: 25 ug via INTRAVENOUS
  Administered 2016-03-21: 50 ug via INTRAVENOUS

## 2016-03-21 MED ORDER — MIDAZOLAM HCL 2 MG/2ML IJ SOLN
INTRAMUSCULAR | Status: AC
  Filled 2016-03-21: qty 6

## 2016-03-21 NOTE — Progress Notes (Addendum)
PHARMACY - ADULT TOTAL PARENTERAL NUTRITION CONSULT NOTE   Pharmacy Consult for TPN Indication: Crohn's disease on bowel rest with multiple abscesses   Patient Measurements: Height: 6' (182.9 cm) Weight: 173 lb (78.5 kg) IBW/kg (Calculated) : 77.6 TPN AdjBW (KG): 78.5 Body mass index is 23.46 kg/m. Usual Weight: 95kg (7 mo ago)  Insulin Requirements: 1 unit SSI required/24h  Current Nutrition: NPO  IVF: NS at 20 ml/hr  Central access: PICC placed 1/20 TPN start date: 1/20  ASSESSMENT                                                                                                     HPI:  54 YOM with history of Crohn's disease presents 1/19 to [facility] ED with c/o abd pain.  CT reveals active Crohn's and RLQ abscesses and fluid collection. These are not amendable to IR drainage.  GI has stated it was reasonable to start TPN since will be NPO and will have follow- scans week of 1/22 to determine if will need surgical intervention  Significant events:  1/23: CTscan abd/pelvis = severe wall thickening of terminal ileum w/ small amount loculated gas c/w microperforation and enlargement of transverse abdominis and psoas muscle abscess 1/24: plan for IR drain placement for RLQ abscess  Today:    Glucose - no PMH of DM. CBGs controlled with minimal SSI use  Electrolytes - Na sl low, corr Ca WNL  Renal -WNL  LFTs - low  TGs - WNL 98 (1/21)  Prealbumin - 13.6 (1/21)  NUTRITIONAL GOALS                                                                                             RD recs: 2125 - 2480 kcals, 95-110 gm protein Clinimix 5/20 at a goal rate of 18m/hr + 20% fat emulsion at 2102mhr over 12h to provide: 96g/day protein, 2169Kcal/day.  All Clinimix products are currently on backorder.  Pharmacy will use Clinimix product based on what we have available in stock that will most closely match patient's needs.  Formulation changes will be communicated in progress notes.  PLAN  At 1800 today:  Continue Clinimix E 5/20 to 6m/hr (goal)  20% fat emulsion at 219mhr over 12h   TPN to contain standard multivitamins daily and trace elements every other day.  This is due to national backorder of trace elements   Continue IVF to 2052mr.  Decrease sensitive SSI to q8h as he is requiring minimal coverage with TPN at goal.   TPN lab panels on Mondays & Thursdays, check BMET in AM.  F/u daily.  DusDoreene ElandharmD, BCPS.   Pager: [phone] AM

## 2016-03-21 NOTE — Procedures (Signed)
RLQ 12 Fr abscess drain Pus No comp/EBL

## 2016-03-21 NOTE — Progress Notes (Signed)
Subjective: He reports more pain today, not really any better.  Planning on IR to see and will try to place a drain.    Objective: Vital signs in last 24 hours: Temp:  [98.3 F (36.8 C)-101.5 F (38.6 C)] 98.8 F (37.1 C) (01/24 0534) Pulse Rate:  [90-99] 90 (01/24 0534) Resp:  [16-18] 16 (01/24 0534) BP: (98-115)/(56-60) 98/56 (01/24 0534) SpO2:  [98 %-99 %] 98 % (01/24 0534) Last BM Date: 03/20/16  Intake/Output from previous day: 01/23 0701 - 01/24 0700 In: 1161 [I.V.:1111; IV Piggyback:50] Out: 4656 [Urine:1850] Intake/Output this shift: No intake/output data recorded.  General appearance: alert, cooperative and no distress GI: remains tender RLQ  Lab Results:   Recent Labs  03/21/16 0542 03/21/16 0700  WBC QUESTIONABLE RESULTS, RECOMMEND RECOLLECT TO VERIFY 12.0*  HGB QUESTIONABLE RESULTS, RECOMMEND RECOLLECT TO VERIFY 9.2*  HCT QUESTIONABLE RESULTS, RECOMMEND RECOLLECT TO VERIFY 29.0*  PLT QUESTIONABLE RESULTS, RECOMMEND RECOLLECT TO VERIFY 382    BMET  Recent Labs  03/20/16 0534 03/21/16 0700  NA 138 134*  K 3.7 3.7  CL 107 104  CO2 24 23  GLUCOSE 124* 125*  BUN 12 12  CREATININE 0.71 0.77  CALCIUM 8.4* 8.2*   PT/INR  Recent Labs  03/21/16 0542 03/21/16 0700  LABPROT QUESTIONABLE RESULTS, RECOMMEND RECOLLECT TO VERIFY 16.5*  INR NOT CALCULATED 1.33     Recent Labs Lab 03/16/16 0101 03/18/16 0800 03/19/16 0530  AST 13* 11* 12*  ALT 12* 9* 8*  ALKPHOS 39 31* 30*  BILITOT 0.4 0.6 0.4  PROT 7.8 6.6 7.0  ALBUMIN 3.3* 2.7* 2.9*     Lipase     Component Value Date/Time   LIPASE 19 03/16/2016 0101     Studies/Results: Ct Abdomen Pelvis W Contrast  Result Date: 03/20/2016 CLINICAL DATA:  Right lower quadrant abdominal pain. History of Crohn' s disease. EXAM: CT ABDOMEN AND PELVIS WITH CONTRAST TECHNIQUE: Multidetector CT imaging of the abdomen and pelvis was performed using the standard protocol following bolus administration of  intravenous contrast. CONTRAST:  76m ISOVUE-300 IOPAMIDOL (ISOVUE-300) INJECTION 61% COMPARISON:  03/16/2016 CT scan FINDINGS: Lower chest: Linear subsegmental atelectasis in the posterior basal segment right lower lobe. Density in the cavoatrial junction is probably a central line, correlate with patient history. Hepatobiliary: Unremarkable Pancreas: Unremarkable Spleen: Unremarkable Adrenals/Urinary Tract: Unremarkable Stomach/Bowel: Similar to prior there is a 14 cm region of severe wall thickening in the terminal ileum with abscess tracking along the right transverse abdominis muscle in the right retroperitoneum to involve the right psoas muscle, volume of abscess approximately 80 cc and containing gas and fluid with thick enhancing margins, this is increased in size compared to the prior exam. On images 56-57 of series 2 there small locules of extraluminal gas medial to the terminal ileum compatible with contained perforation or small microabscesses. Air-fluid level in the contrast in the rectum. Vascular/Lymphatic: Small reactive right inguinal and pelvic lymph nodes. Reproductive: Unremarkable Other: No supplemental non-categorized findings. Musculoskeletal: Sacroiliac joints unremarkable. Partially calcified annulus fibrosis along a disc bulge at L5-S1. IMPRESSION: 1. Severe wall thickening in the distal 14 cm of the terminal ileum, with small amounts of adjacent extraluminal loculated gas containing compatible with micro perforation, and with enlargement of the right transverse abdominis and psoas muscle abscess which is approximately 80 cc in volume. Electronically Signed   By: WVan ClinesM.D.   On: 03/20/2016 12:12    Medications: . enoxaparin (LOVENOX) injection  40 mg Subcutaneous Q24H  . insulin  aspart  0-9 Units Subcutaneous Q8H  . pantoprazole  40 mg Oral Daily  . piperacillin-tazobactam (ZOSYN)  IV  3.375 g Intravenous Q8H  . saccharomyces boulardii  250 mg Oral BID  . sodium  chloride flush  10-40 mL Intracatheter Q12H    Assessment/Plan Crohn's colitis with fluid collections - terminal illeitis - not amenable to IR drain On Imuran/Prednisone at home FEN: TNA/ice chips ID: [id number] 03/15/16 =>> day 7 DVT: Lovenox  Plan:  Continue antibiotics, and IR will try to drain.     LOS: 5 days    Larry [name] 03/21/2016 [phone]

## 2016-03-21 NOTE — Progress Notes (Signed)
PROGRESS NOTE                                                                                                                                                                                                             Patient Demographics:    Larry [name], is a 36 y.o. male, DOB - 1980-05-07, JFH:[id number]  Admit date - 03/16/2016   Admitting Physician Norval Morton, MD  Outpatient Primary MD for the patient is Pcp Not In System  LOS - 5   Chief Complaint  Patient presents with  . Abdominal Pain       Brief Narrative   36 y.o. male with medical history significant of Crohn's colitis on azathioprine originally diagnosed in 2015; who presents with waxing and waning sharp right lower quadrant abdominal pain., Recent Crohn's flare in August 2017, still on prednisone taper, admitted for Crohn's exacerbation with abscess.   Subjective:    Larry [name] was seen this morning prior to procedure by IR. Complained of intermittent RLQ pain 6-7/10 which improved to 2/10 after pain medications. BM 1. Denied any other complaints. As per RN, no acute issues.   Assessment  & Plan :    Principal Problem:   Crohn's colitis, with abscess ([place]) Active Problems:   Crohn's colitis ([place])   Leukocytosis   Anemia   Abnormal CT of the abdomen   Crohn's disease of ileum with abscess ([facility])   Crohn's Terminal ileitis, with abscess - Patient with known history of Crohn's disease, CT abdomen pelvis significant for Crohn's colitis with abscess. - Continue with IV Zosyn. - GI input greatly appreciated, continue nothing by mouth, bowel rest, and IV Zosyn, anticipated prolonged course of bowel rest,PICC line inserted 1/20, and started on TPN. - surgery input regarding abscesses appreciated, not initially amendable to drainage by IR, cont with IV Zosyn, repeat CT abdomen pelvis 1/23 showing severe ileitis, as well enlargement of transverse abdomen and psoas muscle  abscess. - IR placed CT-guided drain on 03/21/16. As per GI follow-up, this was the second time that the patient has had drainage of Crohn's related abscess and when infection is optimally controlled, they think that the next step is ileal resection followed by outpatient GI management of his Crohn's after recovering from surgery.  Anemia - Initial hemoglobin on to be 10.6. Trending down secondary to dilution,  Most likely anemia of chronic disease - Continue to monitor . Stable.    Thrombocytosis - likely reactive . Resolved.     Code Status : Full  Family Communication  : None at bedside  Disposition Plan  : home when stable  Consults  :  GI, Gen surgery, IR  Procedures  : CT-guided drain catheter placement into abscess by IR on 1/24., R upper arm PICC line.  DVT Prophylaxis  :  Lovenox -SCDs   Lab Results  Component Value Date   PLT 382 03/21/2016    Antibiotics  :    Anti-infectives    Start     Dose/Rate Route Frequency Ordered Stop   03/16/16 1400  piperacillin-tazobactam (ZOSYN) IVPB 3.375 g     3.375 g 12.5 mL/hr over 240 Minutes Intravenous Every 8 hours 03/16/16 0548     03/16/16 0415  piperacillin-tazobactam (ZOSYN) IVPB 3.375 g     3.375 g 12.5 mL/hr over 240 Minutes Intravenous  Once 03/16/16 0411 03/16/16 0844        Objective:   Vitals:   03/21/16 1630 03/21/16 1636 03/21/16 1642 03/21/16 1645  BP: (!) 100/54 (!) 95/57 106/76 99/60  Pulse: (!) 104 (!) 105 (!) 113 (!) 105  Resp: (!) 23 (!) 23 17 (!) 29  Temp:      TempSrc:      SpO2: 98% 98% 99% 99%  Weight:      Height:        Wt Readings from Last 3 Encounters:  03/16/16 78.5 kg (173 lb)     Intake/Output Summary (Last 24 hours) at 03/21/16 1724 Last data filed at 03/21/16 1430  Gross per 24 hour  Intake               50 ml  Output             2050 ml  Net            -2000 ml     Physical Exam  Gen. exam: Awake Alert, Oriented X 3,  Neck: Supple Neck,No JVD,  Respiratory  system: Symmetrical Chest wall movement, Good air movement bilaterally, CTAB CVS: RRR,No Gallops,Rubs or new Murmurs, No Parasternal Heave  GI: +ve B.Sounds, Abd Soft, mild tenderness in right lower quadrant, , No rebound - guarding or rigidity. Extremities: No Cyanosis, Clubbing or edema, No new Rash or bruise   CNS: Alert and oriented. No focal deficits.    Data Review:    CBC  Recent Labs Lab 03/16/16 0101  03/18/16 0800 03/19/16 0530 03/20/16 0534 03/21/16 0542 03/21/16 0700  WBC 12.3*  < > 12.2* 10.8* 12.7* QUESTIONABLE RESULTS, RECOMMEND RECOLLECT TO VERIFY 12.0*  HGB 10.6*  < > 8.8* 9.2* 9.2* QUESTIONABLE RESULTS, RECOMMEND RECOLLECT TO VERIFY 9.2*  HCT 33.0*  < > 28.2* 29.4* 29.1* QUESTIONABLE RESULTS, RECOMMEND RECOLLECT TO VERIFY 29.0*  PLT 483*  < > 411* 411* 368 QUESTIONABLE RESULTS, RECOMMEND RECOLLECT TO VERIFY 382  MCV 81.5  < > 80.6 80.1 81.3 QUESTIONABLE RESULTS, RECOMMEND RECOLLECT TO VERIFY 79.2  MCH 26.2  < > 25.1* 25.1* 25.7* QUESTIONABLE RESULTS, RECOMMEND RECOLLECT TO VERIFY 25.1*  MCHC 32.1  < > 31.2 31.3 31.6 QUESTIONABLE RESULTS, RECOMMEND RECOLLECT TO VERIFY 31.7  RDW 14.1  < > 14.3 14.0 14.1 QUESTIONABLE RESULTS, RECOMMEND RECOLLECT TO VERIFY 13.7  LYMPHSABS 1.6  --  1.2  --   --   --   --   MONOABS 1.2*  --  1.0  --   --   --   --  EOSABS 0.1  --  0.0  --   --   --   --   BASOSABS 0.0  --  0.0  --   --   --   --   < > = values in this interval not displayed.  Chemistries   Recent Labs Lab 03/16/16 0101 03/17/16 0350 03/17/16 0351 03/18/16 0800 03/19/16 0530 03/20/16 0534 03/21/16 0700  NA 135  --  136 135 138 138 134*  K 3.7  --  3.9 3.5 3.6 3.7 3.7  CL 102  --  105 102 106 107 104  CO2 25  --  24 26 27 24 23   GLUCOSE 96  --  84 99 107* 124* 125*  BUN 12  --  14 12 11 12 12   CREATININE 0.78  --  0.87 0.77 0.74 0.71 0.77  CALCIUM 8.8*  --  8.7* 8.3* 8.5* 8.4* 8.2*  MG  --  1.9  --  1.8 2.0 2.0  --   AST 13*  --   --  11* 12*  --   --    ALT 12*  --   --  9* 8*  --   --   ALKPHOS 39  --   --  31* 30*  --   --   BILITOT 0.4  --   --  0.6 0.4  --   --    ------------------------------------------------------------------------------------------------------------------ No results for input(s): CHOL, HDL, LDLCALC, TRIG, CHOLHDL, LDLDIRECT in the last 72 hours.  No results found for: HGBA1C ------------------------------------------------------------------------------------------------------------------ No results for input(s): TSH, T4TOTAL, T3FREE, THYROIDAB in the last 72 hours.  Invalid input(s): FREET3 ------------------------------------------------------------------------------------------------------------------ No results for input(s): VITAMINB12, FOLATE, FERRITIN, TIBC, IRON, RETICCTPCT in the last 72 hours.  Coagulation profile  Recent Labs Lab 03/21/16 0542 03/21/16 0700  INR NOT CALCULATED 1.33    No results for input(s): DDIMER in the last 72 hours.  Cardiac Enzymes No results for input(s): CKMB, TROPONINI, MYOGLOBIN in the last 168 hours.  Invalid input(s): CK ------------------------------------------------------------------------------------------------------------------ No results found for: BNP  Inpatient Medications  Scheduled Meds: . enoxaparin (LOVENOX) injection  40 mg Subcutaneous Q24H  . fentaNYL      . insulin aspart  0-9 Units Subcutaneous Q8H  . midazolam      . pantoprazole  40 mg Oral Daily  . piperacillin-tazobactam (ZOSYN)  IV  3.375 g Intravenous Q8H  . saccharomyces boulardii  250 mg Oral BID  . sodium chloride flush  10-40 mL Intracatheter Q12H   Continuous Infusions: . Marland KitchenTPN (CLINIMIX-E) Adult Stopped (03/21/16 1715)  . sodium chloride 20 mL/hr at 03/20/16 1039  . Marland KitchenTPN (CLINIMIX-E) Adult 80 mL/hr at 03/21/16 1717   And  . fat emulsion 240 mL (03/21/16 1717)   PRN Meds:.acetaminophen, albuterol, iopamidol, iopamidol, morphine injection, ondansetron **OR** ondansetron  (ZOFRAN) IV, sodium chloride flush  Micro Results No results found for this or any previous visit (from the past 240 hour(s)).  Radiology Reports Ct Abdomen Pelvis W Contrast  Result Date: 03/20/2016 CLINICAL DATA:  Right lower quadrant abdominal pain. History of Crohn' s disease. EXAM: CT ABDOMEN AND PELVIS WITH CONTRAST TECHNIQUE: Multidetector CT imaging of the abdomen and pelvis was performed using the standard protocol following bolus administration of intravenous contrast. CONTRAST:  16m ISOVUE-300 IOPAMIDOL (ISOVUE-300) INJECTION 61% COMPARISON:  03/16/2016 CT scan FINDINGS: Lower chest: Linear subsegmental atelectasis in the posterior basal segment right lower lobe. Density in the cavoatrial junction is probably a central line, correlate with patient history. Hepatobiliary: Unremarkable  Pancreas: Unremarkable Spleen: Unremarkable Adrenals/Urinary Tract: Unremarkable Stomach/Bowel: Similar to prior there is a 14 cm region of severe wall thickening in the terminal ileum with abscess tracking along the right transverse abdominis muscle in the right retroperitoneum to involve the right psoas muscle, volume of abscess approximately 80 cc and containing gas and fluid with thick enhancing margins, this is increased in size compared to the prior exam. On images 56-57 of series 2 there small locules of extraluminal gas medial to the terminal ileum compatible with contained perforation or small microabscesses. Air-fluid level in the contrast in the rectum. Vascular/Lymphatic: Small reactive right inguinal and pelvic lymph nodes. Reproductive: Unremarkable Other: No supplemental non-categorized findings. Musculoskeletal: Sacroiliac joints unremarkable. Partially calcified annulus fibrosis along a disc bulge at L5-S1. IMPRESSION: 1. Severe wall thickening in the distal 14 cm of the terminal ileum, with small amounts of adjacent extraluminal loculated gas containing compatible with micro perforation, and with  enlargement of the right transverse abdominis and psoas muscle abscess which is approximately 80 cc in volume. Electronically Signed   By: Van Clines M.D.   On: 03/20/2016 12:12   Ct Abdomen Pelvis W Contrast  Result Date: 03/16/2016 CLINICAL DATA:  Abdominal pain. Right lower quadrant pain. Weight loss. Crohn's disease. EXAM: CT ABDOMEN AND PELVIS WITH CONTRAST TECHNIQUE: Multidetector CT imaging of the abdomen and pelvis was performed using the standard protocol following bolus administration of intravenous contrast. CONTRAST:  132m ISOVUE-300 IOPAMIDOL (ISOVUE-300) INJECTION 61% COMPARISON:  [facility] FINDINGS: Lower chest: The lung bases are clear. Hepatobiliary: No focal liver abnormality is seen. No gallstones, gallbladder wall thickening, or biliary dilatation. Pancreas: No ductal dilatation or inflammation. Spleen: Normal in size without focal abnormality. Adrenals/Urinary Tract: Normal adrenal glands. Symmetric renal enhancement without hydronephrosis. Urinary bladder is physiologically distended. Stomach/Bowel: Wall thickening involving the terminal ileum and cecum with moderate adjacent mesenteric inflammation consistent with active Crohn's. There multiple adjacent irregular fluid collections with peripheral enhancement in the right lower quadrant consistent with abscess. These involve both the adjacent fat and right iliopsoas muscle. A discrete collection anteriorly measures 2.8 x 1.7 cm. This may be contiguous with the more posterior collection measuring 2.5 x 4.7 cm, which involves the iliopsoas muscle. The appendix is tentatively identified containing intraluminal fluid with similar stranding to the adjacent inflamed cecum and ascending colon. No other bowel wall thickening to suggest additional sites of active Crohn's. Moderate stool in the more distal colon. Vascular/Lymphatic: No significant vascular findings are present. No enlarged  abdominal or pelvic lymph nodes. Reproductive: Prostate is unremarkable. Other: Right lower quadrant abscesses as described. No pelvic free fluid. No free air. Musculoskeletal: Heterogeneous edema of the right iliopsoas muscle with small intramuscular abscess as described. Mild heterogeneous enlargement of the distal iliopsoas likely myositis. There are no acute or suspicious osseous abnormalities. IMPRESSION: Active Crohn's involving the terminal ileum and cecum. There are adjacent irregular abscesses in the right lower quadrant involving the adjacent fat and iliopsoas muscle. Fluid collections are irregular in shape, and measure approximately 2.8 and 4.7 cm respectively. Electronically Signed   By: MJeb LeveringM.D.   On: 03/16/2016 03:24   Ct Image Guided Drainage By Percutaneous Catheter  Result Date: 03/21/2016 INDICATION: Right lower quadrant abscess EXAM: CT GUIDED DRAINAGE OF A RIGHT LOWER QUADRANT ABSCESS MEDICATIONS: The patient is currently admitted to the hospital and receiving intravenous antibiotics. The antibiotics were administered within an appropriate time frame prior to the initiation of the procedure. ANESTHESIA/SEDATION:  mg IV Versed  mcg IV Fentanyl Moderate Sedation Time: The patient was continuously monitored during the procedure by the interventional radiology nurse under my direct supervision. COMPLICATIONS: None immediate. TECHNIQUE: Informed written consent was obtained from the patient after a thorough discussion of the procedural risks, benefits and alternatives. All questions were addressed. Maximal Sterile Barrier Technique was utilized including caps, mask, sterile gowns, sterile gloves, sterile drape, hand hygiene and skin antiseptic. A timeout was performed prior to the initiation of the procedure. PROCEDURE: The right lower quadrant was prepped with ChloraPrep in a sterile fashion, and a sterile drape was applied covering the operative field. A sterile gown and sterile  gloves were used for the procedure. Local anesthesia was provided with 1% Lidocaine. Under CT guidance, an 18 gauge needle was inserted into the right lower quadrant abscess. It was removed over an Amplatz wire. Twelve [place] dilator followed by a 12 [place] drain were inserted. The drain was looped and string fixed in the fluid collection. 30 cc frank pus was aspirated. It was sewn to the skin. FINDINGS: Imaging documents placement of a 81 French right lower quadrant abscess drain. IMPRESSION: Successful CT-guided right lower quadrant abscess drain. Electronically Signed   By: Marybelle Killings M.D.   On: 03/21/2016 17:09     Lurlean Kernen, MD, FACP, FHM. [facility] Pager [phone]  If 7PM-7AM, please contact night-coverage [email or web address] Password [facility] 03/21/2016, 5:31 PM

## 2016-03-21 NOTE — Progress Notes (Signed)
Vs  1420 T 100.6 -104-18 BP 87/46. Manual BP 90/80. MR Biondolillo is asymptomatic. I have spoke with Dr . Algis Liming .No orders received.

## 2016-03-21 NOTE — Progress Notes (Signed)
[facility] Progress Note  Chief Complaint:   Crohn's disease  Subjective: A little more RLQ discomfort today. No nausea.   Objective:  Vital signs in last 24 hours: Temp:  [98.3 F (36.8 C)-101.5 F (38.6 C)] 98.8 F (37.1 C) (01/24 0534) Pulse Rate:  [90-99] 90 (01/24 0534) Resp:  [16-18] 16 (01/24 0534) BP: (98-115)/(56-60) 98/56 (01/24 0534) SpO2:  [98 %-99 %] 98 % (01/24 0534) Last BM Date: 03/20/16 General:   Alert, well-developed,  Black male  in NAD EENT:  Normal hearing, non icteric sclera, conjunctive pink.  Heart:  Regular rate and rhythm; no murmurs. No lower extremity edema Pulm: Normal respiratory effort, lungs CTA bilaterally without wheezes or crackles. Abdomen:  Soft, mildly distended. Mild to moderate RLQ tenderness.    Neurologic:  Alert and  oriented x4;  grossly normal neurologically. Psych:  Alert and cooperative. Normal mood and affect.   Intake/Output from previous day: 01/23 0701 - 01/24 0700 In: 1161 [I.V.:1111; IV Piggyback:50] Out: 5697 [Urine:1850] Intake/Output this shift: No intake/output data recorded.  Lab Results:  Recent Labs  03/20/16 0534 03/21/16 0542 03/21/16 0700  WBC 12.7* QUESTIONABLE RESULTS, RECOMMEND RECOLLECT TO VERIFY 12.0*  HGB 9.2* QUESTIONABLE RESULTS, RECOMMEND RECOLLECT TO VERIFY 9.2*  HCT 29.1* QUESTIONABLE RESULTS, RECOMMEND RECOLLECT TO VERIFY 29.0*  PLT 368 QUESTIONABLE RESULTS, RECOMMEND RECOLLECT TO VERIFY 382   BMET  Recent Labs  03/19/16 0530 03/20/16 0534 03/21/16 0700  NA 138 138 134*  K 3.6 3.7 3.7  CL 106 107 104  CO2 27 24 23   GLUCOSE 107* 124* 125*  BUN 11 12 12   CREATININE 0.74 0.71 0.77  CALCIUM 8.5* 8.4* 8.2*   LFT  Recent Labs  03/19/16 0530  PROT 7.0  ALBUMIN 2.9*  AST 12*  ALT 8*  ALKPHOS 30*  BILITOT 0.4   PT/INR  Recent Labs  03/21/16 0542 03/21/16 0700  LABPROT QUESTIONABLE RESULTS, RECOMMEND RECOLLECT TO VERIFY 16.5*  INR NOT CALCULATED 1.33    Ct Abdomen Pelvis W Contrast  Result Date: 03/20/2016 CLINICAL DATA:  Right lower quadrant abdominal pain. History of Crohn' s disease. EXAM: CT ABDOMEN AND PELVIS WITH CONTRAST TECHNIQUE: Multidetector CT imaging of the abdomen and pelvis was performed using the standard protocol following bolus administration of intravenous contrast. CONTRAST:  58m ISOVUE-300 IOPAMIDOL (ISOVUE-300) INJECTION 61% COMPARISON:  03/16/2016 CT scan FINDINGS: Lower chest: Linear subsegmental atelectasis in the posterior basal segment right lower lobe. Density in the cavoatrial junction is probably a central line, correlate with patient history. Hepatobiliary: Unremarkable Pancreas: Unremarkable Spleen: Unremarkable Adrenals/Urinary Tract: Unremarkable Stomach/Bowel: Similar to prior there is a 14 cm region of severe wall thickening in the terminal ileum with abscess tracking along the right transverse abdominis muscle in the right retroperitoneum to involve the right psoas muscle, volume of abscess approximately 80 cc and containing gas and fluid with thick enhancing margins, this is increased in size compared to the prior exam. On images 56-57 of series 2 there small locules of extraluminal gas medial to the terminal ileum compatible with contained perforation or small microabscesses. Air-fluid level in the contrast in the rectum. Vascular/Lymphatic: Small reactive right inguinal and pelvic lymph nodes. Reproductive: Unremarkable Other: No supplemental non-categorized findings. Musculoskeletal: Sacroiliac joints unremarkable. Partially calcified annulus fibrosis along a disc bulge at L5-S1. IMPRESSION: 1. Severe wall thickening in the distal 14 cm of the terminal ileum, with small amounts of adjacent extraluminal loculated gas containing compatible with micro perforation, and with enlargement of the  right transverse abdominis and psoas muscle abscess which is approximately 80 cc in volume. Electronically Signed   By: Van Clines M.D.   On: 03/20/2016 12:12    Assessment / Plan:  1. Small bowel Crohn's admitted with RLQ abscesses. On bowel test, TNA and Zosyn.  Having more RLQ discomfort than yesterday. WBC still mid 12 range. He is febrile at 101.5. Repeat CTscan yesterday shows severe wall thickening of distal TI with microperforation and enlargement of the right transverse abdominal and psoas muscle abscess.  R has seen and patient scheduled for drainage tomorrow. Longterm plan with be to start biologics.   2. Normocytic anemia, stable.    Principal Problem:   Crohn's colitis, with abscess ([place]) Active Problems:   Crohn's colitis ([place])   Leukocytosis   Anemia   Abnormal CT of the abdomen   Crohn's disease of ileum with abscess ([place])   LOS: 5 days   Tye Savoy NP  03/21/2016, 12:22 PM Pager number [phone]  GI ATTENDING  Patient clinically stable. CT with long disease segment of diseased ileum and organizing abscess for which drainage is planned. This with a second time that the patient has had drainage of Crohn's related abscess. When infection optimally controlled, I think the patient's next step is ileal resection, followed by outpatient GI management of his Crohn's after recovering from surgery.  Docia Chuck. Geri [place]in[place]., M.D. [facility] Division of Gastroenterology

## 2016-03-22 DIAGNOSIS — D649 Anemia, unspecified: Secondary | ICD-10-CM

## 2016-03-22 LAB — GLUCOSE, CAPILLARY
GLUCOSE-CAPILLARY: 137 mg/dL — AB (ref 65–99)
Glucose-Capillary: 136 mg/dL — ABNORMAL HIGH (ref 65–99)
Glucose-Capillary: 135 mg/dL — ABNORMAL HIGH (ref 65–99)
Glucose-Capillary: 141 mg/dL — ABNORMAL HIGH (ref 65–99)

## 2016-03-22 LAB — COMPREHENSIVE METABOLIC PANEL
ALT: 11 U/L — ABNORMAL LOW (ref 17–63)
AST: 16 U/L (ref 15–41)
Albumin: 3 g/dL — ABNORMAL LOW (ref 3.5–5.0)
Alkaline Phosphatase: 35 U/L — ABNORMAL LOW (ref 38–126)
Anion gap: 6 (ref 5–15)
BUN: 12 mg/dL (ref 6–20)
CHLORIDE: 103 mmol/L (ref 101–111)
CO2: 26 mmol/L (ref 22–32)
Calcium: 8.6 mg/dL — ABNORMAL LOW (ref 8.9–10.3)
Creatinine, Ser: 0.56 mg/dL — ABNORMAL LOW (ref 0.61–1.24)
GFR calc Af Amer: >60 mL/min (ref >60)
Glucose, Bld: 141 mg/dL — ABNORMAL HIGH (ref 65–99)
POTASSIUM: 4.2 mmol/L (ref 3.5–5.1)
Sodium: 135 mmol/L (ref 135–145)
Total Bilirubin: 0.4 mg/dL (ref 0.3–1.2)
Total Protein: 7.8 g/dL (ref 6.5–8.1)

## 2016-03-22 LAB — PHOSPHORUS: PHOSPHORUS: 3.1 mg/dL (ref 2.5–4.6)

## 2016-03-22 LAB — MAGNESIUM: MAGNESIUM: 2.1 mg/dL (ref 1.7–2.4)

## 2016-03-22 MED ORDER — FAT EMULSION 20 % IV EMUL
240.0000 mL | INTRAVENOUS | Status: AC
  Administered 2016-03-22: 240 mL via INTRAVENOUS
  Filled 2016-03-22: qty 250

## 2016-03-22 MED ORDER — CLINIMIX E/DEXTROSE (5/20) 5 % IV SOLN
80.0000 mL/h | INTRAVENOUS | Status: AC
  Administered 2016-03-22: 17:00:00 via INTRAVENOUS
  Filled 2016-03-22: qty 1920

## 2016-03-22 MED ORDER — INSULIN ASPART 100 UNIT/ML ~~LOC~~ SOLN: 0.0000 [IU] | Freq: Three times a day (TID) | SUBCUTANEOUS | Status: DC

## 2016-03-22 NOTE — Progress Notes (Signed)
Nutrition Follow-up  INTERVENTION:   TPN per Pharmacy  Recommend recording new weight , last recorded 1/19.  RD will continue to monitor  NUTRITION DIAGNOSIS:   Unintentional weight loss related to chronic illness, altered GI function (Crohn's disease) as evidenced by per patient/family report, percent weight loss.  Ongoing.  GOAL:   Patient will meet greater than or equal to 90% of their needs  Meeting with TPN.  MONITOR:   Labs, Weight trends, I & O's, Other (Comment) (TPN)  ASSESSMENT:   36 y.o. male with medical history significant of Crohn's colitis on azathioprine originally diagnosed in 2015; who presents with waxing and waning sharp right lower quadrant abdominal pain., Recent Crohn's flare in August 2017, still on prednisone taper, admitted for Crohn's exacerbation with abscess. 1/24: s/p CT guided drainage of abscess  Patient reports not eating anything since 1/18. RD noted diet was advanced to regular overnight but then was changed back to NPO this morning. Pt states he feels hungry and ready to start eating again. Pt needs a new weight as well, last recorded on 1/19.   Medications: Protonix tablet daily, Florastor capsule BID Labs reviewed: CBGs: 135-136 Mg/Phos WNL  Plan per Pharmacy 1/25: At 1800 today:  Continue Clinimix E 5/20 to 20m/hr (goal)  20% fat emulsion at 211mhr over 12h   Diet Order:  TPN (CLINIMIX-E) Adult TPN (CLINIMIX-E) Adult Diet NPO time specified Except for: Ice Chips, Sips with Meds  Skin:  Reviewed, no issues  Last BM:  1/23  Height:   Ht Readings from Last 1 Encounters:  03/16/16 6' (1.829 m)    Weight:   Wt Readings from Last 1 Encounters:  03/16/16 173 lb (78.5 kg)    Ideal Body Weight:  80.9 kg  BMI:  Body mass index is 23.46 kg/m.  Estimated Nutritional Needs:   Kcal:  2125-2480 (MSJ x 1.2-1.4)  Protein:  95-110 grams (1.2-1.4 grams/kg)  Fluid:  2.3 L/day (30 ml/kg)  EDUCATION NEEDS:   No  education needs identified at this time  LiClayton BiblesMS, RD, LDN Pager: [phone] Hours Pager: [phone]

## 2016-03-22 NOTE — Progress Notes (Signed)
[facility] Progress Note  Chief Complaint:   Crohn's with RLQ abscess  Subjective: Feels much better after abscess drainage, no abdominal pain at all. Last BM a couple of days ago  Objective:  Vital signs in last 24 hours: Temp:  [98 F (36.7 C)-101.6 F (38.7 C)] 98 F (36.7 C) (01/25 0526) Pulse Rate:  [83-118] 83 (01/25 0526) Resp:  [16-29] 16 (01/25 0526) BP: (87-111)/(46-76) 97/61 (01/25 0526) SpO2:  [97 %-100 %] 97 % (01/25 0526) Last BM Date: 03/20/16 (RLQ) General:   Alert, well-developed, black male in NAD EENT:  Normal hearing, non icteric sclera, conjunctive pink.  Heart:  Regular rate and rhythm; no murmurs. No lower extremity edema Pulm: Normal respiratory effort. Abdomen:  Soft, nondistended, nontender.  Normal bowel sounds, no masses felt. No hepatomegaly.    Neurologic:  Alert and  oriented x4;  grossly normal neurologically. Psych:  Alert and cooperative. Normal mood and affect.   Intake/Output from previous day: 01/24 0701 - 01/25 0700 In: 78 [IV Piggyback:50] Out: 1640 [Urine:1600; Drains:40] Intake/Output this shift: Total I/O In: 5 [Other:5] Out: 30 [Drains:30]  Lab Results:  Recent Labs  03/20/16 0534 03/21/16 0542 03/21/16 0700  WBC 12.7* QUESTIONABLE RESULTS, RECOMMEND RECOLLECT TO VERIFY 12.0*  HGB 9.2* QUESTIONABLE RESULTS, RECOMMEND RECOLLECT TO VERIFY 9.2*  HCT 29.1* QUESTIONABLE RESULTS, RECOMMEND RECOLLECT TO VERIFY 29.0*  PLT 368 QUESTIONABLE RESULTS, RECOMMEND RECOLLECT TO VERIFY 382   BMET  Recent Labs  03/20/16 0534 03/21/16 0700 03/22/16 0822  NA 138 134* 135  K 3.7 3.7 4.2  CL 107 104 103  CO2 24 23 26   GLUCOSE 124* 125* 141*  BUN 12 12 12   CREATININE 0.71 0.77 0.56*  CALCIUM 8.4* 8.2* 8.6*   LFT  Recent Labs  03/22/16 0822  PROT 7.8  ALBUMIN 3.0*  AST 16  ALT 11*  ALKPHOS 35*  BILITOT 0.4   PT/INR  Recent Labs  03/21/16 0542 03/21/16 0700  LABPROT QUESTIONABLE RESULTS,  RECOMMEND RECOLLECT TO VERIFY 16.5*  INR NOT CALCULATED 1.33   Hepatitis Panel No results for input(s): HEPBSAG, HCVAB, HEPAIGM, HEPBIGM in the last 72 hours.  Ct Abdomen Pelvis W Contrast  Result Date: 03/20/2016 CLINICAL DATA:  Right lower quadrant abdominal pain. History of Crohn' s disease. EXAM: CT ABDOMEN AND PELVIS WITH CONTRAST TECHNIQUE: Multidetector CT imaging of the abdomen and pelvis was performed using the standard protocol following bolus administration of intravenous contrast. CONTRAST:  37m ISOVUE-300 IOPAMIDOL (ISOVUE-300) INJECTION 61% COMPARISON:  03/16/2016 CT scan FINDINGS: Lower chest: Linear subsegmental atelectasis in the posterior basal segment right lower lobe. Density in the cavoatrial junction is probably a central line, correlate with patient history. Hepatobiliary: Unremarkable Pancreas: Unremarkable Spleen: Unremarkable Adrenals/Urinary Tract: Unremarkable Stomach/Bowel: Similar to prior there is a 14 cm region of severe wall thickening in the terminal ileum with abscess tracking along the right transverse abdominis muscle in the right retroperitoneum to involve the right psoas muscle, volume of abscess approximately 80 cc and containing gas and fluid with thick enhancing margins, this is increased in size compared to the prior exam. On images 56-57 of series 2 there small locules of extraluminal gas medial to the terminal ileum compatible with contained perforation or small microabscesses. Air-fluid level in the contrast in the rectum. Vascular/Lymphatic: Small reactive right inguinal and pelvic lymph nodes. Reproductive: Unremarkable Other: No supplemental non-categorized findings. Musculoskeletal: Sacroiliac joints unremarkable. Partially calcified annulus fibrosis along a disc bulge at L5-S1. IMPRESSION: 1. Severe wall thickening  in the distal 14 cm of the terminal ileum, with small amounts of adjacent extraluminal loculated gas containing compatible with micro  perforation, and with enlargement of the right transverse abdominis and psoas muscle abscess which is approximately 80 cc in volume. Electronically Signed   By: Van Clines M.D.   On: 03/20/2016 12:12   Ct Image Guided Drainage By Percutaneous Catheter  Result Date: 03/21/2016 INDICATION: Right lower quadrant abscess EXAM: CT GUIDED DRAINAGE OF A RIGHT LOWER QUADRANT ABSCESS MEDICATIONS: The patient is currently admitted to the hospital and receiving intravenous antibiotics. The antibiotics were administered within an appropriate time frame prior to the initiation of the procedure. ANESTHESIA/SEDATION: mg IV Versed  mcg IV Fentanyl Moderate Sedation Time: The patient was continuously monitored during the procedure by the interventional radiology nurse under my direct supervision. COMPLICATIONS: None immediate. TECHNIQUE: Informed written consent was obtained from the patient after a thorough discussion of the procedural risks, benefits and alternatives. All questions were addressed. Maximal Sterile Barrier Technique was utilized including caps, mask, sterile gowns, sterile gloves, sterile drape, hand hygiene and skin antiseptic. A timeout was performed prior to the initiation of the procedure. PROCEDURE: The right lower quadrant was prepped with ChloraPrep in a sterile fashion, and a sterile drape was applied covering the operative field. A sterile gown and sterile gloves were used for the procedure. Local anesthesia was provided with 1% Lidocaine. Under CT guidance, an 18 gauge needle was inserted into the right lower quadrant abscess. It was removed over an Amplatz wire. Twelve [place] dilator followed by a 12 [place] drain were inserted. The drain was looped and string fixed in the fluid collection. 30 cc frank pus was aspirated. It was sewn to the skin. FINDINGS: Imaging documents placement of a 57 French right lower quadrant abscess drain. IMPRESSION: Successful CT-guided right lower quadrant abscess  drain. Electronically Signed   By: Marybelle Killings M.D.   On: 03/21/2016 17:09    Assessment / Plan:  Small bowel Crohn's with long segment of severe ileal disease Admitted with RLQ abscesses. Underwent CT guided drain placement yesterday, 30cc pus aspirated at the time. Cultures pending. Patient feels much better today.  Afebrile, WBC improving. Surgery following -continue IV antibiotics, bowel rest, TNA -Eventual biologic but may come to ileal resection beforehand  Principal Problem:   Crohn's colitis, with abscess ([place]) Active Problems:   Crohn's colitis ([place])   Leukocytosis   Anemia   Abnormal CT of the abdomen   Crohn's disease of ileum with abscess ([place])   LOS: 6 days   Tye Savoy NP 03/22/2016, 11:10 AM Pager number [phone] GI ATTENDING  Interval history data reviewed. Patient personally seen and examined. Feeling better after drainage procedure. We discussed potential outcomes of his current problems and treatment options. We will allow clear liquids and continue to monitor clinically and radiographically as indicated.  Docia Chuck. Geri Seminole., M.D. [facility] Division of Gastroenterology

## 2016-03-22 NOTE — Progress Notes (Signed)
Subjective: He feels a little better and not having much pain with the drain.  Drain is old clotted blood and serous.    Objective: Vital signs in last 24 hours: Temp:  [98 F (36.7 C)-101.6 F (38.7 C)] 98 F (36.7 C) (01/25 0526) Pulse Rate:  [83-118] 83 (01/25 0526) Resp:  [16-29] 16 (01/25 0526) BP: (87-111)/(46-76) 97/61 (01/25 0526) SpO2:  [97 %-100 %] 97 % (01/25 0526) Last BM Date: 03/20/16 (RLQ) IV not recorded 1600 urine Drain 40 ml Still running fevers up to 101.6 yesterday, down since that time. Culture pending Intake/Output from previous day: [place] 01/24 0701 - 01/25 0700 In: 27 [IV Piggyback:50] Out: 1640 [Urine:1600; Drains:40] Intake/Output this shift: Total I/O In: 5 [Other:5] Out: 30 [Drains:30]  General appearance: alert, cooperative and no distress GI: soft, less sore RLQ, drain as noted above.  Lab Results:   Recent Labs  03/21/16 0542 03/21/16 0700  WBC QUESTIONABLE RESULTS, RECOMMEND RECOLLECT TO VERIFY 12.0*  HGB QUESTIONABLE RESULTS, RECOMMEND RECOLLECT TO VERIFY 9.2*  HCT QUESTIONABLE RESULTS, RECOMMEND RECOLLECT TO VERIFY 29.0*  PLT QUESTIONABLE RESULTS, RECOMMEND RECOLLECT TO VERIFY 382    BMET  Recent Labs  03/21/16 0700 03/22/16 0822  NA 134* 135  K 3.7 4.2  CL 104 103  CO2 23 26  GLUCOSE 125* 141*  BUN 12 12  CREATININE 0.77 0.56*  CALCIUM 8.2* 8.6*   PT/INR  Recent Labs  03/21/16 0542 03/21/16 0700  LABPROT QUESTIONABLE RESULTS, RECOMMEND RECOLLECT TO VERIFY 16.5*  INR NOT CALCULATED 1.33     Recent Labs Lab 03/16/16 0101 03/18/16 0800 03/19/16 0530 03/22/16 0822  AST 13* 11* 12* 16  ALT 12* 9* 8* 11*  ALKPHOS 39 31* 30* 35*  BILITOT 0.4 0.6 0.4 0.4  PROT 7.8 6.6 7.0 7.8  ALBUMIN 3.3* 2.7* 2.9* 3.0*     Lipase     Component Value Date/Time   LIPASE 19 03/16/2016 0101     Studies/Results: Ct Abdomen Pelvis W Contrast  Result Date: 03/20/2016 CLINICAL DATA:  Right lower quadrant abdominal  pain. History of Crohn' s disease. EXAM: CT ABDOMEN AND PELVIS WITH CONTRAST TECHNIQUE: Multidetector CT imaging of the abdomen and pelvis was performed using the standard protocol following bolus administration of intravenous contrast. CONTRAST:  54m ISOVUE-300 IOPAMIDOL (ISOVUE-300) INJECTION 61% COMPARISON:  03/16/2016 CT scan FINDINGS: Lower chest: Linear subsegmental atelectasis in the posterior basal segment right lower lobe. Density in the cavoatrial junction is probably a central line, correlate with patient history. Hepatobiliary: Unremarkable Pancreas: Unremarkable Spleen: Unremarkable Adrenals/Urinary Tract: Unremarkable Stomach/Bowel: Similar to prior there is a 14 cm region of severe wall thickening in the terminal ileum with abscess tracking along the right transverse abdominis muscle in the right retroperitoneum to involve the right psoas muscle, volume of abscess approximately 80 cc and containing gas and fluid with thick enhancing margins, this is increased in size compared to the prior exam. On images 56-57 of series 2 there small locules of extraluminal gas medial to the terminal ileum compatible with contained perforation or small microabscesses. Air-fluid level in the contrast in the rectum. Vascular/Lymphatic: Small reactive right inguinal and pelvic lymph nodes. Reproductive: Unremarkable Other: No supplemental non-categorized findings. Musculoskeletal: Sacroiliac joints unremarkable. Partially calcified annulus fibrosis along a disc bulge at L5-S1. IMPRESSION: 1. Severe wall thickening in the distal 14 cm of the terminal ileum, with small amounts of adjacent extraluminal loculated gas containing compatible with micro perforation, and with enlargement of the right transverse abdominis and psoas  muscle abscess which is approximately 80 cc in volume. Electronically Signed   By: Van Clines M.D.   On: 03/20/2016 12:12   Ct Image Guided Drainage By Percutaneous Catheter  Result Date:  03/21/2016 INDICATION: Right lower quadrant abscess EXAM: CT GUIDED DRAINAGE OF A RIGHT LOWER QUADRANT ABSCESS MEDICATIONS: The patient is currently admitted to the hospital and receiving intravenous antibiotics. The antibiotics were administered within an appropriate time frame prior to the initiation of the procedure. ANESTHESIA/SEDATION: mg IV Versed  mcg IV Fentanyl Moderate Sedation Time: The patient was continuously monitored during the procedure by the interventional radiology nurse under my direct supervision. COMPLICATIONS: None immediate. TECHNIQUE: Informed written consent was obtained from the patient after a thorough discussion of the procedural risks, benefits and alternatives. All questions were addressed. Maximal Sterile Barrier Technique was utilized including caps, mask, sterile gowns, sterile gloves, sterile drape, hand hygiene and skin antiseptic. A timeout was performed prior to the initiation of the procedure. PROCEDURE: The right lower quadrant was prepped with ChloraPrep in a sterile fashion, and a sterile drape was applied covering the operative field. A sterile gown and sterile gloves were used for the procedure. Local anesthesia was provided with 1% Lidocaine. Under CT guidance, an 18 gauge needle was inserted into the right lower quadrant abscess. It was removed over an Amplatz wire. Twelve [place] dilator followed by a 12 [place] drain were inserted. The drain was looped and string fixed in the fluid collection. 30 cc frank pus was aspirated. It was sewn to the skin. FINDINGS: Imaging documents placement of a 21 French right lower quadrant abscess drain. IMPRESSION: Successful CT-guided right lower quadrant abscess drain. Electronically Signed   By: Marybelle Killings M.D.   On: 03/21/2016 17:09    Medications: . enoxaparin (LOVENOX) injection  40 mg Subcutaneous Q24H  . insulin aspart  0-9 Units Subcutaneous Q8H  . pantoprazole  40 mg Oral Daily  . piperacillin-tazobactam (ZOSYN)  IV   3.375 g Intravenous Q8H  . saccharomyces boulardii  250 mg Oral BID  . sodium chloride flush  10-40 mL Intracatheter Q12H    Assessment/Plan Crohn's colitis with fluid collections - terminal illeitis -  IR drain - 03/21/16 On Imuran/Prednisone at home FEN: TNA/ice chips ID: Zosyn 03/15/16 =>>day 8 DVT: Lovenox   Plan:  Continue to follow, CBC in AM.    LOS: 6 days    JENNINGS,WILLARD 03/22/2016 [phone]

## 2016-03-22 NOTE — Progress Notes (Signed)
PHARMACY - ADULT TOTAL PARENTERAL NUTRITION CONSULT NOTE   Pharmacy Consult for TPN Indication: Crohn's disease on bowel rest with multiple abscesses   Patient Measurements: Height: 6' (182.9 cm) Weight: 173 lb (78.5 kg) IBW/kg (Calculated) : 77.6 TPN AdjBW (KG): 78.5 Body mass index is 23.46 kg/m. Usual Weight: 95kg (7 mo ago)  Insulin Requirements: 1 unit SSI required/24h  Current Nutrition: NPO  IVF: NS at 20 ml/hr  Central access: PICC placed 1/20 TPN start date: 1/20  ASSESSMENT                                                                                                     HPI:  61 YOM with history of Crohn's disease presents 1/19 to [facility] ED with c/o abd pain.  CT reveals active Crohn's and RLQ abscesses and fluid collection. These are not amendable to IR drainage.  GI has stated it was reasonable to start TPN since will be NPO and will have follow- scans week of 1/22 to determine if will need surgical intervention  Significant events:  1/23: CTscan abd/pelvis = severe wall thickening of terminal ileum w/ small amount loculated gas c/w microperforation and enlargement of transverse abdominis and psoas muscle abscess 1/24: plan for IR drain placement for RLQ abscess  Today:    Glucose - no PMH of DM. CBGs controlled with minimal SSI use  Electrolytes - Na sl low to low end normal, corr Ca WNL  Renal -WNL  LFTs - ALT low  TGs - WNL 98 (1/21)  Prealbumin - 13.6 (1/21)  NUTRITIONAL GOALS                                                                                             RD recs: 2125 - 2480 kcals, 95-110 gm protein Clinimix 5/20 at a goal rate of 20m/hr + 20% fat emulsion at 248mhr over 12h to provide: 96g/day protein, 2169Kcal/day.  All Clinimix products are currently on backorder.  Pharmacy will use Clinimix product based on what we have available in stock that will most closely match patient's needs.  Formulation changes will be communicated in  progress notes.  PLAN  At 1800 today:  Continue Clinimix E 5/20 to 51m/hr (goal)  20% fat emulsion at 216mhr over 12h   TPN to contain standard multivitamins daily and trace elements every other day.  This is due to national backorder of trace elements   Continue IVF to 2080mr.  Decrease sensitive SSI to q8h as he is requiring minimal coverage with TPN at goal.   D/C coverage for CBG 121-150  TPN lab panels on Mondays & Thursdays  F/u daily.  DusDoreene ElandharmD, BCPS.   Pager: [phone] 8:25 AM

## 2016-03-22 NOTE — Progress Notes (Signed)
Chief Complaint: Patient was seen today for follow up RLQ abscess drain  Referring Physician(s): Dr. Kaylyn Lim  Supervising Physician: Jacqulynn Cadet  Patient Status: [facility] - In-pt  Subjective: S/p RLQ perc drain for Crohn's related abscess Feeling better today Sore at drain site as expected. Has been started on clears. Passing flatus, no BM yet  Objective: Physical Exam: BP 97/61 (BP Location: Left Arm)   Pulse 83   Temp 98 F (36.7 C) (Oral)   Resp 16   Ht 6' (1.829 m)   Wt 173 lb (78.5 kg)   SpO2 97%   BMI 23.46 kg/m  RLQ drain intact, site clean, mildly tender Output bloody/purulent   Current Facility-Administered Medications:  .  0.9 %  sodium chloride infusion, , Intravenous, Continuous, Emiliano Dyer, [facility], Last Rate: 20 mL/hr at 03/20/16 1039 .  acetaminophen (TYLENOL) tablet 650 mg, 650 mg, Oral, Q6H PRN, Gardiner Barefoot, NP, 650 mg at 03/20/16 2043 .  albuterol (PROVENTIL) (2.5 MG/3ML) 0.083% nebulizer solution 2.5 mg, 2.5 mg, Nebulization, Q2H PRN, Rondell A Smith, MD .  enoxaparin (LOVENOX) injection 40 mg, 40 mg, Subcutaneous, Q24H, Silver Huguenin Elgergawy, MD, 40 mg at 03/18/16 1329 .  TPN (CLINIMIX-E) Adult, 80 mL/hr, Intravenous, Continuous TPN **AND** fat emulsion 20 % infusion 240 mL, 240 mL, Intravenous, Continuous TPN, Berton Mount, RPH .  insulin aspart (novoLOG) injection 0-9 Units, 0-9 Units, Subcutaneous, Q8H, Berton Mount, RPH .  iopamidol (ISOVUE-300) 61 % injection 100 mL, 100 mL, Intravenous, Once PRN, Albertine Patricia, MD .  iopamidol (ISOVUE-300) 61 % injection 15 mL, 15 mL, Oral, Once PRN, Albertine Patricia, MD .  morphine 2 MG/ML injection 2-4 mg, 2-4 mg, Intravenous, Q2H PRN, Albertine Patricia, MD, 4 mg at 03/21/16 2335 .  ondansetron (ZOFRAN) tablet 4 mg, 4 mg, Oral, Q6H PRN **OR** ondansetron (ZOFRAN) injection 4 mg, 4 mg, Intravenous, Q6H PRN, Norval Morton, MD .  pantoprazole (PROTONIX) EC tablet 40 mg, 40 mg,  Oral, Daily, Albertine Patricia, MD, 40 mg at 03/22/16 1021 .  piperacillin-tazobactam (ZOSYN) IVPB 3.375 g, 3.375 g, Intravenous, Q8H, Rondell A Smith, MD, 3.375 g at 03/22/16 1327 .  saccharomyces boulardii (FLORASTOR) capsule 250 mg, 250 mg, Oral, BID, Albertine Patricia, MD, 250 mg at 03/22/16 1021 .  sodium chloride flush (NS) 0.9 % injection 10-40 mL, 10-40 mL, Intracatheter, Q12H, Silver Huguenin Elgergawy, MD, 10 mL at 03/21/16 2200 .  sodium chloride flush (NS) 0.9 % injection 10-40 mL, 10-40 mL, Intracatheter, PRN, Albertine Patricia, MD .  TPN (CLINIMIX-E) Adult, , Intravenous, Continuous TPN, Last Rate: 80 mL/hr at 03/21/16 1717 **AND** [EXPIRED] fat emulsion 20 % infusion 240 mL, 240 mL, Intravenous, Continuous TPN, Berton Mount, RPH, Last Rate: 20 mL/hr at 03/21/16 1717, 240 mL at 03/21/16 1717  Labs: CBC  Recent Labs  03/21/16 0542 03/21/16 0700  WBC QUESTIONABLE RESULTS, RECOMMEND RECOLLECT TO VERIFY 12.0*  HGB QUESTIONABLE RESULTS, RECOMMEND RECOLLECT TO VERIFY 9.2*  HCT QUESTIONABLE RESULTS, RECOMMEND RECOLLECT TO VERIFY 29.0*  PLT QUESTIONABLE RESULTS, RECOMMEND RECOLLECT TO VERIFY 382   BMET  Recent Labs  03/21/16 0700 03/22/16 0822  NA 134* 135  K 3.7 4.2  CL 104 103  CO2 23 26  GLUCOSE 125* 141*  BUN 12 12  CREATININE 0.77 0.56*  CALCIUM 8.2* 8.6*   LFT  Recent Labs  03/22/16 0822  PROT 7.8  ALBUMIN 3.0*  AST 16  ALT 11*  ALKPHOS  35*  BILITOT 0.4   PT/INR  Recent Labs  03/21/16 0542 03/21/16 0700  LABPROT QUESTIONABLE RESULTS, RECOMMEND RECOLLECT TO VERIFY 16.5*  INR NOT CALCULATED 1.33     Studies/Results: Ct Image Guided Drainage By Percutaneous Catheter  Result Date: 03/21/2016 INDICATION: Right lower quadrant abscess EXAM: CT GUIDED DRAINAGE OF A RIGHT LOWER QUADRANT ABSCESS MEDICATIONS: The patient is currently admitted to the hospital and receiving intravenous antibiotics. The antibiotics were administered within an appropriate time  frame prior to the initiation of the procedure. ANESTHESIA/SEDATION: mg IV Versed  mcg IV Fentanyl Moderate Sedation Time: The patient was continuously monitored during the procedure by the interventional radiology nurse under my direct supervision. COMPLICATIONS: None immediate. TECHNIQUE: Informed written consent was obtained from the patient after a thorough discussion of the procedural risks, benefits and alternatives. All questions were addressed. Maximal Sterile Barrier Technique was utilized including caps, mask, sterile gowns, sterile gloves, sterile drape, hand hygiene and skin antiseptic. A timeout was performed prior to the initiation of the procedure. PROCEDURE: The right lower quadrant was prepped with ChloraPrep in a sterile fashion, and a sterile drape was applied covering the operative field. A sterile gown and sterile gloves were used for the procedure. Local anesthesia was provided with 1% Lidocaine. Under CT guidance, an 18 gauge needle was inserted into the right lower quadrant abscess. It was removed over an Amplatz wire. Twelve [place] dilator followed by a 12 [place] drain were inserted. The drain was looped and string fixed in the fluid collection. 30 cc frank pus was aspirated. It was sewn to the skin. FINDINGS: Imaging documents placement of a 40 French right lower quadrant abscess drain. IMPRESSION: Successful CT-guided right lower quadrant abscess drain. Electronically Signed   By: Marybelle Killings M.D.   On: 03/21/2016 17:09    Assessment/Plan: RLQ abscess s/p perc drain Await Cxs Drain care/flushes as ordered. IR following.    LOS: 6 days   I spent a total of 15 minutes in face to face in clinical consultation, greater than 50% of which was counseling/coordinating care for RLQ abscess drain  Ascencion Dike PA-C 03/22/2016 2:55 PM

## 2016-03-22 NOTE — Progress Notes (Addendum)
PROGRESS NOTE                                                                                                                                                                                                             Patient Demographics:    Larry [name], is a 36 y.o. male, DOB - 1980/09/29, HWT:[id number]  Admit date - 03/16/2016   Admitting Physician Norval Morton, MD  Outpatient Primary MD for the patient is Pcp Not In System  LOS - 6   Chief Complaint  Patient presents with  . Abdominal Pain       Brief Narrative   36 year old male with PMH of Crohn's colitis on azathioprine, additionally diagnosed in 2015 when he had presented with abscesses that required drainage. He followed up with Dr. Britta Mccreedy in [place] who apparently did colonoscopy and was reportedly normal. He has been on Imuran 100 MG daily since that time along with prednisone (since August when placed on prednisone for a flare by Dr. Britta Mccreedy). Prednisone was tapered to 5 mg daily prior to this admission. Dr. Britta Mccreedy has relocated and patient has not seen him back since August. He now presented with right lower quadrant abdominal pain, hard stool every 3 days or so without rectal bleeding or diarrhea, reduced appetite and weight loss. Initial CT abdomen showed active Crohn's colitis with abscess collections in the right lower quadrant involving iliopsoas muscle.    Subjective:    Larry [name] is status post RLQ percutaneous drain by IR on 1/24. States that it drained "white colored material" initially and now bloody and the amount has significantly decreased and so as his pain. In fact he states that he does not have any pain this morning. Having BM.   Assessment  & Plan :    Principal Problem:   Crohn's colitis, with abscess ([place]) Active Problems:   Crohn's colitis ([place])   Leukocytosis   Anemia   Abnormal CT of the abdomen   Crohn's disease of ileum with abscess ([facility])   Crohn's  Terminal ileitis, with abscess - Patient with known history of Crohn's disease, CT abdomen pelvis significant for Crohn's colitis with abscess. - GI was consulted. Imuran was discontinued. As per GI, he is going to need biological therapy when these acute abscesses resolve. Hepatitis B surface antibody: 4.1 (inconsistent with immunity), QuantiFERON TB  gold 03/16/16: Indeterminate. Hepatitis B surface antigen: Negative. As discussed with GI, may need hepatitis immunization and repeating QuantiFERON TB gold or PPD prior to biological. - As per IR, initially his abscesses were not amenable to percutaneous drainage. He is being treated with bowel rest/nothing by mouth, IV Zosyn, TPN. - GI input greatly appreciated, continue nothing by mouth, bowel rest, and IV Zosyn (since 03/15/16), anticipated prolonged course of bowel rest,PICC line inserted 1/20, and started on TPN. Surgeons were consulted and followed along. He indicated that if no resolution with conservative measures, may require surgical intervention for drainage and resection of terminal ileum and cecum. - Reimaged with CT abdomen on 03/20/16 which showed abscesses had enlarged. IR was re consulted for percutaneous drainage. - IR placed CT-guided drain on 03/21/16. Abscess culture pending but Gram stain was negative. As per GI follow-up, this was the second time that the patient has had drainage of Crohn's related abscess and when infection is optimally controlled, they think that the next step is ileal resection followed by outpatient GI management of his Crohn's after recovering from surgery. Patient had questions pertaining to surgery and was advised to discuss with GI this morning.  Anemia - Reasonably stable since admission and has been in the low 9 g per DL range for the last several days. Likely anemia of chronic disease.   Thrombocytosis - likely reactive . Resolved.     Code Status : Full  Family Communication  : None at  bedside  Disposition Plan  : home when stable  Consults  :  GI, Gen surgery, IR  Procedures  : CT-guided drain catheter placement into abscess by IR on 1/24., R upper arm PICC line.  DVT Prophylaxis  :  Lovenox -SCDs   Lab Results  Component Value Date   PLT 382 03/21/2016    Antibiotics  :    Anti-infectives    Start     Dose/Rate Route Frequency Ordered Stop   03/16/16 1400  piperacillin-tazobactam (ZOSYN) IVPB 3.375 g     3.375 g 12.5 mL/hr over 240 Minutes Intravenous Every 8 hours 03/16/16 0548     03/16/16 0415  piperacillin-tazobactam (ZOSYN) IVPB 3.375 g     3.375 g 12.5 mL/hr over 240 Minutes Intravenous  Once 03/16/16 0411 03/16/16 0844        Objective:   Vitals:   03/21/16 1642 03/21/16 1645 03/21/16 2047 03/22/16 0526  BP: 106/76 99/60 111/67 97/61  Pulse: (!) 113 (!) 105 96 83  Resp: 17 (!) 29 20 16   Temp:   99 F (37.2 C) 98 F (36.7 C)  TempSrc:   Oral Oral  SpO2: 99% 99% 98% 97%  Weight:      Height:        Wt Readings from Last 3 Encounters:  03/16/16 78.5 kg (173 lb)     Intake/Output Summary (Last 24 hours) at 03/22/16 1113 Last data filed at 03/22/16 0720  Gross per 24 hour  Intake               60 ml  Output             1670 ml  Net            -1610 ml     Physical Exam  Gen. exam: Awake Alert, Oriented X 3,  Neck: Supple Neck,No JVD,  Respiratory system: Symmetrical Chest wall movement, Good air movement bilaterally, CTAB CVS: RRR,No Gallops,Rubs or new Murmurs, No Parasternal Heave  GI:  Nondistended and soft. RLQ drain with small amount of bloody drainage (70 mL drainage recorded since placement). No tenderness or peritoneal signs. Normal bowel sounds heard. Extremities: No Cyanosis, Clubbing or edema, No new Rash or bruise   CNS: Alert and oriented. No focal deficits.    Data Review:    CBC  Recent Labs Lab 03/16/16 0101  03/18/16 0800 03/19/16 0530 03/20/16 0534 03/21/16 0542 03/21/16 0700  WBC 12.3*  < >  12.2* 10.8* 12.7* QUESTIONABLE RESULTS, RECOMMEND RECOLLECT TO VERIFY 12.0*  HGB 10.6*  < > 8.8* 9.2* 9.2* QUESTIONABLE RESULTS, RECOMMEND RECOLLECT TO VERIFY 9.2*  HCT 33.0*  < > 28.2* 29.4* 29.1* QUESTIONABLE RESULTS, RECOMMEND RECOLLECT TO VERIFY 29.0*  PLT 483*  < > 411* 411* 368 QUESTIONABLE RESULTS, RECOMMEND RECOLLECT TO VERIFY 382  MCV 81.5  < > 80.6 80.1 81.3 QUESTIONABLE RESULTS, RECOMMEND RECOLLECT TO VERIFY 79.2  MCH 26.2  < > 25.1* 25.1* 25.7* QUESTIONABLE RESULTS, RECOMMEND RECOLLECT TO VERIFY 25.1*  MCHC 32.1  < > 31.2 31.3 31.6 QUESTIONABLE RESULTS, RECOMMEND RECOLLECT TO VERIFY 31.7  RDW 14.1  < > 14.3 14.0 14.1 QUESTIONABLE RESULTS, RECOMMEND RECOLLECT TO VERIFY 13.7  LYMPHSABS 1.6  --  1.2  --   --   --   --   MONOABS 1.2*  --  1.0  --   --   --   --   EOSABS 0.1  --  0.0  --   --   --   --   BASOSABS 0.0  --  0.0  --   --   --   --   < > = values in this interval not displayed.  Chemistries   Recent Labs Lab 03/16/16 0101 03/17/16 0350  03/18/16 0800 03/19/16 0530 03/20/16 0534 03/21/16 0700 03/22/16 0822  NA 135  --   < > 135 138 138 134* 135  K 3.7  --   < > 3.5 3.6 3.7 3.7 4.2  CL 102  --   < > 102 106 107 104 103  CO2 25  --   < > 26 27 24 23 26   GLUCOSE 96  --   < > 99 107* 124* 125* 141*  BUN 12  --   < > 12 11 12 12 12   CREATININE 0.78  --   < > 0.77 0.74 0.71 0.77 0.56*  CALCIUM 8.8*  --   < > 8.3* 8.5* 8.4* 8.2* 8.6*  MG  --  1.9  --  1.8 2.0 2.0  --  2.1  AST 13*  --   --  11* 12*  --   --  16  ALT 12*  --   --  9* 8*  --   --  11*  ALKPHOS 39  --   --  31* 30*  --   --  35*  BILITOT 0.4  --   --  0.6 0.4  --   --  0.4  < > = values in this interval not displayed. ------------------------------------------------------------------------------------------------------------------ No results for input(s): CHOL, HDL, LDLCALC, TRIG, CHOLHDL, LDLDIRECT in the last 72 hours.  No results found for:  HGBA1C ------------------------------------------------------------------------------------------------------------------ No results for input(s): TSH, T4TOTAL, T3FREE, THYROIDAB in the last 72 hours.  Invalid input(s): FREET3 ------------------------------------------------------------------------------------------------------------------ No results for input(s): VITAMINB12, FOLATE, FERRITIN, TIBC, IRON, RETICCTPCT in the last 72 hours.  Coagulation profile  Recent Labs Lab 03/21/16 0542 03/21/16 0700  INR NOT CALCULATED 1.33    No results for input(s): DDIMER  in the last 72 hours.  Cardiac Enzymes No results for input(s): CKMB, TROPONINI, MYOGLOBIN in the last 168 hours.  Invalid input(s): CK ------------------------------------------------------------------------------------------------------------------ No results found for: BNP  Inpatient Medications  Scheduled Meds: . enoxaparin (LOVENOX) injection  40 mg Subcutaneous Q24H  . insulin aspart  0-9 Units Subcutaneous Q8H  . pantoprazole  40 mg Oral Daily  . piperacillin-tazobactam (ZOSYN)  IV  3.375 g Intravenous Q8H  . saccharomyces boulardii  250 mg Oral BID  . sodium chloride flush  10-40 mL Intracatheter Q12H   Continuous Infusions: . sodium chloride 20 mL/hr at 03/20/16 1039  . Marland KitchenTPN (CLINIMIX-E) Adult     And  . fat emulsion    . Marland KitchenTPN (CLINIMIX-E) Adult 80 mL/hr at 03/21/16 1717   PRN Meds:.acetaminophen, albuterol, iopamidol, iopamidol, morphine injection, ondansetron **OR** ondansetron (ZOFRAN) IV, sodium chloride flush  Micro Results Recent Results (from the past 240 hour(s))  Aerobic/Anaerobic Culture (surgical/deep wound)     Status: None (Preliminary result)   Collection Time: 03/21/16  5:08 PM  Result Value Ref Range Status   Specimen Description ABSCESS ABDOMEN  Final   Special Requests NONE  Final   Gram Stain   Final    ABUNDANT WBC PRESENT, PREDOMINANTLY PMN NO ORGANISMS SEEN Performed at  [facility], [address], [place], [place] [address]    Culture PENDING  Incomplete   Report Status PENDING  Incomplete    Radiology Reports Ct Abdomen Pelvis W Contrast  Result Date: 03/20/2016 CLINICAL DATA:  Right lower quadrant abdominal pain. History of Crohn' s disease. EXAM: CT ABDOMEN AND PELVIS WITH CONTRAST TECHNIQUE: Multidetector CT imaging of the abdomen and pelvis was performed using the standard protocol following bolus administration of intravenous contrast. CONTRAST:  15m ISOVUE-300 IOPAMIDOL (ISOVUE-300) INJECTION 61% COMPARISON:  03/16/2016 CT scan FINDINGS: Lower chest: Linear subsegmental atelectasis in the posterior basal segment right lower lobe. Density in the cavoatrial junction is probably a central line, correlate with patient history. Hepatobiliary: Unremarkable Pancreas: Unremarkable Spleen: Unremarkable Adrenals/Urinary Tract: Unremarkable Stomach/Bowel: Similar to prior there is a 14 cm region of severe wall thickening in the terminal ileum with abscess tracking along the right transverse abdominis muscle in the right retroperitoneum to involve the right psoas muscle, volume of abscess approximately 80 cc and containing gas and fluid with thick enhancing margins, this is increased in size compared to the prior exam. On images 56-57 of series 2 there small locules of extraluminal gas medial to the terminal ileum compatible with contained perforation or small microabscesses. Air-fluid level in the contrast in the rectum. Vascular/Lymphatic: Small reactive right inguinal and pelvic lymph nodes. Reproductive: Unremarkable Other: No supplemental non-categorized findings. Musculoskeletal: Sacroiliac joints unremarkable. Partially calcified annulus fibrosis along a disc bulge at L5-S1. IMPRESSION: 1. Severe wall thickening in the distal 14 cm of the terminal ileum, with small amounts of adjacent extraluminal loculated gas containing compatible with micro perforation, and  with enlargement of the right transverse abdominis and psoas muscle abscess which is approximately 80 cc in volume. Electronically Signed   By: WVan ClinesM.D.   On: 03/20/2016 12:12   Ct Abdomen Pelvis W Contrast  Result Date: 03/16/2016 CLINICAL DATA:  Abdominal pain. Right lower quadrant pain. Weight loss. Crohn's disease. EXAM: CT ABDOMEN AND PELVIS WITH CONTRAST TECHNIQUE: Multidetector CT imaging of the abdomen and pelvis was performed using the standard protocol following bolus administration of intravenous contrast. CONTRAST:  1047mISOVUE-300 IOPAMIDOL (ISOVUE-300) INJECTION 61% COMPARISON:  CT abdomen/ pelvis  10/28/2015 at [facility]. FINDINGS: Lower chest: The lung bases are clear. Hepatobiliary: No focal liver abnormality is seen. No gallstones, gallbladder wall thickening, or biliary dilatation. Pancreas: No ductal dilatation or inflammation. Spleen: Normal in size without focal abnormality. Adrenals/Urinary Tract: Normal adrenal glands. Symmetric renal enhancement without hydronephrosis. Urinary bladder is physiologically distended. Stomach/Bowel: Wall thickening involving the terminal ileum and cecum with moderate adjacent mesenteric inflammation consistent with active Crohn's. There multiple adjacent irregular fluid collections with peripheral enhancement in the right lower quadrant consistent with abscess. These involve both the adjacent fat and right iliopsoas muscle. A discrete collection anteriorly measures 2.8 x 1.7 cm. This may be contiguous with the more posterior collection measuring 2.5 x 4.7 cm, which involves the iliopsoas muscle. The appendix is tentatively identified containing intraluminal fluid with similar stranding to the adjacent inflamed cecum and ascending colon. No other bowel wall thickening to suggest additional sites of active Crohn's. Moderate stool in the more distal colon. Vascular/Lymphatic: No significant vascular findings are present. No  enlarged abdominal or pelvic lymph nodes. Reproductive: Prostate is unremarkable. Other: Right lower quadrant abscesses as described. No pelvic free fluid. No free air. Musculoskeletal: Heterogeneous edema of the right iliopsoas muscle with small intramuscular abscess as described. Mild heterogeneous enlargement of the distal iliopsoas likely myositis. There are no acute or suspicious osseous abnormalities. IMPRESSION: Active Crohn's involving the terminal ileum and cecum. There are adjacent irregular abscesses in the right lower quadrant involving the adjacent fat and iliopsoas muscle. Fluid collections are irregular in shape, and measure approximately 2.8 and 4.7 cm respectively. Electronically Signed   By: Jeb Levering M.D.   On: 03/16/2016 03:24   Ct Image Guided Drainage By Percutaneous Catheter  Result Date: 03/21/2016 INDICATION: Right lower quadrant abscess EXAM: CT GUIDED DRAINAGE OF A RIGHT LOWER QUADRANT ABSCESS MEDICATIONS: The patient is currently admitted to the hospital and receiving intravenous antibiotics. The antibiotics were administered within an appropriate time frame prior to the initiation of the procedure. ANESTHESIA/SEDATION: mg IV Versed  mcg IV Fentanyl Moderate Sedation Time: The patient was continuously monitored during the procedure by the interventional radiology nurse under my direct supervision. COMPLICATIONS: None immediate. TECHNIQUE: Informed written consent was obtained from the patient after a thorough discussion of the procedural risks, benefits and alternatives. All questions were addressed. Maximal Sterile Barrier Technique was utilized including caps, mask, sterile gowns, sterile gloves, sterile drape, hand hygiene and skin antiseptic. A timeout was performed prior to the initiation of the procedure. PROCEDURE: The right lower quadrant was prepped with ChloraPrep in a sterile fashion, and a sterile drape was applied covering the operative field. A sterile gown and  sterile gloves were used for the procedure. Local anesthesia was provided with 1% Lidocaine. Under CT guidance, an 18 gauge needle was inserted into the right lower quadrant abscess. It was removed over an Amplatz wire. Twelve [place] dilator followed by a 12 [place] drain were inserted. The drain was looped and string fixed in the fluid collection. 30 cc frank pus was aspirated. It was sewn to the skin. FINDINGS: Imaging documents placement of a 67 French right lower quadrant abscess drain. IMPRESSION: Successful CT-guided right lower quadrant abscess drain. Electronically Signed   By: Marybelle Killings M.D.   On: 03/21/2016 17:09     Lovell Nuttall, MD, FACP, FHM. [facility] Pager [phone]  If 7PM-7AM, please contact night-coverage [email or web address] Password [email or web address] 03/22/2016, 11:13 AM

## 2016-03-23 DIAGNOSIS — R633 Feeding difficulties, unspecified: Secondary | ICD-10-CM

## 2016-03-23 LAB — GLUCOSE, CAPILLARY
GLUCOSE-CAPILLARY: 141 mg/dL — AB (ref 65–99)
GLUCOSE-CAPILLARY: 129 mg/dL — AB (ref 65–99)
Glucose-Capillary: 114 mg/dL — ABNORMAL HIGH (ref 65–99)
Glucose-Capillary: 111 mg/dL — ABNORMAL HIGH (ref 65–99)

## 2016-03-23 MED ORDER — FAT EMULSION 20 % IV EMUL
240.0000 mL | INTRAVENOUS | Status: AC
  Administered 2016-03-23: 240 mL via INTRAVENOUS
  Filled 2016-03-23: qty 250

## 2016-03-23 MED ORDER — TRACE MINERALS CR-CU-MN-SE-ZN 10-1000-500-60 MCG/ML IV SOLN
INTRAVENOUS | Status: AC
  Administered 2016-03-23: 17:00:00 via INTRAVENOUS
  Filled 2016-03-23: qty 1920

## 2016-03-23 NOTE — Progress Notes (Signed)
Patient ID: Larry [name], male   DOB: 1980-08-26, 36 y.o.   MRN: [id number]    Referring Physician(s): Dr. Kaylyn Lim   Supervising Physician: Aletta Edouard  Patient Status:  [facility] - In-pt  Chief Complaint: Patient was seen today for follow up RLQ abscess drain placed on 03/21/16  Subjective: Currently walking laps on the floor. States he feels better. Denies any pain/pressure, nausea, or vomiting.    Allergies: Patient has no known allergies.  Medications: Prior to Admission medications   Medication Sig Start Date End Date Taking? Authorizing Provider  acetaminophen (TYLENOL) 500 MG tablet Take 1,000 mg by mouth every 6 (six) hours as needed for moderate pain.   Yes Historical Provider, MD  azaTHIOprine (IMURAN) 50 MG tablet Take 2 tablets by mouth daily. 02/18/16  Yes Historical Provider, MD  predniSONE (DELTASONE) 10 MG tablet Take 5 mg by mouth daily.  03/02/16  Yes Historical Provider, MD     Vital Signs: BP (!) 116/56 (BP Location: Right Arm)   Pulse (!) 103   Temp 97.4 F (36.3 C) (Oral)   Resp 16   Ht 6' (1.829 m)   Wt 173 lb (78.5 kg)   SpO2 98%   BMI 23.46 kg/m   Physical Exam Awake and alert. Abdomen is soft, nontender, nondistended. Bowel sounds are intact. RLQ drain is intact with 5 cc of serosanguinous fluid. Tube site is clean, dry, and without erythema.   Imaging: Ct Abdomen Pelvis W Contrast  Result Date: 03/20/2016 CLINICAL DATA:  Right lower quadrant abdominal pain. History of Crohn' s disease. EXAM: CT ABDOMEN AND PELVIS WITH CONTRAST TECHNIQUE: Multidetector CT imaging of the abdomen and pelvis was performed using the standard protocol following bolus administration of intravenous contrast. CONTRAST:  28m ISOVUE-300 IOPAMIDOL (ISOVUE-300) INJECTION 61% COMPARISON:  03/16/2016 CT scan FINDINGS: Lower chest: Linear subsegmental atelectasis in the posterior basal segment right lower lobe. Density in the cavoatrial junction is probably a central line,  correlate with patient history. Hepatobiliary: Unremarkable Pancreas: Unremarkable Spleen: Unremarkable Adrenals/Urinary Tract: Unremarkable Stomach/Bowel: Similar to prior there is a 14 cm region of severe wall thickening in the terminal ileum with abscess tracking along the right transverse abdominis muscle in the right retroperitoneum to involve the right psoas muscle, volume of abscess approximately 80 cc and containing gas and fluid with thick enhancing margins, this is increased in size compared to the prior exam. On images 56-57 of series 2 there small locules of extraluminal gas medial to the terminal ileum compatible with contained perforation or small microabscesses. Air-fluid level in the contrast in the rectum. Vascular/Lymphatic: Small reactive right inguinal and pelvic lymph nodes. Reproductive: Unremarkable Other: No supplemental non-categorized findings. Musculoskeletal: Sacroiliac joints unremarkable. Partially calcified annulus fibrosis along a disc bulge at L5-S1. IMPRESSION: 1. Severe wall thickening in the distal 14 cm of the terminal ileum, with small amounts of adjacent extraluminal loculated gas containing compatible with micro perforation, and with enlargement of the right transverse abdominis and psoas muscle abscess which is approximately 80 cc in volume. Electronically Signed   By: WVan ClinesM.D.   On: 03/20/2016 12:12   Ct Image Guided Drainage By Percutaneous Catheter  Result Date: 03/21/2016 INDICATION: Right lower quadrant abscess EXAM: CT GUIDED DRAINAGE OF A RIGHT LOWER QUADRANT ABSCESS MEDICATIONS: The patient is currently admitted to the hospital and receiving intravenous antibiotics. The antibiotics were administered within an appropriate time frame prior to the initiation of the procedure. ANESTHESIA/SEDATION: mg IV Versed  mcg IV Fentanyl Moderate Sedation  Time: The patient was continuously monitored during the procedure by the interventional radiology nurse under  my direct supervision. COMPLICATIONS: None immediate. TECHNIQUE: Informed written consent was obtained from the patient after a thorough discussion of the procedural risks, benefits and alternatives. All questions were addressed. Maximal Sterile Barrier Technique was utilized including caps, mask, sterile gowns, sterile gloves, sterile drape, hand hygiene and skin antiseptic. A timeout was performed prior to the initiation of the procedure. PROCEDURE: The right lower quadrant was prepped with ChloraPrep in a sterile fashion, and a sterile drape was applied covering the operative field. A sterile gown and sterile gloves were used for the procedure. Local anesthesia was provided with 1% Lidocaine. Under CT guidance, an 18 gauge needle was inserted into the right lower quadrant abscess. It was removed over an Amplatz wire. Twelve [place] dilator followed by a 12 [place] drain were inserted. The drain was looped and string fixed in the fluid collection. 30 cc frank pus was aspirated. It was sewn to the skin. FINDINGS: Imaging documents placement of a 89 French right lower quadrant abscess drain. IMPRESSION: Successful CT-guided right lower quadrant abscess drain. Electronically Signed   By: Marybelle Killings M.D.   On: 03/21/2016 17:09    Labs:  CBC:  Recent Labs  03/19/16 0530 03/20/16 0534 03/21/16 0542 03/21/16 0700  WBC 10.8* 12.7* QUESTIONABLE RESULTS, RECOMMEND RECOLLECT TO VERIFY 12.0*  HGB 9.2* 9.2* QUESTIONABLE RESULTS, RECOMMEND RECOLLECT TO VERIFY 9.2*  HCT 29.4* 29.1* QUESTIONABLE RESULTS, RECOMMEND RECOLLECT TO VERIFY 29.0*  PLT 411* 368 QUESTIONABLE RESULTS, RECOMMEND RECOLLECT TO VERIFY 382    COAGS:  Recent Labs  03/21/16 0542 03/21/16 0700  INR NOT CALCULATED 1.33    BMP:  Recent Labs  03/19/16 0530 03/20/16 0534 03/21/16 0700 03/22/16 0822  NA 138 138 134* 135  K 3.6 3.7 3.7 4.2  CL 106 107 104 103  CO2 27 24 23 26   GLUCOSE 107* 124* 125* 141*  BUN 11 12 12 12     CALCIUM 8.5* 8.4* 8.2* 8.6*  CREATININE 0.74 0.71 0.77 0.56*  GFRNONAA >60 >60 >60 >60  GFRAA >60 >60 >60 >60    LIVER FUNCTION TESTS:  Recent Labs  03/16/16 0101 03/18/16 0800 03/19/16 0530 03/22/16 0822  BILITOT 0.4 0.6 0.4 0.4  AST 13* 11* 12* 16  ALT 12* 9* 8* 11*  ALKPHOS 39 31* 30* 35*  PROT 7.8 6.6 7.0 7.8  ALBUMIN 3.3* 2.7* 2.9* 3.0*    Assessment and Plan: Hx Chron's colitis; RLQ abdominal abscesses s/p RLQ abdominal drain placement on 03/21/16. Drain is intact draining 60 ml yesterday. Patient is improving, WBC 12 (12.7), Hgb stable, and AF. Cultures pending. Will continue to follow. Continue drain care.Plan to recheck f/u CT next week  Electronically Signed: D. Lennette Bihari Keeon Zurn/Jason Whitaker,PA-S 03/23/2016, 2:48 PM   I spent a total of 15 Minutes at the the patient's bedside AND on the patient's hospital floor or unit, greater than 50% of which was counseling/coordinating care for RLQ abdominal drain.

## 2016-03-23 NOTE — Progress Notes (Signed)
PHARMACY - ADULT TOTAL PARENTERAL NUTRITION CONSULT NOTE   Pharmacy Consult for TPN Indication: Crohn's disease on bowel rest with multiple abscesses   Patient Measurements: Height: 6' (182.9 cm) Weight: 173 lb (78.5 kg) IBW/kg (Calculated) : 77.6 TPN AdjBW (KG): 78.5 Body mass index is 23.46 kg/m. Usual Weight: 95kg (7 mo ago)  Insulin Requirements: no SSI required/24h  Current Nutrition: clear liquids  IVF: NS at 20 ml/hr  Central access: PICC placed 1/20 TPN start date: 1/20  ASSESSMENT                                                                                                     HPI:  [id number] with history of Crohn's disease presents 1/19 to [facility] ED with c/o abd pain.  CT reveals active Crohn's and RLQ abscesses and fluid collection. These are not amendable to IR drainage.  GI has stated it was reasonable to start TPN since will be NPO and will have follow- scans week of 1/22 to determine if will need surgical intervention  Significant events:  1/23: CTscan abd/pelvis = severe wall thickening of terminal ileum w/ small amount loculated gas c/w microperforation and enlargement of transverse abdominis and psoas muscle abscess 1/24: IR drain placement for RLQ abscess  Today:    Glucose - no PMH of DM. CBGs controlled with minimal SSI use  Electrolytes - per 1/26 labs,  Na sl low to low end normal, corr Ca WNL  Renal -WNL  LFTs - ALT low  TGs - WNL 98 (1/21)  Prealbumin - 13.6 (1/21)  NUTRITIONAL GOALS                                                                                             RD recs: 2125 - 2480 kcals, 95-110 gm protein Clinimix 5/20 at a goal rate of 86m/hr + 20% fat emulsion at 264mhr over 12h to provide: 96g/day protein, 2169Kcal/day.  All Clinimix products are currently on backorder.  Pharmacy will use Clinimix product based on what we have available in stock that will most closely match patient's needs.  Formulation changes will be  communicated in progress notes.  PLAN  At 1800 today:  Tolerating TPN at goal, Continue Clinimix E 5/20 to 54m/hr (goal)  20% fat emulsion at 256mhr over 12h   TPN to contain standard multivitamins daily and trace elements every other day.  This is due to national backorder of trace elements   Continue IVF at 2070mr.  sensitive SSI to q8h as he is requiring minimal coverage with TPN at goal.  D/C coverage for CBG 121-150  TPN lab panels on Mondays & Thursdays  F/u daily.  DusDoreene ElandharmD, BCPS.   Pager: [phone] 7:28 AM

## 2016-03-23 NOTE — Progress Notes (Signed)
PROGRESS NOTE                                                                                                                                                                                                             Patient Demographics:    Larry [name], is a 36 y.o. male, DOB - 09-18-1980, MOL:[id number]  Admit date - 03/16/2016   Admitting Physician Norval Morton, MD  Outpatient Primary MD for the patient is Pcp Not In System  LOS - 7   Chief Complaint  Patient presents with  . Abdominal Pain       Brief Narrative   36 year old male with PMH of Crohn's colitis on azathioprine, additionally diagnosed in 2015 when he had presented with abscesses that required drainage. He followed up with Dr. Britta Mccreedy in [place] who apparently did colonoscopy and was reportedly normal. He has been on Imuran 100 MG daily since that time along with prednisone (since August when placed on prednisone for a flare by Dr. Britta Mccreedy). Prednisone was tapered to 5 mg daily prior to this admission. Dr. Britta Mccreedy has relocated and patient has not seen him back since August. He now presented with right lower quadrant abdominal pain, hard stool every 3 days or so without rectal bleeding or diarrhea, reduced appetite and weight loss. Initial CT abdomen showed active Crohn's colitis with abscess collections in the right lower quadrant involving iliopsoas muscle.    Subjective:    Ran Tullis is status post RLQ percutaneous drain by IR on 1/24. No abdominal pain except mild soreness at percutaneous drain site. Tolerated clear liquids. Had a small BM. Denies any other complaints.   Assessment  & Plan :    Principal Problem:   Crohn's colitis, with abscess ([place]) Active Problems:   Crohn's colitis ([place])   Leukocytosis   Anemia   Abnormal CT of the abdomen   Crohn's disease of ileum with abscess ([facility])   Crohn's Terminal ileitis, with abscess - Patient with known history of Crohn's  disease, CT abdomen pelvis significant for Crohn's colitis with abscess. - GI was consulted. Imuran was discontinued. As per GI, he is going to need biological therapy when these acute abscesses resolve. Hepatitis B surface antibody: 4.1 (inconsistent with immunity), QuantiFERON TB gold 03/16/16: Indeterminate. Hepatitis B surface antigen: Negative. As discussed with GI, may need hepatitis immunization  and repeating QuantiFERON TB gold or PPD prior to biological. - As per IR, initially his abscesses were not amenable to percutaneous drainage. He is being treated with bowel rest/nothing by mouth, IV Zosyn, TPN. - GI input greatly appreciated, continue nothing by mouth, bowel rest, and IV Zosyn (since 03/15/16), anticipated prolonged course of bowel rest,PICC line inserted 1/20, and started on TPN. Surgeons were consulted and followed along. He indicated that if no resolution with conservative measures, may require surgical intervention for drainage and resection of terminal ileum and cecum. - Reimaged with CT abdomen on 03/20/16 which showed abscesses had enlarged. IR was re consulted for percutaneous drainage. - IR placed CT-guided drain on 03/21/16. Abscess culture pending but Gram stain was negative. As per GI follow-up, this was the second time that the patient has had drainage of Crohn's related abscess and when infection is optimally controlled, they think that the next step is ileal resection followed by outpatient GI management of his Crohn's after recovering from surgery. Surgery most likely will be electively and as outpatient. Tolerated clear liquids and being advanced to full liquids today. Plan is to continue PNA until we are sure that he is tolerating adequate diet. Overall clinically improved. Not much drainage from RLQ percutaneous drain.  Anemia - Reasonably stable since admission and has been in the low 9 g per DL range for the last several days. Likely anemia of chronic disease.     Thrombocytosis - likely reactive . Resolved.     Code Status : Full  Family Communication  : None at bedside  Disposition Plan  : home when stable  Consults  :  GI, Gen surgery, IR  Procedures  : CT-guided drain catheter placement into abscess by IR on 1/24., R upper arm PICC line.  DVT Prophylaxis  :  Lovenox -SCDs   Lab Results  Component Value Date   PLT 382 03/21/2016    Antibiotics  :    Anti-infectives    Start     Dose/Rate Route Frequency Ordered Stop   03/16/16 1400  piperacillin-tazobactam (ZOSYN) IVPB 3.375 g     3.375 g 12.5 mL/hr over 240 Minutes Intravenous Every 8 hours 03/16/16 0548     03/16/16 0415  piperacillin-tazobactam (ZOSYN) IVPB 3.375 g     3.375 g 12.5 mL/hr over 240 Minutes Intravenous  Once 03/16/16 0411 03/16/16 0844        Objective:   Vitals:   03/22/16 0526 03/22/16 1508 03/22/16 2152 03/23/16 0614  BP: 97/61 97/60 98/74  101/60  Pulse: 83 94 81 92  Resp: 16 18 16 16   Temp: 98 F (36.7 C) 97.8 F (36.6 C) 98 F (36.7 C) 98.1 F (36.7 C)  TempSrc: Oral Oral Oral Oral  SpO2: 97% 98% 97% 98%  Weight:      Height:        Wt Readings from Last 3 Encounters:  03/16/16 78.5 kg (173 lb)     Intake/Output Summary (Last 24 hours) at 03/23/16 1120 Last data filed at 03/23/16 0973  Gross per 24 hour  Intake          2819.33 ml  Output              630 ml  Net          2189.33 ml     Physical Exam  Gen. exam: Awake Alert, Oriented X 3,  Neck: Supple Neck,No JVD,  Respiratory system: Symmetrical Chest wall movement, Good air movement bilaterally, CTAB  CVS: RRR,No Gallops,Rubs or new Murmurs, No Parasternal Heave  GI: Nondistended and soft. RLQ drain with small amount of bloody drainage. No tenderness or peritoneal signs. Normal bowel sounds heard. Extremities: No Cyanosis, Clubbing or edema, No new Rash or bruise   CNS: Alert and oriented. No focal deficits.    Data Review:    CBC  Recent Labs Lab 03/18/16 0800  03/19/16 0530 03/20/16 0534 03/21/16 0542 03/21/16 0700  WBC 12.2* 10.8* 12.7* QUESTIONABLE RESULTS, RECOMMEND RECOLLECT TO VERIFY 12.0*  HGB 8.8* 9.2* 9.2* QUESTIONABLE RESULTS, RECOMMEND RECOLLECT TO VERIFY 9.2*  HCT 28.2* 29.4* 29.1* QUESTIONABLE RESULTS, RECOMMEND RECOLLECT TO VERIFY 29.0*  PLT 411* 411* 368 QUESTIONABLE RESULTS, RECOMMEND RECOLLECT TO VERIFY 382  MCV 80.6 80.1 81.3 QUESTIONABLE RESULTS, RECOMMEND RECOLLECT TO VERIFY 79.2  MCH 25.1* 25.1* 25.7* QUESTIONABLE RESULTS, RECOMMEND RECOLLECT TO VERIFY 25.1*  MCHC 31.2 31.3 31.6 QUESTIONABLE RESULTS, RECOMMEND RECOLLECT TO VERIFY 31.7  RDW 14.3 14.0 14.1 QUESTIONABLE RESULTS, RECOMMEND RECOLLECT TO VERIFY 13.7  LYMPHSABS 1.2  --   --   --   --   MONOABS 1.0  --   --   --   --   EOSABS 0.0  --   --   --   --   BASOSABS 0.0  --   --   --   --     Chemistries   Recent Labs Lab 03/17/16 0350  03/18/16 0800 03/19/16 0530 03/20/16 0534 03/21/16 0700 03/22/16 0822  NA  --   < > 135 138 138 134* 135  K  --   < > 3.5 3.6 3.7 3.7 4.2  CL  --   < > 102 106 107 104 103  CO2  --   < > 26 27 24 23 26   GLUCOSE  --   < > 99 107* 124* 125* 141*  BUN  --   < > 12 11 12 12 12   CREATININE  --   < > 0.77 0.74 0.71 0.77 0.56*  CALCIUM  --   < > 8.3* 8.5* 8.4* 8.2* 8.6*  MG 1.9  --  1.8 2.0 2.0  --  2.1  AST  --   --  11* 12*  --   --  16  ALT  --   --  9* 8*  --   --  11*  ALKPHOS  --   --  31* 30*  --   --  35*  BILITOT  --   --  0.6 0.4  --   --  0.4  < > = values in this interval not displayed. ------------------------------------------------------------------------------------------------------------------ No results for input(s): CHOL, HDL, LDLCALC, TRIG, CHOLHDL, LDLDIRECT in the last 72 hours.  No results found for: HGBA1C ------------------------------------------------------------------------------------------------------------------ No results for input(s): TSH, T4TOTAL, T3FREE, THYROIDAB in the last 72  hours.  Invalid input(s): FREET3 ------------------------------------------------------------------------------------------------------------------ No results for input(s): VITAMINB12, FOLATE, FERRITIN, TIBC, IRON, RETICCTPCT in the last 72 hours.  Coagulation profile  Recent Labs Lab 03/21/16 0542 03/21/16 0700  INR NOT CALCULATED 1.33    No results for input(s): DDIMER in the last 72 hours.  Cardiac Enzymes No results for input(s): CKMB, TROPONINI, MYOGLOBIN in the last 168 hours.  Invalid input(s): CK ------------------------------------------------------------------------------------------------------------------ No results found for: BNP  Inpatient Medications  Scheduled Meds: . enoxaparin (LOVENOX) injection  40 mg Subcutaneous Q24H  . insulin aspart  0-9 Units Subcutaneous Q8H  . pantoprazole  40 mg Oral Daily  . piperacillin-tazobactam (ZOSYN)  IV  3.375 g  Intravenous Q8H  . saccharomyces boulardii  250 mg Oral BID  . sodium chloride flush  10-40 mL Intracatheter Q12H   Continuous Infusions: . sodium chloride 20 mL/hr at 03/23/16 0553  . Marland KitchenTPN (CLINIMIX-E) Adult     And  . fat emulsion    . Marland KitchenTPN (CLINIMIX-E) Adult 80 mL/hr (03/23/16 0115)   PRN Meds:.acetaminophen, albuterol, iopamidol, iopamidol, morphine injection, ondansetron **OR** ondansetron (ZOFRAN) IV, sodium chloride flush  Micro Results Recent Results (from the past 240 hour(s))  Aerobic/Anaerobic Culture (surgical/deep wound)     Status: None (Preliminary result)   Collection Time: 03/21/16  5:08 PM  Result Value Ref Range Status   Specimen Description ABSCESS ABDOMEN  Final   Special Requests NONE  Final   Gram Stain   Final    ABUNDANT WBC PRESENT, PREDOMINANTLY PMN NO ORGANISMS SEEN    Culture   Final    CULTURE REINCUBATED FOR BETTER GROWTH Performed at [facility], [address], [place], [place] [address]    Report Status PENDING  Incomplete    Radiology Reports Ct Abdomen  Pelvis W Contrast  Result Date: 03/20/2016 CLINICAL DATA:  Right lower quadrant abdominal pain. History of Crohn' s disease. EXAM: CT ABDOMEN AND PELVIS WITH CONTRAST TECHNIQUE: Multidetector CT imaging of the abdomen and pelvis was performed using the standard protocol following bolus administration of intravenous contrast. CONTRAST:  42m ISOVUE-300 IOPAMIDOL (ISOVUE-300) INJECTION 61% COMPARISON:  03/16/2016 CT scan FINDINGS: Lower chest: Linear subsegmental atelectasis in the posterior basal segment right lower lobe. Density in the cavoatrial junction is probably a central line, correlate with patient history. Hepatobiliary: Unremarkable Pancreas: Unremarkable Spleen: Unremarkable Adrenals/Urinary Tract: Unremarkable Stomach/Bowel: Similar to prior there is a 14 cm region of severe wall thickening in the terminal ileum with abscess tracking along the right transverse abdominis muscle in the right retroperitoneum to involve the right psoas muscle, volume of abscess approximately 80 cc and containing gas and fluid with thick enhancing margins, this is increased in size compared to the prior exam. On images 56-57 of series 2 there small locules of extraluminal gas medial to the terminal ileum compatible with contained perforation or small microabscesses. Air-fluid level in the contrast in the rectum. Vascular/Lymphatic: Small reactive right inguinal and pelvic lymph nodes. Reproductive: Unremarkable Other: No supplemental non-categorized findings. Musculoskeletal: Sacroiliac joints unremarkable. Partially calcified annulus fibrosis along a disc bulge at L5-S1. IMPRESSION: 1. Severe wall thickening in the distal 14 cm of the terminal ileum, with small amounts of adjacent extraluminal loculated gas containing compatible with micro perforation, and with enlargement of the right transverse abdominis and psoas muscle abscess which is approximately 80 cc in volume. Electronically Signed   By: WVan ClinesM.D.    On: 03/20/2016 12:12   Ct Abdomen Pelvis W Contrast  Result Date: 03/16/2016 CLINICAL DATA:  Abdominal pain. Right lower quadrant pain. Weight loss. Crohn's disease. EXAM: CT ABDOMEN AND PELVIS WITH CONTRAST TECHNIQUE: Multidetector CT imaging of the abdomen and pelvis was performed using the standard protocol following bolus administration of intravenous contrast. CONTRAST:  1039mISOVUE-300 IOPAMIDOL (ISOVUE-300) INJECTION 61% [facility]  [facility]: The lung bases are clear. Hepatobiliary: No focal liver abnormality is seen. No gallstones, gallbladder wall thickening, or biliary dilatation. Pancreas: No ductal dilatation or inflammation. Spleen: Normal in size without focal abnormality. Adrenals/Urinary Tract: Normal adrenal glands. Symmetric renal enhancement without hydronephrosis. Urinary bladder is physiologically distended. Stomach/Bowel: Wall thickening involving the terminal ileum  and cecum with moderate adjacent mesenteric inflammation consistent with active Crohn's. There multiple adjacent irregular fluid collections with peripheral enhancement in the right lower quadrant consistent with abscess. These involve both the adjacent fat and right iliopsoas muscle. A discrete collection anteriorly measures 2.8 x 1.7 cm. This may be contiguous with the more posterior collection measuring 2.5 x 4.7 cm, which involves the iliopsoas muscle. The appendix is tentatively identified containing intraluminal fluid with similar stranding to the adjacent inflamed cecum and ascending colon. No other bowel wall thickening to suggest additional sites of active Crohn's. Moderate stool in the more distal colon. Vascular/Lymphatic: No significant vascular findings are present. No enlarged abdominal or pelvic lymph nodes. Reproductive: Prostate is unremarkable. Other: Right lower quadrant abscesses as described. No pelvic free fluid. No free air.  Musculoskeletal: Heterogeneous edema of the right iliopsoas muscle with small intramuscular abscess as described. Mild heterogeneous enlargement of the distal iliopsoas likely myositis. There are no acute or suspicious osseous abnormalities. IMPRESSION: Active Crohn's involving the terminal ileum and cecum. There are adjacent irregular abscesses in the right lower quadrant involving the adjacent fat and iliopsoas muscle. Fluid collections are irregular in shape, and measure approximately 2.8 and 4.7 cm respectively. Electronically Signed   By: Jeb Levering M.D.   On: 03/16/2016 03:24   Ct Image Guided Drainage By Percutaneous Catheter  Result Date: 03/21/2016 INDICATION: Right lower quadrant abscess EXAM: CT GUIDED DRAINAGE OF A RIGHT LOWER QUADRANT ABSCESS MEDICATIONS: The patient is currently admitted to the hospital and receiving intravenous antibiotics. The antibiotics were administered within an appropriate time frame prior to the initiation of the procedure. ANESTHESIA/SEDATION: mg IV Versed  mcg IV Fentanyl Moderate Sedation Time: The patient was continuously monitored during the procedure by the interventional radiology nurse under my direct supervision. COMPLICATIONS: None immediate. TECHNIQUE: Informed written consent was obtained from the patient after a thorough discussion of the procedural risks, benefits and alternatives. All questions were addressed. Maximal Sterile Barrier Technique was utilized including caps, mask, sterile gowns, sterile gloves, sterile drape, hand hygiene and skin antiseptic. A timeout was performed prior to the initiation of the procedure. PROCEDURE: The right lower quadrant was prepped with ChloraPrep in a sterile fashion, and a sterile drape was applied covering the operative field. A sterile gown and sterile gloves were used for the procedure. Local anesthesia was provided with 1% Lidocaine. Under CT guidance, an 18 gauge needle was inserted into the right lower  quadrant abscess. It was removed over an Amplatz wire. Twelve [place] dilator followed by a 12 [place] drain were inserted. The drain was looped and string fixed in the fluid collection. 30 cc frank pus was aspirated. It was sewn to the skin. FINDINGS: Imaging documents placement of a 5 French right lower quadrant abscess drain. IMPRESSION: Successful CT-guided right lower quadrant abscess drain. Electronically Signed   By: Marybelle Killings M.D.   On: 03/21/2016 17:09     Heather Mckendree, MD, FACP, FHM. [facility] Pager [phone]  If 7PM-7AM, please contact night-coverage [email or web address] Password [email or web address] 03/23/2016, 11:20 AM

## 2016-03-23 NOTE — Progress Notes (Signed)
[facility] Progress Note  Chief Complaint:   Crohn's disease  Subjective: feels okay. No abdominal pain. Tolerated clears  Objective:  Vital signs in last 24 hours: Temp:  [97.8 F (36.6 C)-98.1 F (36.7 C)] 98.1 F (36.7 C) (01/26 0258) Pulse Rate:  [81-94] 92 (01/26 0614) Resp:  [16-18] 16 (01/26 0614) BP: (97-101)/(60-74) 101/60 (01/26 0614) SpO2:  [97 %-98 %] 98 % (01/26 0614) Last BM Date: 03/22/16 General:   Alert, well-developed,black male in NAD EENT:  Normal hearing, non icteric sclera, conjunctive pink.  Heart:  Regular rate and rhythm; no murmurs. No lower extremity edema Pulm: Normal respiratory effort Abdomen:  Soft, mildly distended, normal bowel sounds, mild RLQ near drain.  Normal bowel sounds, no masses felt. No hepatomegaly.    Neurologic:  Alert and  oriented x4;  grossly normal neurologically. Psych:  Alert and cooperative. Normal mood and affect.    Intake/Output from previous day: 01/25 0701 - 01/26 0700 In: 2824.3 [P.O.:30; I.V.:2529.3; IV Piggyback:250] Out: 1460 [Urine:1400; Drains:60] Intake/Output this shift: No intake/output data recorded.  Lab Results:  Recent Labs  03/21/16 0542 03/21/16 0700  WBC QUESTIONABLE RESULTS, RECOMMEND RECOLLECT TO VERIFY 12.0*  HGB QUESTIONABLE RESULTS, RECOMMEND RECOLLECT TO VERIFY 9.2*  HCT QUESTIONABLE RESULTS, RECOMMEND RECOLLECT TO VERIFY 29.0*  PLT QUESTIONABLE RESULTS, RECOMMEND RECOLLECT TO VERIFY 382   BMET  Recent Labs  03/21/16 0700 03/22/16 0822  NA 134* 135  K 3.7 4.2  CL 104 103  CO2 23 26  GLUCOSE 125* 141*  BUN 12 12  CREATININE 0.77 0.56*  CALCIUM 8.2* 8.6*   LFT  Recent Labs  03/22/16 0822  PROT 7.8  ALBUMIN 3.0*  AST 16  ALT 11*  ALKPHOS 35*  BILITOT 0.4   PT/INR  Recent Labs  03/21/16 0542 03/21/16 0700  LABPROT QUESTIONABLE RESULTS, RECOMMEND RECOLLECT TO VERIFY 16.5*  INR NOT CALCULATED 1.33   Hepatitis Panel No results for input(s):  HEPBSAG, HCVAB, HEPAIGM, HEPBIGM in the last 72 hours.  Ct Image Guided Drainage By Percutaneous Catheter  Result Date: 03/21/2016 INDICATION: Right lower quadrant abscess EXAM: CT GUIDED DRAINAGE OF A RIGHT LOWER QUADRANT ABSCESS MEDICATIONS: The patient is currently admitted to the hospital and receiving intravenous antibiotics. The antibiotics were administered within an appropriate time frame prior to the initiation of the procedure. ANESTHESIA/SEDATION: mg IV Versed  mcg IV Fentanyl Moderate Sedation Time: The patient was continuously monitored during the procedure by the interventional radiology nurse under my direct supervision. COMPLICATIONS: None immediate. TECHNIQUE: Informed written consent was obtained from the patient after a thorough discussion of the procedural risks, benefits and alternatives. All questions were addressed. Maximal Sterile Barrier Technique was utilized including caps, mask, sterile gowns, sterile gloves, sterile drape, hand hygiene and skin antiseptic. A timeout was performed prior to the initiation of the procedure. PROCEDURE: The right lower quadrant was prepped with ChloraPrep in a sterile fashion, and a sterile drape was applied covering the operative field. A sterile gown and sterile gloves were used for the procedure. Local anesthesia was provided with 1% Lidocaine. Under CT guidance, an 18 gauge needle was inserted into the right lower quadrant abscess. It was removed over an Amplatz wire. Twelve [place] dilator followed by a 12 [place] drain were inserted. The drain was looped and string fixed in the fluid collection. 30 cc frank pus was aspirated. It was sewn to the skin. FINDINGS: Imaging documents placement of a 67 French right lower quadrant abscess drain. IMPRESSION: Successful CT-guided  right lower quadrant abscess drain. Electronically Signed   By: Marybelle Killings M.D.   On: 03/21/2016 17:09    Assessment / Plan:  Small bowel Crohn's with long segment of severe  ileal disease. Admitted with RLQ abscesses, s/p drain placement / aspiration by IR. Cultures pending. Afebrile. WBC ~ 12. -continue zosyn, on day #8 -Continue TNA until sure he will tolerate diet. Will advance to fulls now -Home imuran on hold    LOS: 7 days   Tye Savoy ,NP 03/23/2016, 9:58 AM  Pager number [phone]  GI ATTENDING  Interval history data reviewed. Patient seen and examined. Agree with above interval progress note. Stable. Continue to advance diet as tolerated. Interventional radiology to monitor and manage drainage catheter. Continue other therapies without change  Maxx Pham N. Geri Seminole., M.D. [facility] Division of Gastroenterology

## 2016-03-23 NOTE — Progress Notes (Signed)
Subjective: He is feeling better, some pain from the drain itsself this AM.  Drainage is serosanguinous and not that much.    Objective: Vital signs in last 24 hours: Temp:  [97.8 F (36.6 C)-98.1 F (36.7 C)] 98.1 F (36.7 C) (01/26 3716) Pulse Rate:  [81-94] 92 (01/26 0614) Resp:  [16-18] 16 (01/26 0614) BP: (97-101)/(60-74) 101/60 (01/26 0614) SpO2:  [97 %-98 %] 98 % (01/26 0614) Last BM Date: 03/22/16  30 PO 2750 IV 1400 urine Drain 60 No fever spikes last 24 hours.   No labs this Am Specimen Description ABSCESS ABDOMEN   Special Requests NONE   Gram Stain ABUNDANT WBC PRESENT, PREDOMINANTLY PMN  NO ORGANISMS SEEN      Culture CULTURE REINCUBATED FOR BETTER GROWTH       Intake/Output from previous day: 01/25 0701 - 01/26 0700 In: 2824.3 [P.O.:30; I.V.:2529.3; IV Piggyback:250] Out: 9678 [Urine:1400; Drains:60] Intake/Output this shift: No intake/output data recorded.  General appearance: alert, cooperative and no distress GI: soft sore at the drain site,but the intraabdominal pain that got him here is better.  Lab Results:   Recent Labs  03/21/16 0542 03/21/16 0700  WBC QUESTIONABLE RESULTS, RECOMMEND RECOLLECT TO VERIFY 12.0*  HGB QUESTIONABLE RESULTS, RECOMMEND RECOLLECT TO VERIFY 9.2*  HCT QUESTIONABLE RESULTS, RECOMMEND RECOLLECT TO VERIFY 29.0*  PLT QUESTIONABLE RESULTS, RECOMMEND RECOLLECT TO VERIFY 382    BMET  Recent Labs  03/21/16 0700 03/22/16 0822  NA 134* 135  K 3.7 4.2  CL 104 103  CO2 23 26  GLUCOSE 125* 141*  BUN 12 12  CREATININE 0.77 0.56*  CALCIUM 8.2* 8.6*   PT/INR  Recent Labs  03/21/16 0542 03/21/16 0700  LABPROT QUESTIONABLE RESULTS, RECOMMEND RECOLLECT TO VERIFY 16.5*  INR NOT CALCULATED 1.33     Recent Labs Lab 03/18/16 0800 03/19/16 0530 03/22/16 0822  AST 11* 12* 16  ALT 9* 8* 11*  ALKPHOS 31* 30* 35*  BILITOT 0.6 0.4 0.4  PROT 6.6 7.0 7.8  ALBUMIN 2.7* 2.9* 3.0*     Lipase     Component  Value Date/Time   LIPASE 19 03/16/2016 0101     Studies/Results: Ct Image Guided Drainage By Percutaneous Catheter  Result Date: 03/21/2016 INDICATION: Right lower quadrant abscess EXAM: CT GUIDED DRAINAGE OF A RIGHT LOWER QUADRANT ABSCESS MEDICATIONS: The patient is currently admitted to the hospital and receiving intravenous antibiotics. The antibiotics were administered within an appropriate time frame prior to the initiation of the procedure. ANESTHESIA/SEDATION: mg IV Versed  mcg IV Fentanyl Moderate Sedation Time: The patient was continuously monitored during the procedure by the interventional radiology nurse under my direct supervision. COMPLICATIONS: None immediate. TECHNIQUE: Informed written consent was obtained from the patient after a thorough discussion of the procedural risks, benefits and alternatives. All questions were addressed. Maximal Sterile Barrier Technique was utilized including caps, mask, sterile gowns, sterile gloves, sterile drape, hand hygiene and skin antiseptic. A timeout was performed prior to the initiation of the procedure. PROCEDURE: The right lower quadrant was prepped with ChloraPrep in a sterile fashion, and a sterile drape was applied covering the operative field. A sterile gown and sterile gloves were used for the procedure. Local anesthesia was provided with 1% Lidocaine. Under CT guidance, an 18 gauge needle was inserted into the right lower quadrant abscess. It was removed over an Amplatz wire. Twelve [place] dilator followed by a 12 [place] drain were inserted. The drain was looped and string fixed in the fluid collection. 30 cc frank  pus was aspirated. It was sewn to the skin. FINDINGS: Imaging documents placement of a 25 French right lower quadrant abscess drain. IMPRESSION: Successful CT-guided right lower quadrant abscess drain. Electronically Signed   By: Marybelle Killings M.D.   On: 03/21/2016 17:09    Medications: . enoxaparin (LOVENOX) injection  40 mg  Subcutaneous Q24H  . insulin aspart  0-9 Units Subcutaneous Q8H  . pantoprazole  40 mg Oral Daily  . piperacillin-tazobactam (ZOSYN)  IV  3.375 g Intravenous Q8H  . saccharomyces boulardii  250 mg Oral BID  . sodium chloride flush  10-40 mL Intracatheter Q12H   . sodium chloride 20 mL/hr at 03/23/16 0553  . Marland KitchenTPN (CLINIMIX-E) Adult     And  . fat emulsion    . Marland KitchenTPN (CLINIMIX-E) Adult 80 mL/hr (03/23/16 0115)   Assessment/Plan Crohn's colitis with fluid collections - terminal illeitis -  IR drain - 03/21/16 On Imuran/Prednisone at home FEN: TNA/clear liquids ID: Zosyn 03/15/16 =>>day 8 DVT: Lovenox    Plan":  Continue abx, and drain.  Probally repeat CT in a few days and look at site again.  I will ask about increasing diet, add boost.   LOS: 7 days    Markeis Allman 03/23/2016 [phone]

## 2016-03-24 LAB — GLUCOSE, CAPILLARY
GLUCOSE-CAPILLARY: 121 mg/dL — AB (ref 65–99)
Glucose-Capillary: 124 mg/dL — ABNORMAL HIGH (ref 65–99)
Glucose-Capillary: 130 mg/dL — ABNORMAL HIGH (ref 65–99)

## 2016-03-24 MED ORDER — DEXTROSE 5 % IV SOLN
2.0000 g | INTRAVENOUS | Status: DC
  Administered 2016-03-24 – 2016-03-27 (×4): 2 g via INTRAVENOUS
  Filled 2016-03-24 (×4): qty 2

## 2016-03-24 MED ORDER — CLINIMIX E/DEXTROSE (5/20) 5 % IV SOLN
INTRAVENOUS | Status: AC
  Administered 2016-03-24: 18:00:00 via INTRAVENOUS
  Filled 2016-03-24: qty 1920

## 2016-03-24 MED ORDER — FAT EMULSION 20 % IV EMUL
240.0000 mL | INTRAVENOUS | Status: AC
  Administered 2016-03-24: 240 mL via INTRAVENOUS
  Filled 2016-03-24: qty 250

## 2016-03-24 MED ORDER — ADULT MULTIVITAMIN W/MINERALS CH
1.0000 | ORAL_TABLET | Freq: Every day | ORAL | Status: DC
  Administered 2016-03-24 – 2016-03-27 (×4): 1 via ORAL
  Filled 2016-03-24 (×4): qty 1

## 2016-03-24 NOTE — Progress Notes (Signed)
Referring Physician(s):  Dr. Johnathan Hausen  Supervising Physician: Markus Daft  Patient Status:  [facility] - In-pt  Chief Complaint:  RLQ abscess  Subjective: Patient resting comfortably at time of visit.  Diet advanced to [place] with tolerance; states he may be able to try solids today. Remains on TPN via PICC.   Allergies: Patient has no known allergies.  Medications: Prior to Admission medications   Medication Sig Start Date End Date Taking? Authorizing Provider  acetaminophen (TYLENOL) 500 MG tablet Take 1,000 mg by mouth every 6 (six) hours as needed for moderate pain.   Yes Historical Provider, MD  azaTHIOprine (IMURAN) 50 MG tablet Take 2 tablets by mouth daily. 02/18/16  Yes Historical Provider, MD  predniSONE (DELTASONE) 10 MG tablet Take 5 mg by mouth daily.  03/02/16  Yes Historical Provider, MD     Vital Signs: BP 98/60 (BP Location: Left Arm)   Pulse 87   Temp 97.6 F (36.4 C) (Oral)   Resp 14   Ht 6' (1.829 m)   Wt 173 lb (78.5 kg)   SpO2 97%   BMI 23.46 kg/m   Physical Exam  Awake, alert, in no distress. Abdomen:  RLQ drain in place.  Flushes easily with 5 mL saline. Bulb emptied this AM, still with serosanguinous drainage. Insertion site c/d/i.  Imaging: Ct Abdomen Pelvis W Contrast  Result Date: 03/20/2016 CLINICAL DATA:  Right lower quadrant abdominal pain. History of Crohn' s disease. EXAM: CT ABDOMEN AND PELVIS WITH CONTRAST TECHNIQUE: Multidetector CT imaging of the abdomen and pelvis was performed using the standard protocol following bolus administration of intravenous contrast. CONTRAST:  86m ISOVUE-300 IOPAMIDOL (ISOVUE-300) INJECTION 61% COMPARISON:  03/16/2016 CT scan FINDINGS: Lower chest: Linear subsegmental atelectasis in the posterior basal segment right lower lobe. Density in the cavoatrial junction is probably a central line, correlate with patient history. Hepatobiliary: Unremarkable Pancreas: Unremarkable Spleen: Unremarkable Adrenals/Urinary  Tract: Unremarkable Stomach/Bowel: Similar to prior there is a 14 cm region of severe wall thickening in the terminal ileum with abscess tracking along the right transverse abdominis muscle in the right retroperitoneum to involve the right psoas muscle, volume of abscess approximately 80 cc and containing gas and fluid with thick enhancing margins, this is increased in size compared to the prior exam. On images 56-57 of series 2 there small locules of extraluminal gas medial to the terminal ileum compatible with contained perforation or small microabscesses. Air-fluid level in the contrast in the rectum. Vascular/Lymphatic: Small reactive right inguinal and pelvic lymph nodes. Reproductive: Unremarkable Other: No supplemental non-categorized findings. Musculoskeletal: Sacroiliac joints unremarkable. Partially calcified annulus fibrosis along a disc bulge at L5-S1. IMPRESSION: 1. Severe wall thickening in the distal 14 cm of the terminal ileum, with small amounts of adjacent extraluminal loculated gas containing compatible with micro perforation, and with enlargement of the right transverse abdominis and psoas muscle abscess which is approximately 80 cc in volume. Electronically Signed   By: WVan ClinesM.D.   On: 03/20/2016 12:12   Ct Image Guided Drainage By Percutaneous Catheter  Result Date: 03/21/2016 INDICATION: Right lower quadrant abscess EXAM: CT GUIDED DRAINAGE OF A RIGHT LOWER QUADRANT ABSCESS MEDICATIONS: The patient is currently admitted to the hospital and receiving intravenous antibiotics. The antibiotics were administered within an appropriate time frame prior to the initiation of the procedure. ANESTHESIA/SEDATION: mg IV Versed  mcg IV Fentanyl Moderate Sedation Time: The patient was continuously monitored during the procedure by the interventional radiology nurse under my direct supervision.  COMPLICATIONS: None immediate. TECHNIQUE: Informed written consent was obtained from the patient  after a thorough discussion of the procedural risks, benefits and alternatives. All questions were addressed. Maximal Sterile Barrier Technique was utilized including caps, mask, sterile gowns, sterile gloves, sterile drape, hand hygiene and skin antiseptic. A timeout was performed prior to the initiation of the procedure. PROCEDURE: The right lower quadrant was prepped with ChloraPrep in a sterile fashion, and a sterile drape was applied covering the operative field. A sterile gown and sterile gloves were used for the procedure. Local anesthesia was provided with 1% Lidocaine. Under CT guidance, an 18 gauge needle was inserted into the right lower quadrant abscess. It was removed over an Amplatz wire. Twelve [place] dilator followed by a 12 [place] drain were inserted. The drain was looped and string fixed in the fluid collection. 30 cc frank pus was aspirated. It was sewn to the skin. FINDINGS: Imaging documents placement of a 77 French right lower quadrant abscess drain. IMPRESSION: Successful CT-guided right lower quadrant abscess drain. Electronically Signed   By: Marybelle Killings M.D.   On: 03/21/2016 17:09    Labs:  CBC:  Recent Labs  03/19/16 0530 03/20/16 0534 03/21/16 0542 03/21/16 0700  WBC 10.8* 12.7* QUESTIONABLE RESULTS, RECOMMEND RECOLLECT TO VERIFY 12.0*  HGB 9.2* 9.2* QUESTIONABLE RESULTS, RECOMMEND RECOLLECT TO VERIFY 9.2*  HCT 29.4* 29.1* QUESTIONABLE RESULTS, RECOMMEND RECOLLECT TO VERIFY 29.0*  PLT 411* 368 QUESTIONABLE RESULTS, RECOMMEND RECOLLECT TO VERIFY 382    COAGS:  Recent Labs  03/21/16 0542 03/21/16 0700  INR NOT CALCULATED 1.33    BMP:  Recent Labs  03/19/16 0530 03/20/16 0534 03/21/16 0700 03/22/16 0822  NA 138 138 134* 135  K 3.6 3.7 3.7 4.2  CL 106 107 104 103  CO2 27 24 23 26   GLUCOSE 107* 124* 125* 141*  BUN 11 12 12 12   CALCIUM 8.5* 8.4* 8.2* 8.6*  CREATININE 0.74 0.71 0.77 0.56*  GFRNONAA >60 >60 >60 >60  GFRAA >60 >60 >60 >60    LIVER  FUNCTION TESTS:  Recent Labs  03/16/16 0101 03/18/16 0800 03/19/16 0530 03/22/16 0822  BILITOT 0.4 0.6 0.4 0.4  AST 13* 11* 12* 16  ALT 12* 9* 8* 11*  ALKPHOS 39 31* 30* 35*  PROT 7.8 6.6 7.0 7.8  ALBUMIN 3.3* 2.7* 2.9* 3.0*    Assessment and Plan: Crohn's colitis; RLQ abscess s/p RLQ drain placement on 03/21/16 by Dr. Barbie Banner.  Drain functioning well; continues to serosanguinous output- 60 mL output yesterday.  Cultures pending.  Planning for repeat CT early next week.  Continue drain care.  IR to follow.   Electronically Signed: Docia Barrier 03/24/2016, 9:19 AM   I spent a total of 15 Minutes at the the patient's bedside AND on the patient's hospital floor or unit, greater than 50% of which was counseling/coordinating care for RLQ abscess.

## 2016-03-24 NOTE — Progress Notes (Signed)
HISTORY OF PRESENT ILLNESS:  Larry [name] is a 36 y.o. male with complicated Crohn's disease as previously outlined. Tolerating diet well. Bowel movement yesterday. Less pain. No real complaints. Less drainage. Being followed by surgery, IR, and medicine.  REVIEW OF SYSTEMS:  All non-GI ROS negative except for  Past Medical History:  Diagnosis Date  . Crohn's disease [facility])     Past Surgical History:  Procedure Laterality Date  . bowel abscess from crohns       Social History Larry [name]  reports that he has never smoked. He has never used smokeless tobacco. He reports that he does not drink alcohol or use drugs.  family history is not on file.  No Known Allergies     PHYSICAL EXAMINATION: Vital signs: BP 98/60 (BP Location: Left Arm)   Pulse 87   Temp 97.6 F (36.4 C) (Oral)   Resp 14   Ht 6' (1.829 m)   Wt 78.5 kg (173 lb)   SpO2 97%   BMI 23.46 kg/m   Constitutional: generally well-appearing, no acute distress Psychiatric: alert and oriented x3, cooperative Eyes: extraocular movements intact, anicteric, conjunctiva pink Mouth: oral pharynx moist, no lesions Neck: supple no lymphadenopathy Cardiovascular: heart regular rate and rhythm, no murmur Lungs: clear to auscultation bilaterally Abdomen: soft, nontender, nondistended, no obvious ascites, no peritoneal signs, normal bowel sounds, no organomegaly. Changes strain appears unremarkable Rectal: Omitted Extremities: no clubbing cyanosis or lower extremity edema bilaterally Skin: no lesions on visible extremities Neuro: No focal deficits.   ASSESSMENT:  #1. Ileal Crohn's with abscess status post percutaneous drainage   PLAN:  #1. Continue current therapies #2. IR continue to monitor drain  #3. Imaging to be repeated this week #4. Continued management of diet #5. Ambulate. Continue

## 2016-03-24 NOTE — Progress Notes (Signed)
Patient ID: Larry [name], male   DOB: 1980-06-01, 36 y.o.   MRN: [id number]     Subjective: No C/O, tol FL diet well  Objective: Vital signs in last 24 hours: Temp:  [97.4 F (36.3 C)-97.6 F (36.4 C)] 97.6 F (36.4 C) (01/26 2036) Pulse Rate:  [87-103] 87 (01/26 2036) Resp:  [14-16] 14 (01/26 2036) BP: (98-116)/(56-60) 98/60 (01/26 2036) SpO2:  [97 %-98 %] 97 % (01/26 2036) Last BM Date: 03/23/16  Intake/Output from previous day: 01/26 0701 - 01/27 0700 In: 2019.7 [P.O.:630; I.V.:1229.7; IV Piggyback:150] Out: 465 [Urine:450; Drains:15] Intake/Output this shift: No intake/output data recorded.  General appearance: alert, cooperative and no distress GI: normal findings: soft, non-tender and Drain with slight purulent/bloody drainage  Lab Results:  No results for input(s): WBC, HGB, HCT, PLT in the last 72 hours. BMET  Recent Labs  03/22/16 0822  NA 135  K 4.2  CL 103  CO2 26  GLUCOSE 141*  BUN 12  CREATININE 0.56*  CALCIUM 8.6*     Studies/Results: No results found.  Anti-infectives: Anti-infectives    Start     Dose/Rate Route Frequency Ordered Stop   03/16/16 1400  piperacillin-tazobactam (ZOSYN) IVPB 3.375 g     3.375 g 12.5 mL/hr over 240 Minutes Intravenous Every 8 hours 03/16/16 0548     03/16/16 0415  piperacillin-tazobactam (ZOSYN) IVPB 3.375 g     3.375 g 12.5 mL/hr over 240 Minutes Intravenous  Once 03/16/16 0411 03/16/16 0844      Assessment/Plan: Crohn's Dz with psoas abscess, S/P perc drainage.  Steady improvement.  Advance diet. Cont current Rx. Repeat CT planned early next week.    LOS: 8 days    Larry [name] 03/24/2016

## 2016-03-24 NOTE — Progress Notes (Signed)
PROGRESS NOTE                                                                                                                                                                                                             Patient Demographics:    Larry [name], is a 36 y.o. male, DOB - [birth year], LTY:[id number]  Admit date - 03/16/2016   Admitting Physician Norval Morton, MD  Outpatient Primary MD for the patient is Pcp Not In System  LOS - 8   Chief Complaint  Patient presents with  . Abdominal Pain       Brief Narrative   36 year old male with PMH of Crohn's colitis on azathioprine, additionally diagnosed in 2015 when he had presented with abscesses that required drainage. He followed up with Dr. Britta Mccreedy in [place] who apparently did colonoscopy and was reportedly normal. He has been on Imuran 100 MG daily since that time along with prednisone (since August when placed on prednisone for a flare by Dr. Britta Mccreedy). Prednisone was tapered to 5 mg daily prior to this admission. Dr. Britta Mccreedy has relocated and patient has not seen him back since August. He now presented with right lower quadrant abdominal pain, hard stool every 3 days or so without rectal bleeding or diarrhea, reduced appetite and weight loss. Initial CT abdomen showed active Crohn's colitis with abscess collections in the right lower quadrant involving iliopsoas muscle.    Subjective:    Larry [name] is status post RLQ percutaneous drain by IR on 1/24. Minimal pain around the drain site with activity. Otherwise no abdominal pain. Tolerated. Full liquids. Had small BM yesterday. No nausea or vomiting.   Assessment  & Plan :    Principal Problem:   Crohn's colitis, with abscess ([place]) Active Problems:   Crohn's colitis ([place])   Leukocytosis   Anemia   Abnormal CT of the abdomen   Crohn's disease of ileum with abscess ([place])   Feeding difficulties   Crohn's Terminal ileitis, with Escherichia  coli abscess - Patient with known history of Crohn's disease, CT abdomen pelvis significant for Crohn's colitis with abscess. - GI was consulted. Imuran was discontinued. As per GI, he is going to need biological therapy when these acute abscesses resolve. Hepatitis B surface antibody: 4.1 (inconsistent with immunity), QuantiFERON TB gold 03/16/16: Indeterminate. Hepatitis B surface antigen: Negative.  As discussed with GI, may need hepatitis immunization and repeating QuantiFERON TB gold or PPD prior to biological. - As per IR, initially his abscesses were not amenable to percutaneous drainage. He is being treated with bowel rest/nothing by mouth, IV Zosyn, TPN. - GI input greatly appreciated, continue nothing by mouth, bowel rest, and IV Zosyn (since 03/15/16), anticipated prolonged course of bowel rest,PICC line inserted 1/20, and started on TPN. Surgeons were consulted and followed along. He indicated that if no resolution with conservative measures, may require surgical intervention for drainage and resection of terminal ileum and cecum. - Reimaged with CT abdomen on 03/20/16 which showed abscesses had enlarged. IR was re consulted for percutaneous drainage. - IR placed CT-guided drain on 03/21/16. Abscess culture shows Escherichia coli. As per GI follow-up, this was the second time that the patient has had drainage of Crohn's related abscess and when infection is optimally controlled, they think that the next step is ileal resection followed by outpatient GI management of his Crohn's after recovering from surgery. Surgery most likely will be electively and as outpatient. Tolerated full liquids. Plan is to continue TNA until we are sure that he is tolerating adequate diet. Overall clinically improved. Not much drainage from RLQ percutaneous drain. Change antibiotics to IV ceftriaxone. Repeat CT abdomen and week to reassess abscess.  Anemia - Reasonably stable since admission and has been in the low 9 g per  DL range for the last several days. Likely anemia of chronic disease.   Thrombocytosis - likely reactive . Resolved.     Code Status : Full  Family Communication  : None at bedside  Disposition Plan  : home when stable  Consults  :  GI, Gen surgery, IR  Procedures  : CT-guided drain catheter placement into abscess by IR on 1/24., R upper arm PICC line.  DVT Prophylaxis  :  Lovenox -SCDs   Lab Results  Component Value Date   PLT 382 03/21/2016    Antibiotics  :    Anti-infectives    Start     Dose/Rate Route Frequency Ordered Stop   03/16/16 1400  piperacillin-tazobactam (ZOSYN) IVPB 3.375 g     3.375 g 12.5 mL/hr over 240 Minutes Intravenous Every 8 hours 03/16/16 0548     03/16/16 0415  piperacillin-tazobactam (ZOSYN) IVPB 3.375 g     3.375 g 12.5 mL/hr over 240 Minutes Intravenous  Once 03/16/16 0411 03/16/16 0844        Objective:   Vitals:   03/22/16 2152 03/23/16 0614 03/23/16 1352 03/23/16 2036  BP: 98/74 101/60 (!) 116/56 98/60  Pulse: 81 92 (!) 103 87  Resp: 16 16 16 14   Temp: 98 F (36.7 C) 98.1 F (36.7 C) 97.4 F (36.3 C) 97.6 F (36.4 C)  TempSrc: Oral Oral Oral Oral  SpO2: 97% 98% 98% 97%  Weight:      Height:        Wt Readings from Last 3 Encounters:  03/16/16 78.5 kg (173 lb)     Intake/Output Summary (Last 24 hours) at 03/24/16 1129 Last data filed at 03/24/16 0531  Gross per 24 hour  Intake          1779.67 ml  Output              465 ml  Net          1314.67 ml     Physical Exam  Gen. exam: Awake Alert, Oriented X 3,  Neck: Supple Neck,No JVD,  Respiratory system: Symmetrical Chest wall movement, Good air movement bilaterally, CTAB CVS: RRR,No Gallops,Rubs or new Murmurs, No Parasternal Heave  GI: Nondistended and soft. RLQ drain with small amount of bloody drainage. No tenderness or peritoneal signs. Normal bowel sounds heard. Extremities: No Cyanosis, Clubbing or edema, No new Rash or bruise   CNS: Alert and oriented.  No focal deficits.    Data Review:    CBC  Recent Labs Lab 03/18/16 0800 03/19/16 0530 03/20/16 0534 03/21/16 0542 03/21/16 0700  WBC 12.2* 10.8* 12.7* QUESTIONABLE RESULTS, RECOMMEND RECOLLECT TO VERIFY 12.0*  HGB 8.8* 9.2* 9.2* QUESTIONABLE RESULTS, RECOMMEND RECOLLECT TO VERIFY 9.2*  HCT 28.2* 29.4* 29.1* QUESTIONABLE RESULTS, RECOMMEND RECOLLECT TO VERIFY 29.0*  PLT 411* 411* 368 QUESTIONABLE RESULTS, RECOMMEND RECOLLECT TO VERIFY 382  MCV 80.6 80.1 81.3 QUESTIONABLE RESULTS, RECOMMEND RECOLLECT TO VERIFY 79.2  MCH 25.1* 25.1* 25.7* QUESTIONABLE RESULTS, RECOMMEND RECOLLECT TO VERIFY 25.1*  MCHC 31.2 31.3 31.6 QUESTIONABLE RESULTS, RECOMMEND RECOLLECT TO VERIFY 31.7  RDW 14.3 14.0 14.1 QUESTIONABLE RESULTS, RECOMMEND RECOLLECT TO VERIFY 13.7  LYMPHSABS 1.2  --   --   --   --   MONOABS 1.0  --   --   --   --   EOSABS 0.0  --   --   --   --   BASOSABS 0.0  --   --   --   --     Chemistries   Recent Labs Lab 03/18/16 0800 03/19/16 0530 03/20/16 0534 03/21/16 0700 03/22/16 0822  NA 135 138 138 134* 135  K 3.5 3.6 3.7 3.7 4.2  CL 102 106 107 104 103  CO2 26 27 24 23 26   GLUCOSE 99 107* 124* 125* 141*  BUN 12 11 12 12 12   CREATININE 0.77 0.74 0.71 0.77 0.56*  CALCIUM 8.3* 8.5* 8.4* 8.2* 8.6*  MG 1.8 2.0 2.0  --  2.1  AST 11* 12*  --   --  16  ALT 9* 8*  --   --  11*  ALKPHOS 31* 30*  --   --  35*  BILITOT 0.6 0.4  --   --  0.4   ------------------------------------------------------------------------------------------------------------------ No results for input(s): CHOL, HDL, LDLCALC, TRIG, CHOLHDL, LDLDIRECT in the last 72 hours.  No results found for: HGBA1C ------------------------------------------------------------------------------------------------------------------ No results for input(s): TSH, T4TOTAL, T3FREE, THYROIDAB in the last 72 hours.  Invalid input(s):  FREET3 ------------------------------------------------------------------------------------------------------------------ No results for input(s): VITAMINB12, FOLATE, FERRITIN, TIBC, IRON, RETICCTPCT in the last 72 hours.  Coagulation profile  Recent Labs Lab 03/21/16 0542 03/21/16 0700  INR NOT CALCULATED 1.33    No results for input(s): DDIMER in the last 72 hours.  Cardiac Enzymes No results for input(s): CKMB, TROPONINI, MYOGLOBIN in the last 168 hours.  Invalid input(s): CK ------------------------------------------------------------------------------------------------------------------ No results found for: BNP  Inpatient Medications  Scheduled Meds: . enoxaparin (LOVENOX) injection  40 mg Subcutaneous Q24H  . insulin aspart  0-9 Units Subcutaneous Q8H  . multivitamin with minerals  1 tablet Oral Daily  . pantoprazole  40 mg Oral Daily  . piperacillin-tazobactam (ZOSYN)  IV  3.375 g Intravenous Q8H  . saccharomyces boulardii  250 mg Oral BID  . sodium chloride flush  10-40 mL Intracatheter Q12H   Continuous Infusions: . sodium chloride 20 mL/hr at 03/23/16 0553  . Marland KitchenTPN (CLINIMIX-E) Adult     And  . fat emulsion    . Marland KitchenTPN (CLINIMIX-E) Adult 80 mL/hr at 03/24/16 0129   PRN Meds:.acetaminophen, albuterol, iopamidol, iopamidol,  morphine injection, ondansetron **OR** ondansetron (ZOFRAN) IV, sodium chloride flush  Micro Results Recent Results (from the past 240 hour(s))  Aerobic/Anaerobic Culture (surgical/deep wound)     Status: None (Preliminary result)   Collection Time: 03/21/16  5:08 PM  Result Value Ref Range Status   Specimen Description ABSCESS ABDOMEN  Final   Special Requests NONE  Final   Gram Stain   Final    ABUNDANT WBC PRESENT, PREDOMINANTLY PMN NO ORGANISMS SEEN Performed at [facility], [address], [place], [place] [address]    Culture   Final    RARE ESCHERICHIA COLI NO ANAEROBES ISOLATED; CULTURE IN PROGRESS FOR 5 DAYS    Report  Status PENDING  Incomplete   Organism ID, Bacteria ESCHERICHIA COLI  Final      Susceptibility   Escherichia coli - MIC*    AMPICILLIN >=32 RESISTANT Resistant     CEFAZOLIN <=4 SENSITIVE Sensitive     CEFEPIME <=1 SENSITIVE Sensitive     CEFTAZIDIME <=1 SENSITIVE Sensitive     CEFTRIAXONE <=1 SENSITIVE Sensitive     CIPROFLOXACIN >=4 RESISTANT Resistant     GENTAMICIN <=1 SENSITIVE Sensitive     IMIPENEM <=0.25 SENSITIVE Sensitive     TRIMETH/SULFA <=20 SENSITIVE Sensitive     AMPICILLIN/SULBACTAM 16 INTERMEDIATE Intermediate     PIP/TAZO <=4 SENSITIVE Sensitive     Extended ESBL NEGATIVE Sensitive     * RARE ESCHERICHIA COLI    Radiology Reports Ct Abdomen Pelvis W Contrast  Result Date: 03/20/2016 CLINICAL DATA:  Right lower quadrant abdominal pain. History of Crohn' s disease. EXAM: CT ABDOMEN AND PELVIS WITH CONTRAST TECHNIQUE: Multidetector CT imaging of the abdomen and pelvis was performed using the standard protocol following bolus administration of intravenous contrast. CONTRAST:  20m ISOVUE-300 IOPAMIDOL (ISOVUE-300) INJECTION 61% COMPARISON:  03/16/2016 CT scan FINDINGS: Lower chest: Linear subsegmental atelectasis in the posterior basal segment right lower lobe. Density in the cavoatrial junction is probably a central line, correlate with patient history. Hepatobiliary: Unremarkable Pancreas: Unremarkable Spleen: Unremarkable Adrenals/Urinary Tract: Unremarkable Stomach/Bowel: Similar to prior there is a 14 cm region of severe wall thickening in the terminal ileum with abscess tracking along the right transverse abdominis muscle in the right retroperitoneum to involve the right psoas muscle, volume of abscess approximately 80 cc and containing gas and fluid with thick enhancing margins, this is increased in size compared to the prior exam. On images 56-57 of series 2 there small locules of extraluminal gas medial to the terminal ileum compatible with contained perforation or small  microabscesses. Air-fluid level in the contrast in the rectum. Vascular/Lymphatic: Small reactive right inguinal and pelvic lymph nodes. Reproductive: Unremarkable Other: No supplemental non-categorized findings. Musculoskeletal: Sacroiliac joints unremarkable. Partially calcified annulus fibrosis along a disc bulge at L5-S1. IMPRESSION: 1. Severe wall thickening in the distal 14 cm of the terminal ileum, with small amounts of adjacent extraluminal loculated gas containing compatible with micro perforation, and with enlargement of the right transverse abdominis and psoas muscle abscess which is approximately 80 cc in volume. Electronically Signed   By: WVan ClinesM.D.   On: 03/20/2016 12:12   Ct Abdomen Pelvis W Contrast  Result Date: 03/16/2016 CLINICAL DATA:  Abdominal pain. Right lower quadrant pain. Weight loss. Crohn's disease. EXAM: CT ABDOMEN AND PELVIS WITH CONTRAST TECHNIQUE: Multidetector CT imaging of the abdomen and pelvis was performed using the standard protocol following bolus administration of intravenous contrast. CONTRAST:  1071mISOVUE-300 IOPAMIDOL (ISOVUE-300) INJECTION 61% COMPARISON:  CT abdomen/ pelvis 10/28/2015 at [facility]. FINDINGS: Lower chest: The lung bases are clear. Hepatobiliary: No focal liver abnormality is seen. No gallstones, gallbladder wall thickening, or biliary dilatation. Pancreas: No ductal dilatation or inflammation. Spleen: Normal in size without focal abnormality. Adrenals/Urinary Tract: Normal adrenal glands. Symmetric renal enhancement without hydronephrosis. Urinary bladder is physiologically distended. Stomach/Bowel: Wall thickening involving the terminal ileum and cecum with moderate adjacent mesenteric inflammation consistent with active Crohn's. There multiple adjacent irregular fluid collections with peripheral enhancement in the right lower quadrant consistent with abscess. These involve both the adjacent fat and right iliopsoas  muscle. A discrete collection anteriorly measures 2.8 x 1.7 cm. This may be contiguous with the more posterior collection measuring 2.5 x 4.7 cm, which involves the iliopsoas muscle. The appendix is tentatively identified containing intraluminal fluid with similar stranding to the adjacent inflamed cecum and ascending colon. No other bowel wall thickening to suggest additional sites of active Crohn's. Moderate stool in the more distal colon. Vascular/Lymphatic: No significant vascular findings are present. No enlarged abdominal or pelvic lymph nodes. Reproductive: Prostate is unremarkable. Other: Right lower quadrant abscesses as described. No pelvic free fluid. No free air. Musculoskeletal: Heterogeneous edema of the right iliopsoas muscle with small intramuscular abscess as described. Mild heterogeneous enlargement of the distal iliopsoas likely myositis. There are no acute or suspicious osseous abnormalities. IMPRESSION: Active Crohn's involving the terminal ileum and cecum. There are adjacent irregular abscesses in the right lower quadrant involving the adjacent fat and iliopsoas muscle. Fluid collections are irregular in shape, and measure approximately 2.8 and 4.7 cm respectively. Electronically Signed   By: Jeb Levering M.D.   On: 03/16/2016 03:24   Ct Image Guided Drainage By Percutaneous Catheter  Result Date: 03/21/2016 INDICATION: Right lower quadrant abscess EXAM: CT GUIDED DRAINAGE OF A RIGHT LOWER QUADRANT ABSCESS MEDICATIONS: The patient is currently admitted to the hospital and receiving intravenous antibiotics. The antibiotics were administered within an appropriate time frame prior to the initiation of the procedure. ANESTHESIA/SEDATION: mg IV Versed  mcg IV Fentanyl Moderate Sedation Time: The patient was continuously monitored during the procedure by the interventional radiology nurse under my direct supervision. COMPLICATIONS: None immediate. TECHNIQUE: Informed written consent was  obtained from the patient after a thorough discussion of the procedural risks, benefits and alternatives. All questions were addressed. Maximal Sterile Barrier Technique was utilized including caps, mask, sterile gowns, sterile gloves, sterile drape, hand hygiene and skin antiseptic. A timeout was performed prior to the initiation of the procedure. PROCEDURE: The right lower quadrant was prepped with ChloraPrep in a sterile fashion, and a sterile drape was applied covering the operative field. A sterile gown and sterile gloves were used for the procedure. Local anesthesia was provided with 1% Lidocaine. Under CT guidance, an 18 gauge needle was inserted into the right lower quadrant abscess. It was removed over an Amplatz wire. Twelve [place] dilator followed by a 12 [place] drain were inserted. The drain was looped and string fixed in the fluid collection. 30 cc frank pus was aspirated. It was sewn to the skin. FINDINGS: Imaging documents placement of a 65 French right lower quadrant abscess drain. IMPRESSION: Successful CT-guided right lower quadrant abscess drain. Electronically Signed   By: Marybelle Killings M.D.   On: 03/21/2016 17:09     Zyara Riling, MD, FACP, FHM. [facility] Pager [phone]  If 7PM-7AM, please contact night-coverage [email or web address] Password [facility] 03/24/2016, 11:29 AM

## 2016-03-24 NOTE — Progress Notes (Addendum)
PHARMACY - ADULT TOTAL PARENTERAL NUTRITION CONSULT NOTE   Pharmacy Consult for TPN Indication: Crohn's disease on bowel rest with multiple abscesses   Patient Measurements: Height: 6' (182.9 cm) Weight: 173 lb (78.5 kg) IBW/kg (Calculated) : 77.6 TPN AdjBW (KG): 78.5 Body mass index is 23.46 kg/m. Usual Weight: 95kg (7 mo ago)  Insulin Requirements: no SSI required/24h  Current Nutrition: full liquid diet started 1/27  IVF: NS at 20 ml/hr  Central access: PICC placed 1/20 TPN start date: 1/20  ASSESSMENT                                                                                                     HPI:  89 YOM with history of Crohn's disease presents 1/19 to [facility] ED with c/o abd pain.  CT reveals active Crohn's and RLQ abscesses and fluid collection. These are not amendable to IR drainage.  GI has stated it was reasonable to start TPN since will be NPO and will have follow- scans week of 1/22 to determine if will need surgical intervention  Significant events:  1/23: CTscan abd/pelvis = severe wall thickening of terminal ileum w/ small amount loculated gas c/w microperforation and enlargement of transverse abdominis and psoas muscle abscess 1/24: IR drain placement for RLQ abscess  Today, 03/24/2016:   Glucose - no PMH of DM. CBGs controlled with minimal SSI use  Electrolytes - per 1/25 labs, lytes WNL including corrected Calcium  Renal -WNL  LFTs - ALT low  TGs - WNL 98 (1/21)  Prealbumin - 13.6 (1/21)  NUTRITIONAL GOALS                                                                                             RD recs: 2125 - 2480 kcals, 95-110 gm protein Clinimix 5/20 at a goal rate of 44m/hr + 20% fat emulsion at 250mhr over 12h to provide: 96g/day protein, 2169Kcal/day.  All Clinimix products are currently on backorder.  Pharmacy will use Clinimix product based on what we have available in stock that will most closely match patient's needs.  Formulation  changes will be communicated in progress notes.  PLAN  At 1800 today:  Tolerating TPN at goal, Continue Clinimix E 5/20 to 66m/hr (goal)  20% fat emulsion at 241mhr over 12h   Trace elements are on national backorder. Since patient is tolerating PO meds, will order for daily oral multivitamin with elements.  Continue IVF at 2086mr.  Sensitive SSI q8h as he is requiring minimal coverage with TPN at goal. No coverage for CBG 121-150.  TPN lab panels on Mondays & Thursdays  F/u daily. Minimal PO intake recorded of full liquid diet. F/u intake and will reduce TPN accordingly.  AmaHershal CoriaharmD, BCPS Pager: [phone]/2018 10:25 AM

## 2016-03-25 LAB — GLUCOSE, CAPILLARY
GLUCOSE-CAPILLARY: 126 mg/dL — AB (ref 65–99)
GLUCOSE-CAPILLARY: 118 mg/dL — AB (ref 65–99)
Glucose-Capillary: 149 mg/dL — ABNORMAL HIGH (ref 65–99)

## 2016-03-25 MED ORDER — CLINIMIX E/DEXTROSE (5/20) 5 % IV SOLN
INTRAVENOUS | Status: DC
  Administered 2016-03-25: 18:00:00 via INTRAVENOUS
  Filled 2016-03-25: qty 1920

## 2016-03-25 MED ORDER — FAT EMULSION 20 % IV EMUL
240.0000 mL | INTRAVENOUS | Status: DC
  Administered 2016-03-25: 240 mL via INTRAVENOUS
  Filled 2016-03-25: qty 250

## 2016-03-25 NOTE — Progress Notes (Signed)
Patient ID: Larry [name], male   DOB: [birth year], 35 y.o.   MRN: [id number]     Subjective: No C/O, tol soft diet well  Objective: Vital signs in last 24 hours: Temp:  [97.4 F (36.3 C)-98.1 F (36.7 C)] 98.1 F (36.7 C) (01/28 0556) Pulse Rate:  [80-89] 86 (01/28 0556) Resp:  [16] 16 (01/28 0556) BP: (100-111)/(54-67) 100/55 (01/28 0556) SpO2:  [97 %-99 %] 97 % (01/28 0556) Last BM Date: 03/23/16  Intake/Output from previous day: 01/27 0701 - 01/28 0700 In: 485 [P.O.:480] Out: 640 [Urine:600; Drains:40] Intake/Output this shift: No intake/output data recorded.  General appearance: alert, cooperative and no distress GI: normal findings: soft, non-tender and Drain with slight purulent/bloody drainage  Lab Results:  No results for input(s): WBC, HGB, HCT, PLT in the last 72 hours. BMET No results for input(s): NA, K, CL, CO2, GLUCOSE, BUN, CREATININE, CALCIUM in the last 72 hours.   Studies/Results: No results found.  Anti-infectives: Anti-infectives    Start     Dose/Rate Route Frequency Ordered Stop   03/24/16 1200  cefTRIAXone (ROCEPHIN) 2 g in dextrose 5 % 50 mL IVPB     2 g 100 mL/hr over 30 Minutes Intravenous Every 24 hours 03/24/16 1133     03/16/16 1400  piperacillin-tazobactam (ZOSYN) IVPB 3.375 g  Status:  Discontinued     3.375 g 12.5 mL/hr over 240 Minutes Intravenous Every 8 hours 03/16/16 0548 03/24/16 1133   03/16/16 0415  piperacillin-tazobactam (ZOSYN) IVPB 3.375 g     3.375 g 12.5 mL/hr over 240 Minutes Intravenous  Once 03/16/16 0411 03/16/16 0844      Assessment/Plan: Crohn's Dz with psoas abscess, S/P perc drainage.  Steady improvement.  Tolerating diet. Cont current Rx. Repeat CT planned early next week, ? Tomorrow.    LOS: 9 days    Lota Leamer T 03/25/2016

## 2016-03-25 NOTE — Progress Notes (Signed)
HISTORY OF PRESENT ILLNESS:  Larry [name] is a 36 y.o. male with ileal Crohn's complicated by recurrent abscess. Status post drainage procedure. Patient is doing well. Tolerating diet. No new complaints. Mother in room  REVIEW OF SYSTEMS:  All non-GI ROS negative except for  Past Medical History:  Diagnosis Date  . Crohn's disease [facility])     Past Surgical History:  Procedure Laterality Date  . bowel abscess from crohns       Social History Larry [name]  reports that he has never smoked. He has never used smokeless tobacco. He reports that he does not drink alcohol or use drugs.  family history is not on file.  No Known Allergies     PHYSICAL EXAMINATION: Vital signs: BP (!) 104/53 (BP Location: Left Arm)   Pulse 86   Temp 98 F (36.7 C) (Oral)   Resp 16   Ht 6' (1.829 m)   Wt 78.5 kg (173 lb)   SpO2 98%   BMI 23.46 kg/m  General: Well-developed, well-nourished, no acute distress HEENT: Sclerae are anicteric, conjunctiva pink. Oral mucosa intact Lungs: Clear Heart: Regular Abdomen: soft, nontender, nondistended, no obvious ascites, no peritoneal signs, normal bowel sounds. No organomegaly.Drain in place Extremities: No edema Psychiatric: alert and oriented x3. Cooperative     ASSESSMENT:  #1. Ileal Crohn's complicated by abscess status post percutaneous drainage placement. Doing well  PLAN:  1. Continue to advance diet 2. Continue antibiotics 3. Repeat CT tomorrow 4. Ongoing management of drain per IR 4. Ongoing surgical and medical GI evaluations.

## 2016-03-25 NOTE — Progress Notes (Signed)
PHARMACY - ADULT TOTAL PARENTERAL NUTRITION CONSULT NOTE   Pharmacy Consult for TPN Indication: Crohn's disease on bowel rest with multiple abscesses   Patient Measurements: Height: 6' (182.9 cm) Weight: 173 lb (78.5 kg) IBW/kg (Calculated) : 77.6 TPN AdjBW (KG): 78.5 Body mass index is 23.46 kg/m. Usual Weight: 95kg (7 mo ago)  Insulin Requirements: no SSI required/24h  Current Nutrition: full liquid diet started 1/27  IVF: NS at 20 ml/hr  Central access: PICC placed 1/20 TPN start date: 1/20  ASSESSMENT                                                                                                     HPI:  [id number] YOM with history of Crohn's disease presents 1/19 to [facility] ED with c/o abd pain.  CT reveals active Crohn's and RLQ abscesses and fluid collection. These are not amendable to IR drainage.  GI has stated it was reasonable to start TPN since will be NPO and will have follow- scans week of 1/22 to determine if will need surgical intervention  Significant events:  1/23: CTscan abd/pelvis = severe wall thickening of terminal ileum w/ small amount loculated gas c/w microperforation and enlargement of transverse abdominis and psoas muscle abscess 1/24: IR drain placement for RLQ abscess  Today, 03/25/2016:   Glucose - no PMH of DM. CBGs controlled with minimal SSI use  Electrolytes - per 1/25 labs, lytes WNL including corrected Calcium  Renal -WNL  LFTs - ALT low  TGs - WNL 98 (1/21)  Prealbumin - 13.6 (1/21)  NUTRITIONAL GOALS                                                                                             RD recs: 2125 - 2480 kcals, 95-110 gm protein Clinimix 5/20 at a goal rate of 7m/hr + 20% fat emulsion at 266mhr over 12h to provide: 96g/day protein, 2169Kcal/day.  All Clinimix products are currently on backorder.  Pharmacy will use Clinimix product based on what we have available in stock that will most closely match patient's needs.  Formulation  changes will be communicated in progress notes.  PLAN  At 1800 today:  Tolerating TPN at goal, Continue Clinimix E 5/20 to 4m/hr (goal)  20% fat emulsion at 264mhr over 12h   Trace elements are on national backorder. Since patient is tolerating PO meds, will order for daily oral multivitamin with elements.  Continue IVF at 2034mr.  Sensitive SSI q8h as he is requiring minimal coverage with TPN at goal. No coverage for CBG 121-150.  TPN lab panels on Mondays & Thursdays  F/u advancement of diet and orders for weaning off TPN.  AmaHershal CoriaharmD, BCPS Pager: [phone]/2018 9:02 AM

## 2016-03-25 NOTE — Progress Notes (Addendum)
PROGRESS NOTE                                                                                                                                                                                                             Patient Demographics:    Larry [name], is a 36 y.o. male, DOB - Jan 05, 1981, GTX:[id number]  Admit date - 03/16/2016   Admitting Physician Norval Morton, MD  Outpatient Primary MD for the patient is Pcp Not In System  LOS - 9   Chief Complaint  Patient presents with  . Abdominal Pain       Brief Narrative   36 year old male with PMH of Crohn's colitis on azathioprine, additionally diagnosed in 2015 when he had presented with abscesses that required drainage. He followed up with Dr. Britta Mccreedy in [place] who apparently did colonoscopy and was reportedly normal. He has been on Imuran 100 MG daily since that time along with prednisone (since August when placed on prednisone for a flare by Dr. Britta Mccreedy). Prednisone was tapered to 5 mg daily prior to this admission. Dr. Britta Mccreedy has relocated and patient has not seen him back since August. He now presented with right lower quadrant abdominal pain, hard stool every 3 days or so without rectal bleeding or diarrhea, reduced appetite and weight loss. Initial CT abdomen showed active Crohn's colitis with abscess collections in the right lower quadrant involving iliopsoas muscle.    Subjective:    Crist Kruszka continues to progress well. Denies pain. Tolerating soft diet. Had BM.   Assessment  & Plan :    Principal Problem:   Crohn's colitis, with abscess ([place]) Active Problems:   Crohn's colitis ([place])   Leukocytosis   Anemia   Abnormal CT of the abdomen   Crohn's disease of ileum with abscess ([place])   Feeding difficulties   Crohn's Terminal ileitis, with Escherichia coli abscess - Patient with known history of Crohn's disease, CT abdomen pelvis significant for Crohn's colitis with abscess. - GI  was consulted. Imuran was discontinued. As per GI, he is going to need biological therapy when these acute abscesses resolve. Hepatitis B surface antibody: 4.1 (inconsistent with immunity), QuantiFERON TB gold 03/16/16: Indeterminate. Hepatitis B surface antigen: Negative. As discussed with GI, may need hepatitis immunization and repeating QuantiFERON TB gold or PPD prior to biological. - As per IR,  initially his abscesses were not amenable to percutaneous drainage. He is being treated with bowel rest/nothing by mouth, IV Zosyn, TPN. - GI input greatly appreciated, continue nothing by mouth, bowel rest, and IV Zosyn (since 03/15/16), anticipated prolonged course of bowel rest,PICC line inserted 1/20, and started on TPN. Surgeons were consulted and followed along. He indicated that if no resolution with conservative measures, may require surgical intervention for drainage and resection of terminal ileum and cecum. - Reimaged with CT abdomen on 03/20/16 which showed abscesses had enlarged. IR was re consulted for percutaneous drainage. - IR placed CT-guided drain on 03/21/16. Abscess culture shows Escherichia coli. As per GI follow-up, this was the second time that the patient has had drainage of Crohn's related abscess and when infection is optimally controlled, they think that the next step is ileal resection followed by outpatient GI management of his Crohn's after recovering from surgery. Surgery most likely will be electively and as outpatient. Tolerated full liquids. Plan is to continue TNA until we are sure that he is tolerating adequate diet. Overall clinically improved. Not much drainage from RLQ percutaneous drain. Changed antibiotics to IV ceftriaxone 1/27. Ordered CT Abdomen and Pelvis with contrast for 1/29, to reassess.  Anemia - Reasonably stable since admission and has been in the low 9 g per DL range for the last several days. Likely anemia of chronic disease.   Thrombocytosis - likely reactive  . Resolved.     Code Status : Full  Family Communication  : None at bedside  Disposition Plan  : home when stable  Consults  :  GI, Gen surgery, IR  Procedures  : CT-guided drain catheter placement into abscess by IR on 1/24., R upper arm PICC line.  DVT Prophylaxis  :  Lovenox -SCDs   Lab Results  Component Value Date   PLT 382 03/21/2016    Antibiotics  :    Anti-infectives    Start     Dose/Rate Route Frequency Ordered Stop   03/24/16 1200  cefTRIAXone (ROCEPHIN) 2 g in dextrose 5 % 50 mL IVPB     2 g 100 mL/hr over 30 Minutes Intravenous Every 24 hours 03/24/16 1133     03/16/16 1400  piperacillin-tazobactam (ZOSYN) IVPB 3.375 g  Status:  Discontinued     3.375 g 12.5 mL/hr over 240 Minutes Intravenous Every 8 hours 03/16/16 0548 03/24/16 1133   03/16/16 0415  piperacillin-tazobactam (ZOSYN) IVPB 3.375 g     3.375 g 12.5 mL/hr over 240 Minutes Intravenous  Once 03/16/16 0411 03/16/16 0844        Objective:   Vitals:   03/23/16 2036 03/24/16 1343 03/24/16 2206 03/25/16 0556  BP: 98/60 111/67 (!) 102/54 (!) 100/55  Pulse: 87 80 89 86  Resp: 14 16 16 16   Temp: 97.6 F (36.4 C) 98 F (36.7 C) 97.4 F (36.3 C) 98.1 F (36.7 C)  TempSrc: Oral Oral Oral Oral  SpO2: 97% 99% 99% 97%  Weight:      Height:        Wt Readings from Last 3 Encounters:  03/16/16 78.5 kg (173 lb)     Intake/Output Summary (Last 24 hours) at 03/25/16 1053 Last data filed at 03/25/16 0600  Gross per 24 hour  Intake              485 ml  Output              640 ml  Net             -  155 ml     Physical Exam  Gen. exam: Awake Alert, Oriented X 3,  Neck: Supple Neck,No JVD,  Respiratory system: Symmetrical Chest wall movement, Good air movement bilaterally, CTAB CVS: RRR,No Gallops,Rubs or new Murmurs, No Parasternal Heave  GI: Nondistended and soft. RLQ drain with small amount of bloody drainage. No tenderness or peritoneal signs. Normal bowel sounds heard. Extremities: No  Cyanosis, Clubbing or edema, No new Rash or bruise   CNS: Alert and oriented. No focal deficits.    Data Review:    CBC  Recent Labs Lab 03/19/16 0530 03/20/16 0534 03/21/16 0542 03/21/16 0700  WBC 10.8* 12.7* QUESTIONABLE RESULTS, RECOMMEND RECOLLECT TO VERIFY 12.0*  HGB 9.2* 9.2* QUESTIONABLE RESULTS, RECOMMEND RECOLLECT TO VERIFY 9.2*  HCT 29.4* 29.1* QUESTIONABLE RESULTS, RECOMMEND RECOLLECT TO VERIFY 29.0*  PLT 411* 368 QUESTIONABLE RESULTS, RECOMMEND RECOLLECT TO VERIFY 382  MCV 80.1 81.3 QUESTIONABLE RESULTS, RECOMMEND RECOLLECT TO VERIFY 79.2  MCH 25.1* 25.7* QUESTIONABLE RESULTS, RECOMMEND RECOLLECT TO VERIFY 25.1*  MCHC 31.3 31.6 QUESTIONABLE RESULTS, RECOMMEND RECOLLECT TO VERIFY 31.7  RDW 14.0 14.1 QUESTIONABLE RESULTS, RECOMMEND RECOLLECT TO VERIFY 13.7    Chemistries   Recent Labs Lab 03/19/16 0530 03/20/16 0534 03/21/16 0700 03/22/16 0822  NA 138 138 134* 135  K 3.6 3.7 3.7 4.2  CL 106 107 104 103  CO2 27 24 23 26   GLUCOSE 107* 124* 125* 141*  BUN 11 12 12 12   CREATININE 0.74 0.71 0.77 0.56*  CALCIUM 8.5* 8.4* 8.2* 8.6*  MG 2.0 2.0  --  2.1  AST 12*  --   --  16  ALT 8*  --   --  11*  ALKPHOS 30*  --   --  35*  BILITOT 0.4  --   --  0.4   ------------------------------------------------------------------------------------------------------------------ No results for input(s): CHOL, HDL, LDLCALC, TRIG, CHOLHDL, LDLDIRECT in the last 72 hours.  No results found for: HGBA1C ------------------------------------------------------------------------------------------------------------------ No results for input(s): TSH, T4TOTAL, T3FREE, THYROIDAB in the last 72 hours.  Invalid input(s): FREET3 ------------------------------------------------------------------------------------------------------------------ No results for input(s): VITAMINB12, FOLATE, FERRITIN, TIBC, IRON, RETICCTPCT in the last 72 hours.  Coagulation profile  Recent Labs Lab  03/21/16 0542 03/21/16 0700  INR NOT CALCULATED 1.33    No results for input(s): DDIMER in the last 72 hours.  Cardiac Enzymes No results for input(s): CKMB, TROPONINI, MYOGLOBIN in the last 168 hours.  Invalid input(s): CK ------------------------------------------------------------------------------------------------------------------ No results found for: BNP  Inpatient Medications  Scheduled Meds: . cefTRIAXone (ROCEPHIN)  IV  2 g Intravenous Q24H  . enoxaparin (LOVENOX) injection  40 mg Subcutaneous Q24H  . insulin aspart  0-9 Units Subcutaneous Q8H  . multivitamin with minerals  1 tablet Oral Daily  . pantoprazole  40 mg Oral Daily  . saccharomyces boulardii  250 mg Oral BID  . sodium chloride flush  10-40 mL Intracatheter Q12H   Continuous Infusions: . sodium chloride 20 mL/hr at 03/25/16 0612  . Marland KitchenTPN (CLINIMIX-E) Adult     And  . fat emulsion    . Marland KitchenTPN (CLINIMIX-E) Adult 80 mL/hr at 03/24/16 1730   PRN Meds:.acetaminophen, albuterol, iopamidol, iopamidol, morphine injection, ondansetron **OR** ondansetron (ZOFRAN) IV, sodium chloride flush  Micro Results Recent Results (from the past 240 hour(s))  Aerobic/Anaerobic Culture (surgical/deep wound)     Status: None (Preliminary result)   Collection Time: 03/21/16  5:08 PM  Result Value Ref Range Status   Specimen Description ABSCESS ABDOMEN  Final   Special Requests NONE  Final  Gram Stain   Final    ABUNDANT WBC PRESENT, PREDOMINANTLY PMN NO ORGANISMS SEEN Performed at [facility], [place] [address], [place], [place] [address]    Culture   Final    RARE ESCHERICHIA COLI CULTURE REINCUBATED FOR BETTER GROWTH NO ANAEROBES ISOLATED; CULTURE IN PROGRESS FOR 5 DAYS    Report Status PENDING  Incomplete   Organism ID, Bacteria ESCHERICHIA COLI  Final      Susceptibility   Escherichia coli - MIC*    AMPICILLIN >=32 RESISTANT Resistant     CEFAZOLIN <=4 SENSITIVE Sensitive     CEFEPIME <=1 SENSITIVE  Sensitive     CEFTAZIDIME <=1 SENSITIVE Sensitive     CEFTRIAXONE <=1 SENSITIVE Sensitive     CIPROFLOXACIN >=4 RESISTANT Resistant     GENTAMICIN <=1 SENSITIVE Sensitive     IMIPENEM <=0.25 SENSITIVE Sensitive     TRIMETH/SULFA <=20 SENSITIVE Sensitive     AMPICILLIN/SULBACTAM 16 INTERMEDIATE Intermediate     PIP/TAZO <=4 SENSITIVE Sensitive     Extended ESBL NEGATIVE Sensitive     * RARE ESCHERICHIA COLI    Radiology Reports Ct Abdomen Pelvis W Contrast  Result Date: 03/20/2016 CLINICAL DATA:  Right lower quadrant abdominal pain. History of Crohn' s disease. EXAM: CT ABDOMEN AND PELVIS WITH CONTRAST TECHNIQUE: Multidetector CT imaging of the abdomen and pelvis was performed using the standard protocol following bolus administration of intravenous contrast. CONTRAST:  35m ISOVUE-300 IOPAMIDOL (ISOVUE-300) INJECTION 61% COMPARISON:  03/16/2016 CT scan FINDINGS: Lower chest: Linear subsegmental atelectasis in the posterior basal segment right lower lobe. Density in the cavoatrial junction is probably a central line, correlate with patient history. Hepatobiliary: Unremarkable Pancreas: Unremarkable Spleen: Unremarkable Adrenals/Urinary Tract: Unremarkable Stomach/Bowel: Similar to prior there is a 14 cm region of severe wall thickening in the terminal ileum with abscess tracking along the right transverse abdominis muscle in the right retroperitoneum to involve the right psoas muscle, volume of abscess approximately 80 cc and containing gas and fluid with thick enhancing margins, this is increased in size compared to the prior exam. On images 56-57 of series 2 there small locules of extraluminal gas medial to the terminal ileum compatible with contained perforation or small microabscesses. Air-fluid level in the contrast in the rectum. Vascular/Lymphatic: Small reactive right inguinal and pelvic lymph nodes. Reproductive: Unremarkable Other: No supplemental non-categorized findings. Musculoskeletal:  Sacroiliac joints unremarkable. Partially calcified annulus fibrosis along a disc bulge at L5-S1. IMPRESSION: 1. Severe wall thickening in the distal 14 cm of the terminal ileum, with small amounts of adjacent extraluminal loculated gas containing compatible with micro perforation, and with enlargement of the right transverse abdominis and psoas muscle abscess which is approximately 80 cc in volume. Electronically Signed   By: WVan ClinesM.D.   On: 03/20/2016 12:12   Ct Abdomen Pelvis W Contrast  Result Date: 03/16/2016 CLINICAL DATA:  Abdominal pain. Right lower quadrant pain. Weight loss. Crohn's disease. EXAM: CT ABDOMEN AND PELVIS WITH CONTRAST TECHNIQUE: Multidetector CT imaging of the abdomen and pelvis was performed using the standard protocol following bolus administration of intravenous contrast. CONTRAST:  1047mISOVUE-300 IOPAMIDOL (ISOVUE-300) INJECTION 61% [facility]  [facility]: The lung bases are clear. Hepatobiliary: No focal liver abnormality is seen. No gallstones, gallbladder wall thickening, or biliary dilatation. Pancreas: No ductal dilatation or inflammation. Spleen: Normal in size without focal abnormality. Adrenals/Urinary Tract: Normal adrenal glands. Symmetric renal enhancement without hydronephrosis. Urinary bladder is physiologically distended.  Stomach/Bowel: Wall thickening involving the terminal ileum and cecum with moderate adjacent mesenteric inflammation consistent with active Crohn's. There multiple adjacent irregular fluid collections with peripheral enhancement in the right lower quadrant consistent with abscess. These involve both the adjacent fat and right iliopsoas muscle. A discrete collection anteriorly measures 2.8 x 1.7 cm. This may be contiguous with the more posterior collection measuring 2.5 x 4.7 cm, which involves the iliopsoas muscle. The appendix is tentatively identified containing  intraluminal fluid with similar stranding to the adjacent inflamed cecum and ascending colon. No other bowel wall thickening to suggest additional sites of active Crohn's. Moderate stool in the more distal colon. Vascular/Lymphatic: No significant vascular findings are present. No enlarged abdominal or pelvic lymph nodes. Reproductive: Prostate is unremarkable. Other: Right lower quadrant abscesses as described. No pelvic free fluid. No free air. Musculoskeletal: Heterogeneous edema of the right iliopsoas muscle with small intramuscular abscess as described. Mild heterogeneous enlargement of the distal iliopsoas likely myositis. There are no acute or suspicious osseous abnormalities. IMPRESSION: Active Crohn's involving the terminal ileum and cecum. There are adjacent irregular abscesses in the right lower quadrant involving the adjacent fat and iliopsoas muscle. Fluid collections are irregular in shape, and measure approximately 2.8 and 4.7 cm respectively. Electronically Signed   By: Jeb Levering M.D.   On: 03/16/2016 03:24   Ct Image Guided Drainage By Percutaneous Catheter  Result Date: 03/21/2016 INDICATION: Right lower quadrant abscess EXAM: CT GUIDED DRAINAGE OF A RIGHT LOWER QUADRANT ABSCESS MEDICATIONS: The patient is currently admitted to the hospital and receiving intravenous antibiotics. The antibiotics were administered within an appropriate time frame prior to the initiation of the procedure. ANESTHESIA/SEDATION: mg IV Versed  mcg IV Fentanyl Moderate Sedation Time: The patient was continuously monitored during the procedure by the interventional radiology nurse under my direct supervision. COMPLICATIONS: None immediate. TECHNIQUE: Informed written consent was obtained from the patient after a thorough discussion of the procedural risks, benefits and alternatives. All questions were addressed. Maximal Sterile Barrier Technique was utilized including caps, mask, sterile gowns, sterile gloves,  sterile drape, hand hygiene and skin antiseptic. A timeout was performed prior to the initiation of the procedure. PROCEDURE: The right lower quadrant was prepped with ChloraPrep in a sterile fashion, and a sterile drape was applied covering the operative field. A sterile gown and sterile gloves were used for the procedure. Local anesthesia was provided with 1% Lidocaine. Under CT guidance, an 18 gauge needle was inserted into the right lower quadrant abscess. It was removed over an Amplatz wire. Twelve [place] dilator followed by a 12 [place] drain were inserted. The drain was looped and string fixed in the fluid collection. 30 cc frank pus was aspirated. It was sewn to the skin. FINDINGS: Imaging documents placement of a 24 French right lower quadrant abscess drain. IMPRESSION: Successful CT-guided right lower quadrant abscess drain. Electronically Signed   By: Marybelle Killings M.D.   On: 03/21/2016 17:09     Payson Crumby, MD, FACP, FHM. [facility] Pager [phone]  If 7PM-7AM, please contact night-coverage [email or web address] Password [facility] 03/25/2016, 10:53 AM

## 2016-03-26 ENCOUNTER — Other Ambulatory Visit: Payer: Self-pay | Admitting: Radiology

## 2016-03-26 ENCOUNTER — Encounter (HOSPITAL_COMMUNITY): Payer: Self-pay | Admitting: Radiology

## 2016-03-26 ENCOUNTER — Inpatient Hospital Stay (HOSPITAL_COMMUNITY): Payer: BLUE CROSS/BLUE SHIELD

## 2016-03-26 DIAGNOSIS — K651 Peritoneal abscess: Secondary | ICD-10-CM

## 2016-03-26 DIAGNOSIS — IMO0002 Reserved for concepts with insufficient information to code with codable children: Secondary | ICD-10-CM

## 2016-03-26 LAB — COMPREHENSIVE METABOLIC PANEL
ALT: 20 U/L (ref 17–63)
ANION GAP: 5 (ref 5–15)
AST: 18 U/L (ref 15–41)
Albumin: 2.9 g/dL — ABNORMAL LOW (ref 3.5–5.0)
Alkaline Phosphatase: 46 U/L (ref 38–126)
BILIRUBIN TOTAL: 0.2 mg/dL — AB (ref 0.3–1.2)
BUN: 13 mg/dL (ref 6–20)
CALCIUM: 8.8 mg/dL — AB (ref 8.9–10.3)
CO2: 27 mmol/L (ref 22–32)
Chloride: 107 mmol/L (ref 101–111)
Creatinine, Ser: 0.8 mg/dL (ref 0.61–1.24)
Glucose, Bld: 134 mg/dL — ABNORMAL HIGH (ref 65–99)
POTASSIUM: 3.9 mmol/L (ref 3.5–5.1)
Sodium: 139 mmol/L (ref 135–145)
TOTAL PROTEIN: 7.5 g/dL (ref 6.5–8.1)

## 2016-03-26 LAB — CBC
HCT: 32.2 % — ABNORMAL LOW (ref 39.0–52.0)
Hemoglobin: 10.1 g/dL — ABNORMAL LOW (ref 13.0–17.0)
MCH: 25.7 pg — ABNORMAL LOW (ref 26.0–34.0)
MCHC: 31.4 g/dL (ref 30.0–36.0)
MCV: 81.9 fL (ref 78.0–100.0)
PLATELETS: 381 10*3/uL (ref 150–400)
RBC: 3.93 MIL/uL — ABNORMAL LOW (ref 4.22–5.81)
RDW: 14.3 % (ref 11.5–15.5)
WBC: 10.2 10*3/uL (ref 4.0–10.5)

## 2016-03-26 LAB — DIFFERENTIAL
BASOS PCT: 0 %
Basophils Absolute: 0 10*3/uL (ref 0.0–0.1)
EOS PCT: 3 %
Eosinophils Absolute: 0.3 10*3/uL (ref 0.0–0.7)
Lymphocytes Relative: 11 %
Lymphs Abs: 1.2 10*3/uL (ref 0.7–4.0)
MONO ABS: 0.9 10*3/uL (ref 0.1–1.0)
Monocytes Relative: 9 %
NEUTROS ABS: 7.8 10*3/uL — AB (ref 1.7–7.7)
NEUTROS PCT: 77 %

## 2016-03-26 LAB — AEROBIC/ANAEROBIC CULTURE (SURGICAL/DEEP WOUND)

## 2016-03-26 LAB — AEROBIC/ANAEROBIC CULTURE W GRAM STAIN (SURGICAL/DEEP WOUND)

## 2016-03-26 LAB — PREALBUMIN: PREALBUMIN: 22.5 mg/dL (ref 18–38)

## 2016-03-26 LAB — PHOSPHORUS: Phosphorus: 3.5 mg/dL (ref 2.5–4.6)

## 2016-03-26 LAB — GLUCOSE, CAPILLARY
Glucose-Capillary: 122 mg/dL — ABNORMAL HIGH (ref 65–99)
Glucose-Capillary: 100 mg/dL — ABNORMAL HIGH (ref 65–99)

## 2016-03-26 LAB — TRIGLYCERIDES: TRIGLYCERIDES: 103 mg/dL (ref <150)

## 2016-03-26 LAB — MAGNESIUM: MAGNESIUM: 1.9 mg/dL (ref 1.7–2.4)

## 2016-03-26 MED ORDER — IOPAMIDOL (ISOVUE-300) INJECTION 61%: 100.0000 mL | Freq: Once | INTRAVENOUS | Status: DC | PRN

## 2016-03-26 NOTE — Progress Notes (Signed)
Referring Physician(s): Connor,C  Supervising Physician: Aletta Edouard  Patient Status:  [name] - In-pt  Chief Complaint:  Abdominal abscess  Subjective:  Pt doing well; no acute c/o  Allergies: Patient has no known allergies.  Medications: Prior to Admission medications   Medication Sig Start Date End Date Taking? Authorizing Provider  acetaminophen (TYLENOL) 500 MG tablet Take 1,000 mg by mouth every 6 (six) hours as needed for moderate pain.   Yes Historical Provider, MD  azaTHIOprine (IMURAN) 50 MG tablet Take 2 tablets by mouth daily. 02/18/16  Yes Historical Provider, MD  predniSONE (DELTASONE) 10 MG tablet Take 5 mg by mouth daily.  03/02/16  Yes Historical Provider, MD     Vital Signs: BP (!) 110/59 (BP Location: Left Arm)   Pulse 88   Temp 98.2 F (36.8 C) (Oral)   Resp 17   Ht 6' (1.829 m)   Wt 173 lb (78.5 kg)   SpO2 99%   BMI 23.46 kg/m   Physical Exam RLQ drain intact, insertion site ok, output 40 cc turbid, beige colored fluid; cx- e coli, saccharomyces cerevisiae, drain flushed with sterile NS  Imaging: Ct Abdomen Pelvis W Contrast  Result Date: 03/26/2016 CLINICAL DATA:  Status post drain placement 03/21/2016 for right lower quadrant abscess. Crohn colitis. EXAM: CT ABDOMEN AND PELVIS WITH CONTRAST TECHNIQUE: Multidetector CT imaging of the abdomen and pelvis was performed using the standard protocol following bolus administration of intravenous contrast. CONTRAST:  118m ISOVUE-300 IOPAMIDOL (ISOVUE-300) INJECTION 61% COMPARISON:  03/20/2016 FINDINGS: Lower chest: Clear lung bases. Normal heart size without pericardial or pleural effusion. An incompletely image central line. Hepatobiliary: Normal liver. Normal gallbladder, without biliary ductal dilatation. Pancreas: Normal, without mass or ductal dilatation. Spleen: Normal in size, without focal abnormality. Adrenals/Urinary Tract: Normal adrenal glands. Normal kidneys, without hydronephrosis. Normal  urinary bladder. Stomach/Bowel: Normal stomach, without wall thickening. Normal colon. Terminal and distal ileal wall thickening is again identified. Felt to be slightly improved. The appendix is dilated and hyperenhancing including on image 56/series 2. This is chronic and favored to be secondary. fluid and gas collection medial to the terminal ileum measures 1.6 cm on image 61/series 2. A more inferior and anterior collection a 2.0 cm on image 67/series 2. These are new. More lateral fluid collections are decreased in size, status post drain placement. Collection along the right psoas muscle is nearly completely resolved, measuring 2.1 cm maximally on image 73/series 2. Compare 5.2 x 3.5 cm on the prior exam (when remeasured). More anterior right-sided collection measures 2.0 cm on image 71/series 2 versus 3.0 cm on the prior exam (when remeasured). More proximal small bowel loops are normal in caliber. Vascular/Lymphatic: Normal caliber of the aorta and branch vessels. No abdominopelvic adenopathy. Reproductive: Normal prostate. Other: No significant free fluid. Musculoskeletal: No acute osseous abnormality. IMPRESSION: 1. Terminal ileitis, likely mildly improved. Interval decrease in size of lateral right lower quadrant abscesses, status post drain placement. 2. Developing smaller fluid collections medially, suspicious for mesenteric abscesses. Electronically Signed   By: KAbigail MiyamotoM.D.   On: 03/26/2016 12:07    Labs:  CBC:  Recent Labs  03/20/16 0534 03/21/16 0542 03/21/16 0700 03/26/16 0844  WBC 12.7* QUESTIONABLE RESULTS, RECOMMEND RECOLLECT TO VERIFY 12.0* 10.2  HGB 9.2* QUESTIONABLE RESULTS, RECOMMEND RECOLLECT TO VERIFY 9.2* 10.1*  HCT 29.1* QUESTIONABLE RESULTS, RECOMMEND RECOLLECT TO VERIFY 29.0* 32.2*  PLT 368 QUESTIONABLE RESULTS, RECOMMEND RECOLLECT TO VERIFY 382 381    COAGS:  Recent Labs  03/21/16 0542 03/21/16 0700  INR NOT CALCULATED 1.33    BMP:  Recent Labs   03/20/16 0534 03/21/16 0700 03/22/16 0822 03/26/16 0844  NA 138 134* 135 139  K 3.7 3.7 4.2 3.9  CL 107 104 103 107  CO2 24 23 26 27   GLUCOSE 124* 125* 141* 134*  BUN 12 12 12 13   CALCIUM 8.4* 8.2* 8.6* 8.8*  CREATININE 0.71 0.77 0.56* 0.80  GFRNONAA >60 >60 >60 >60  GFRAA >60 >60 >60 >60    LIVER FUNCTION TESTS:  Recent Labs  03/18/16 0800 03/19/16 0530 03/22/16 0822 03/26/16 0844  BILITOT 0.6 0.4 0.4 0.2*  AST 11* 12* 16 18  ALT 9* 8* 11* 20  ALKPHOS 31* 30* 35* 46  PROT 6.6 7.0 7.8 7.5  ALBUMIN 2.7* 2.9* 3.0* 2.9*    Assessment and Plan:  Chron's colitis; s/p RLQ abscess drainage 1/24; AF; WBC nl; antbx per pharmacy; latest CT shows improvement in size but unresolved abd abscess; cont drain for now; if pt d/c'd home with drain rec once daily flush with 5- 10 cc sterile NS, recording of output and dressing changes every 1-2 days; will schedule for f/u in IR drain clinic late next week   Electronically Signed: D. Rowe Robert 03/26/2016, 2:36 PM   I spent a total of 15 minutes at the the patient's bedside AND on the patient's hospital floor or unit, greater than 50% of which was counseling/coordinating care for abdominal abscess    Patient ID: Larry [name], male   DOB: May 27, 1980, 36 y.o.   MRN: [id number]

## 2016-03-26 NOTE — Progress Notes (Signed)
    Progress Note   Subjective  Chief Complaint: Crohn's with recurrent abscess status post drainage procedure  Patient continues to do well this morning, tolerating diet, no new complaints. He does request to be seen in our office after time of discharge so he can get a work note saying that he has no restrictions so he is able to return to work. 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 He will also need his FMLA paperwork to be finished, the hospitalist that he would "do his part".    Objective   Vital signs in last 24 hours: Temp:  [98 F (36.7 C)-98.2 F (36.8 C)] 98.2 F (36.8 C) (01/29 0623) Pulse Rate:  [83-88] 88 (01/29 0623) Resp:  [16-17] 17 (01/29 0623) BP: (99-110)/(53-59) 110/59 (01/29 0623) SpO2:  [98 %-99 %] 99 % (01/29 0623) Last BM Date: 03/23/16 General:   African American male in NAD Heart:  Regular rate and rhythm; no murmurs Lungs: Respirations even and unlabored, lungs CTA bilaterally Abdomen:  Soft, nontender and nondistended. Normal bowel sounds. drain in place Extremities:  Without edema. Neurologic:  Alert and oriented,  grossly normal neurologically. Psych:  Cooperative. Normal mood and affect.  Intake/Output from previous day: 01/28 0701 - 01/29 0700 In: 2497 [I.V.:2377; IV Piggyback:100] Out: 590 [Urine:550; Drains:40]  Lab Results:  Recent Labs  03/26/16 0844  WBC 10.2  HGB 10.1*  HCT 32.2*  PLT 381   BMET  Recent Labs  03/26/16 0844  NA 139  K 3.9  CL 107  CO2 27  GLUCOSE 134*  BUN 13  CREATININE 0.80  CALCIUM 8.8*   LFT  Recent Labs  03/26/16 0844  PROT 7.5  ALBUMIN 2.9*  AST 18  ALT 20  ALKPHOS 46  BILITOT 0.2*      Assessment / Plan:    Assessment: 1. Ileal Crohn's complicated by abscess status post percutaneous drain placement  Plan: 1. Awaiting results of CT abdomen/pelvis ordered this morning 2. Continue to advance diet as tolerated 3. Continue antibiotics 4. Discussed with the patient that we could see  him in clinic once he is discharged and will be able to write a work note for him as soon as he is able to return to work with no restrictions, we can also finish his FMLA paperwork 5. Please await any further recommendations from Dr. Fuller Plan who will be seeing the patient later this afternoon  Thank you for kind consultation, we will continue to follow   LOS: 10 days   Levin Erp  03/26/2016, 10:06 AM  Pager # [phone]    Attending physician's note   I have taken an interval history, reviewed the chart and examined the patient. I agree with the Advanced Practitioner's note, impression and recommendations. Continue current mgmt. Review CT findings with IR and surgery. If abscess improving consider discharge on PO antibiotics.   Lucio Edward, MD [facility] [phone] Mon-Fri 8a-5p [phone] after 5p, weekends, holidays

## 2016-03-26 NOTE — Progress Notes (Signed)
PHARMACY - ADULT TOTAL PARENTERAL NUTRITION CONSULT NOTE   Pharmacy Consult for TPN Indication: Crohn's disease on bowel rest with multiple abscesses   Patient Measurements: Height: 6' (182.9 cm) Weight: 173 lb (78.5 kg) IBW/kg (Calculated) : 77.6 TPN AdjBW (KG): 78.5 Body mass index is 23.46 kg/m. Usual Weight: 95kg (7 mo ago)  Insulin Requirements: no SSI required/24h  Current Nutrition: full liquid diet started 1/27  IVF: NS at 20 ml/hr  Central access: PICC placed 1/20 TPN start date: 1/20  ASSESSMENT                                                                                                     HPI:  79 YOM with history of Crohn's disease presents 1/19 to [facility] ED with c/o abd pain.  CT reveals active Crohn's and RLQ abscesses and fluid collection. These are not amendable to IR drainage.  GI has stated it was reasonable to start TPN since will be NPO and will have follow- scans week of 1/22 to determine if will need surgical intervention  Significant events:  1/23: CTscan abd/pelvis = severe wall thickening of terminal ileum w/ small amount loculated gas c/w microperforation and enlargement of transverse abdominis and psoas muscle abscess 1/24: IR drain placement for RLQ abscess  Today, 03/26/2016:   Glucose (goal <150) - no PMH of DM. CBGs controlled with minimal SSI use  Electrolytes - Mag at 1.9 (lower end of goal range), other lytes WNL including corrected Calcium  Renal -WNL  LFTs - wnl  TGs - WNL 98 (1/21)  Prealbumin - 13.6 (1/21)  NUTRITIONAL GOALS                                                                                             RD recs: 2125 - 2480 kcals, 95-110 gm protein Clinimix 5/20 at a goal rate of 58m/hr + 20% fat emulsion at 246mhr over 12h to provide: 96g/day protein, 2169Kcal/day.  All Clinimix products are currently on backorder.  Pharmacy will use Clinimix product based on what we have available in stock that will most closely  match patient's needs.  Formulation changes will be communicated in progress notes.  PLAN  Patient reported that he ate 100% of lunch and dinner on 1/28.  Per Ellouise Newer (PA), ok to wean and d/c TPN today  Titrate TPN rate down by half to 40 ml/hr foe 2 hours, then d/c TPN  Dia Sitter, PharmD, BCPS 03/26/2016 7:56 AM

## 2016-03-26 NOTE — Progress Notes (Signed)
Patient ID: Larry [name], male   DOB: 1981/01/05, 36 y.o.   MRN: [id number]     Subjective: No C/O, tol soft diet well  Objective: Vital signs in last 24 hours: Temp:  [98 F (36.7 C)-98.2 F (36.8 C)] 98.2 F (36.8 C) (01/29 0623) Pulse Rate:  [83-88] 88 (01/29 0623) Resp:  [16-17] 17 (01/29 0623) BP: (99-110)/(53-59) 110/59 (01/29 0623) SpO2:  [98 %-99 %] 99 % (01/29 0623) Last BM Date: 03/23/16  Intake/Output from previous day: 01/28 0701 - 01/29 0700 In: 2497 [I.V.:2377; IV Piggyback:100] Out: 590 [Urine:550; Drains:40] Intake/Output this shift: No intake/output data recorded.  General appearance: alert, cooperative and no distress GI: normal findings: soft, non-tender and Drain with slight purulent/bloody drainage  Lab Results:  No results for input(s): WBC, HGB, HCT, PLT in the last 72 hours. BMET No results for input(s): NA, K, CL, CO2, GLUCOSE, BUN, CREATININE, CALCIUM in the last 72 hours.   Studies/Results: No results found.  Anti-infectives: Anti-infectives    Start     Dose/Rate Route Frequency Ordered Stop   03/24/16 1200  cefTRIAXone (ROCEPHIN) 2 g in dextrose 5 % 50 mL IVPB     2 g 100 mL/hr over 30 Minutes Intravenous Every 24 hours 03/24/16 1133     03/16/16 1400  piperacillin-tazobactam (ZOSYN) IVPB 3.375 g  Status:  Discontinued     3.375 g 12.5 mL/hr over 240 Minutes Intravenous Every 8 hours 03/16/16 0548 03/24/16 1133   03/16/16 0415  piperacillin-tazobactam (ZOSYN) IVPB 3.375 g     3.375 g 12.5 mL/hr over 240 Minutes Intravenous  Once 03/16/16 0411 03/16/16 0844      Assessment/Plan: Crohn's Dz with psoas abscess, S/P perc drainage.  Steady improvement.  Tolerating diet. Cont current Rx. Repeat CT today. Dispo pending CT- if improved on CT he can potentially be transitioned to PO abx and DC    LOS: 10 days    Larry [name] 03/26/2016

## 2016-03-26 NOTE — Progress Notes (Signed)
Patient ID: Larry [name], male   DOB: 01/30/1981, 36 y.o.   MRN: [id number] Additional mesenteric fluid collections noted on pt's CT today are very small and do not warrant drainage at this time. Case reviewed by Dr. Kathlene Cote. Pt will undergo f/u CT at our drain clinic latter part of next week or week following.

## 2016-03-26 NOTE — Progress Notes (Signed)
PROGRESS NOTE                                                                                                                                                                                                             Patient Demographics:    Larry [name], is a 36 y.o. male, DOB - [birth year], VVO:[id number]  Admit date - 03/16/2016   Admitting Physician Norval Morton, MD  Outpatient Primary MD for the patient is Pcp Not In System  LOS - 10   Chief Complaint  Patient presents with  . Abdominal Pain       Brief Narrative   36 year old male with PMH of Crohn's colitis on azathioprine, additionally diagnosed in 2015 when he had presented with abscesses that required drainage. He followed up with Dr. Britta Mccreedy in [place] who apparently did colonoscopy and was reportedly normal. He has been on Imuran 100 MG daily since that time along with prednisone (since August when placed on prednisone for a flare by Dr. Britta Mccreedy). Prednisone was tapered to 5 mg daily prior to this admission. Dr. Britta Mccreedy has relocated and patient has not seen him back since August. He now presented with right lower quadrant abdominal pain, hard stool every 3 days or so without rectal bleeding or diarrhea, reduced appetite and weight loss. Initial CT abdomen showed active Crohn's colitis with abscess collections in the right lower quadrant involving iliopsoas muscle.    Subjective:    Alvy Alsop continues to progress well. Denies pain. Tolerating soft diet. Had BM.   Assessment  & Plan :    Principal Problem:   Crohn's colitis, with abscess ([place]) Active Problems:   Crohn's colitis ([place])   Leukocytosis   Anemia   Abnormal CT of the abdomen   Crohn's disease of ileum with abscess ([place])   Feeding difficulties   Crohn's Terminal ileitis, with Escherichia coli abscess - Patient with known history of Crohn's disease, CT abdomen pelvis significant for Crohn's colitis with abscess. -  GI was consulted. Imuran was discontinued. As per GI, he is going to need biological therapy when these acute abscesses resolve. Hepatitis B surface antibody: 4.1 (inconsistent with immunity), QuantiFERON TB gold 03/16/16: Indeterminate. Hepatitis B surface antigen: Negative. As discussed with GI, may need hepatitis immunization and repeating QuantiFERON TB gold or PPD prior to biological. - As per IR,  initially his abscesses were not amenable to percutaneous drainage. He is being treated with bowel rest/nothing by mouth, IV Zosyn, TPN. - GI input greatly appreciated, continue nothing by mouth, bowel rest, and IV Zosyn (since 03/15/16), anticipated prolonged course of bowel rest,PICC line inserted 1/20, and started on TPN. Surgeons were consulted and followed along. He indicated that if no resolution with conservative measures, may require surgical intervention for drainage and resection of terminal ileum and cecum. - Reimaged with CT abdomen on 03/20/16 which showed abscesses had enlarged. IR was re consulted for percutaneous drainage. - IR placed CT-guided drain on 03/21/16. Abscess culture shows Escherichia coli. As per GI follow-up, this was the second time that the patient has had drainage of Crohn's related abscess and when infection is optimally controlled, they think that the next step is ileal resection followed by outpatient GI management of his Crohn's after recovering from surgery. Surgery most likely will be electively and as outpatient. Tolerated full liquids. Plan is to continue TNA until we are sure that he is tolerating adequate diet. Overall clinically improved. Not much drainage from RLQ percutaneous drain. Changed antibiotics to IV ceftriaxone 1/27. Ordered CT Abdomen and Pelvis with contrast for 1/29, to reassess > pending. If CT shows improvement, consider stopping TNA, transitioning to oral antibiotics? Keflex, IR to advise decision regarding drain and then potential discharge in the next day  or so. Outpatient follow-up with [facility], general surgery and IR.  Anemia - Reasonably stable since admission and has been in the low 9 g per DL range for the last several days. Likely anemia of chronic disease.   Thrombocytosis - likely reactive . Resolved.     Code Status : Full  Family Communication  : None at bedside  Disposition Plan  : home when stable  Consults  :  GI, Gen surgery, IR  Procedures  : CT-guided drain catheter placement into abscess by IR on 1/24., R upper arm PICC line.  DVT Prophylaxis  :  Lovenox -SCDs   Lab Results  Component Value Date   PLT 381 03/26/2016    Antibiotics  :    Anti-infectives    Start     Dose/Rate Route Frequency Ordered Stop   03/24/16 1200  cefTRIAXone (ROCEPHIN) 2 g in dextrose 5 % 50 mL IVPB     2 g 100 mL/hr over 30 Minutes Intravenous Every 24 hours 03/24/16 1133     03/16/16 1400  piperacillin-tazobactam (ZOSYN) IVPB 3.375 g  Status:  Discontinued     3.375 g 12.5 mL/hr over 240 Minutes Intravenous Every 8 hours 03/16/16 0548 03/24/16 1133   03/16/16 0415  piperacillin-tazobactam (ZOSYN) IVPB 3.375 g     3.375 g 12.5 mL/hr over 240 Minutes Intravenous  Once 03/16/16 0411 03/16/16 0844        Objective:   Vitals:   03/25/16 0556 03/25/16 1340 03/25/16 2202 03/26/16 0623  BP: (!) 100/55 (!) 104/53 (!) 99/55 (!) 110/59  Pulse: 86 86 83 88  Resp: 16 16 16 17   Temp: 98.1 F (36.7 C) 98 F (36.7 C) 98 F (36.7 C) 98.2 F (36.8 C)  TempSrc: Oral Oral Oral Oral  SpO2: 97% 98% 98% 99%  Weight:      Height:        Wt Readings from Last 3 Encounters:  03/16/16 78.5 kg (173 lb)     Intake/Output Summary (Last 24 hours) at 03/26/16 1208 Last data filed at 03/26/16 7673  Gross per 24 hour  Intake             2497 ml  Output              590 ml  Net             1907 ml     Physical Exam  Gen. exam: Awake Alert, Oriented X 3,  Neck: Supple Neck,No JVD,  Respiratory system: Symmetrical Chest wall  movement, Good air movement bilaterally, CTAB CVS: RRR,No Gallops,Rubs or new Murmurs, No Parasternal Heave  GI: Nondistended and soft. RLQ drain with small amount of bloody drainage. No tenderness or peritoneal signs. Normal bowel sounds heard. Extremities: No Cyanosis, Clubbing or edema, No new Rash or bruise   CNS: Alert and oriented. No focal deficits.    Data Review:    CBC  Recent Labs Lab 03/20/16 0534 03/21/16 0542 03/21/16 0700 03/26/16 0844  WBC 12.7* QUESTIONABLE RESULTS, RECOMMEND RECOLLECT TO VERIFY 12.0* 10.2  HGB 9.2* QUESTIONABLE RESULTS, RECOMMEND RECOLLECT TO VERIFY 9.2* 10.1*  HCT 29.1* QUESTIONABLE RESULTS, RECOMMEND RECOLLECT TO VERIFY 29.0* 32.2*  PLT 368 QUESTIONABLE RESULTS, RECOMMEND RECOLLECT TO VERIFY 382 381  MCV 81.3 QUESTIONABLE RESULTS, RECOMMEND RECOLLECT TO VERIFY 79.2 81.9  MCH 25.7* QUESTIONABLE RESULTS, RECOMMEND RECOLLECT TO VERIFY 25.1* 25.7*  MCHC 31.6 QUESTIONABLE RESULTS, RECOMMEND RECOLLECT TO VERIFY 31.7 31.4  RDW 14.1 QUESTIONABLE RESULTS, RECOMMEND RECOLLECT TO VERIFY 13.7 14.3  LYMPHSABS  --   --   --  1.2  MONOABS  --   --   --  0.9  EOSABS  --   --   --  0.3  BASOSABS  --   --   --  0.0    Chemistries   Recent Labs Lab 03/20/16 0534 03/21/16 0700 03/22/16 0822 03/26/16 0844  NA 138 134* 135 139  K 3.7 3.7 4.2 3.9  CL 107 104 103 107  CO2 24 23 26 27   GLUCOSE 124* 125* 141* 134*  BUN 12 12 12 13   CREATININE 0.71 0.77 0.56* 0.80  CALCIUM 8.4* 8.2* 8.6* 8.8*  MG 2.0  --  2.1 1.9  AST  --   --  16 18  ALT  --   --  11* 20  ALKPHOS  --   --  35* 46  BILITOT  --   --  0.4 0.2*   ------------------------------------------------------------------------------------------------------------------  Recent Labs  03/26/16 0844  TRIG 103    No results found for: HGBA1C ------------------------------------------------------------------------------------------------------------------ No results for input(s): TSH, T4TOTAL,  T3FREE, THYROIDAB in the last 72 hours.  Invalid input(s): FREET3 ------------------------------------------------------------------------------------------------------------------ No results for input(s): VITAMINB12, FOLATE, FERRITIN, TIBC, IRON, RETICCTPCT in the last 72 hours.  Coagulation profile  Recent Labs Lab 03/21/16 0542 03/21/16 0700  INR NOT CALCULATED 1.33    No results for input(s): DDIMER in the last 72 hours.  Cardiac Enzymes No results for input(s): CKMB, TROPONINI, MYOGLOBIN in the last 168 hours.  Invalid input(s): CK ------------------------------------------------------------------------------------------------------------------ No results found for: BNP  Inpatient Medications  Scheduled Meds: . cefTRIAXone (ROCEPHIN)  IV  2 g Intravenous Q24H  . enoxaparin (LOVENOX) injection  40 mg Subcutaneous Q24H  . insulin aspart  0-9 Units Subcutaneous Q8H  . multivitamin with minerals  1 tablet Oral Daily  . pantoprazole  40 mg Oral Daily  . saccharomyces boulardii  250 mg Oral BID  . sodium chloride flush  10-40 mL Intracatheter Q12H   Continuous Infusions: . sodium chloride 20 mL/hr at 03/26/16 0534  . Marland KitchenTPN (CLINIMIX-E) Adult 80  mL/hr at 03/25/16 1819   PRN Meds:.acetaminophen, albuterol, iopamidol, morphine injection, ondansetron **OR** ondansetron (ZOFRAN) IV, sodium chloride flush  Micro Results Recent Results (from the past 240 hour(s))  Aerobic/Anaerobic Culture (surgical/deep wound)     Status: None (Preliminary result)   Collection Time: 03/21/16  5:08 PM  Result Value Ref Range Status   Specimen Description ABSCESS ABDOMEN  Final   Special Requests NONE  Final   Gram Stain   Final    ABUNDANT WBC PRESENT, PREDOMINANTLY PMN NO ORGANISMS SEEN Performed at [facility], [address], [place], [place] [address]    Culture   Final    RARE ESCHERICHIA COLI FEW YEAST IDENTIFICATION TO FOLLOW NO ANAEROBES ISOLATED; CULTURE IN PROGRESS FOR  5 DAYS    Report Status PENDING  Incomplete   Organism ID, Bacteria ESCHERICHIA COLI  Final      Susceptibility   Escherichia coli - MIC*    AMPICILLIN >=32 RESISTANT Resistant     CEFAZOLIN <=4 SENSITIVE Sensitive     CEFEPIME <=1 SENSITIVE Sensitive     CEFTAZIDIME <=1 SENSITIVE Sensitive     CEFTRIAXONE <=1 SENSITIVE Sensitive     CIPROFLOXACIN >=4 RESISTANT Resistant     GENTAMICIN <=1 SENSITIVE Sensitive     IMIPENEM <=0.25 SENSITIVE Sensitive     TRIMETH/SULFA <=20 SENSITIVE Sensitive     AMPICILLIN/SULBACTAM 16 INTERMEDIATE Intermediate     PIP/TAZO <=4 SENSITIVE Sensitive     Extended ESBL NEGATIVE Sensitive     * RARE ESCHERICHIA COLI    Radiology Reports Ct Abdomen Pelvis W Contrast  Result Date: 03/20/2016 CLINICAL DATA:  Right lower quadrant abdominal pain. History of Crohn' s disease. EXAM: CT ABDOMEN AND PELVIS WITH CONTRAST TECHNIQUE: Multidetector CT imaging of the abdomen and pelvis was performed using the standard protocol following bolus administration of intravenous contrast. CONTRAST:  41m ISOVUE-300 IOPAMIDOL (ISOVUE-300) INJECTION 61% COMPARISON:  03/16/2016 CT scan FINDINGS: Lower chest: Linear subsegmental atelectasis in the posterior basal segment right lower lobe. Density in the cavoatrial junction is probably a central line, correlate with patient history. Hepatobiliary: Unremarkable Pancreas: Unremarkable Spleen: Unremarkable Adrenals/Urinary Tract: Unremarkable Stomach/Bowel: Similar to prior there is a 14 cm region of severe wall thickening in the terminal ileum with abscess tracking along the right transverse abdominis muscle in the right retroperitoneum to involve the right psoas muscle, volume of abscess approximately 80 cc and containing gas and fluid with thick enhancing margins, this is increased in size compared to the prior exam. On images 56-57 of series 2 there small locules of extraluminal gas medial to the terminal ileum compatible with contained  perforation or small microabscesses. Air-fluid level in the contrast in the rectum. Vascular/Lymphatic: Small reactive right inguinal and pelvic lymph nodes. Reproductive: Unremarkable Other: No supplemental non-categorized findings. Musculoskeletal: Sacroiliac joints unremarkable. Partially calcified annulus fibrosis along a disc bulge at L5-S1. IMPRESSION: 1. Severe wall thickening in the distal 14 cm of the terminal ileum, with small amounts of adjacent extraluminal loculated gas containing compatible with micro perforation, and with enlargement of the right transverse abdominis and psoas muscle abscess which is approximately 80 cc in volume. Electronically Signed   By: WVan ClinesM.D.   On: 03/20/2016 12:12   Ct Abdomen Pelvis W Contrast  Result Date: 03/16/2016 CLINICAL DATA:  Abdominal pain. Right lower quadrant pain. Weight loss. Crohn's disease. EXAM: CT ABDOMEN AND PELVIS WITH CONTRAST TECHNIQUE: Multidetector CT imaging of the abdomen and pelvis was performed using the standard protocol following  bolus administration of intravenous contrast. CONTRAST:  190m ISOVUE-300 IOPAMIDOL (ISOVUE-300) INJECTION 61% COMPARISON:  [facility] FINDINGS: Lower chest: The lung bases are clear. Hepatobiliary: No focal liver abnormality is seen. No gallstones, gallbladder wall thickening, or biliary dilatation. Pancreas: No ductal dilatation or inflammation. Spleen: Normal in size without focal abnormality. Adrenals/Urinary Tract: Normal adrenal glands. Symmetric renal enhancement without hydronephrosis. Urinary bladder is physiologically distended. Stomach/Bowel: Wall thickening involving the terminal ileum and cecum with moderate adjacent mesenteric inflammation consistent with active Crohn's. There multiple adjacent irregular fluid collections with peripheral enhancement in the right lower quadrant consistent with abscess. These involve both the adjacent fat  and right iliopsoas muscle. A discrete collection anteriorly measures 2.8 x 1.7 cm. This may be contiguous with the more posterior collection measuring 2.5 x 4.7 cm, which involves the iliopsoas muscle. The appendix is tentatively identified containing intraluminal fluid with similar stranding to the adjacent inflamed cecum and ascending colon. No other bowel wall thickening to suggest additional sites of active Crohn's. Moderate stool in the more distal colon. Vascular/Lymphatic: No significant vascular findings are present. No enlarged abdominal or pelvic lymph nodes. Reproductive: Prostate is unremarkable. Other: Right lower quadrant abscesses as described. No pelvic free fluid. No free air. Musculoskeletal: Heterogeneous edema of the right iliopsoas muscle with small intramuscular abscess as described. Mild heterogeneous enlargement of the distal iliopsoas likely myositis. There are no acute or suspicious osseous abnormalities. IMPRESSION: Active Crohn's involving the terminal ileum and cecum. There are adjacent irregular abscesses in the right lower quadrant involving the adjacent fat and iliopsoas muscle. Fluid collections are irregular in shape, and measure approximately 2.8 and 4.7 cm respectively. Electronically Signed   By: MJeb LeveringM.D.   On: 03/16/2016 03:24   Ct Image Guided Drainage By Percutaneous Catheter  Result Date: 03/21/2016 INDICATION: Right lower quadrant abscess EXAM: CT GUIDED DRAINAGE OF A RIGHT LOWER QUADRANT ABSCESS MEDICATIONS: The patient is currently admitted to the hospital and receiving intravenous antibiotics. The antibiotics were administered within an appropriate time frame prior to the initiation of the procedure. ANESTHESIA/SEDATION: mg IV Versed  mcg IV Fentanyl Moderate Sedation Time: The patient was continuously monitored during the procedure by the interventional radiology nurse under my direct supervision. COMPLICATIONS: None immediate. TECHNIQUE: Informed  written consent was obtained from the patient after a thorough discussion of the procedural risks, benefits and alternatives. All questions were addressed. Maximal Sterile Barrier Technique was utilized including caps, mask, sterile gowns, sterile gloves, sterile drape, hand hygiene and skin antiseptic. A timeout was performed prior to the initiation of the procedure. PROCEDURE: The right lower quadrant was prepped with ChloraPrep in a sterile fashion, and a sterile drape was applied covering the operative field. A sterile gown and sterile gloves were used for the procedure. Local anesthesia was provided with 1% Lidocaine. Under CT guidance, an 18 gauge needle was inserted into the right lower quadrant abscess. It was removed over an Amplatz wire. Twelve FPakistandilator followed by a 12 FPakistandrain were inserted. The drain was looped and string fixed in the fluid collection. 30 cc frank pus was aspirated. It was sewn to the skin. FINDINGS: Imaging documents placement of a 163French right lower quadrant abscess drain. IMPRESSION: Successful CT-guided right lower quadrant abscess drain. Electronically Signed   By: AMarybelle KillingsM.D.   On: 03/21/2016 17:09     Shaymus Eveleth, MD, FACP, FHM. [facility] Pager [phone] If 7PM-7AM, please contact night-coverage [email or web address] Password [email or web address]  03/26/2016, 12:08 PM

## 2016-03-26 NOTE — Progress Notes (Signed)
Nutrition Follow-up  INTERVENTION:   TPN per Pharmacy -recommend weaning now that pt is tolerating soft diet.  Encourage PO intake RD to continue to monitor  NUTRITION DIAGNOSIS:   Unintentional weight loss related to chronic illness, altered GI function (Crohn's disease) as evidenced by per patient/family report, percent weight loss.  Ongoing.  GOAL:   Patient will meet greater than or equal to 90% of their needs  Meeting with TPN.  MONITOR:   PO intake, Supplement acceptance, Labs, Weight trends, I & O's, Other (Comment) (TPN)  ASSESSMENT:   36 y.o. male with medical history significant of Crohn's colitis on azathioprine originally diagnosed in 2015; who presents with waxing and waning sharp right lower quadrant abdominal pain., Recent Crohn's flare in August 2017, still on prednisone taper, admitted for Crohn's exacerbation with abscess.  Patient in room awaiting CT scan. Pt did not eat anything this morning d/t pending procedure. Pt states he ate spaghetti for lunch and fish, macaroni and cheese, brownie and sprite for dinner yesterday. Pt states he is tolerating his meals well. Recommend begin weaning off TPN. Pt still receiving TPN: Clinimix 5/20 at a goal rate of 84m/hr + 20% fat emulsion at 226mhr over 12h to provide: 96g/day protein, 2169Kcal/day. ILE were discontinued.  Medications: Multivitamin with minerals daily, Protonix tablet daily, Florastor capsule BID Labs reviewed: CBGs: 122-149 Mg/Phos/K WNL TG: 103 mg/dL  Pharmacy note pending for 1/29.  Diet Order:  DIET SOFT Room service appropriate? Yes; Fluid consistency: Thin TPN (CLINIMIX-E) Adult  Skin:  Reviewed, no issues  Last BM:  1/26  Height:   Ht Readings from Last 1 Encounters:  03/16/16 6' (1.829 m)    Weight:   Wt Readings from Last 1 Encounters:  03/16/16 173 lb (78.5 kg)    Ideal Body Weight:  80.9 kg  BMI:  Body mass index is 23.46 kg/m.  Estimated Nutritional Needs:   Kcal:   2125-2480 (MSJ x 1.2-1.4)  Protein:  95-110 grams (1.2-1.4 grams/kg)  Fluid:  2.3 L/day (30 ml/kg)  EDUCATION NEEDS:   No education needs identified at this time  LiClayton BiblesMS, RD, LDN Pager: [phone] Hours Pager: [phone]

## 2016-03-27 DIAGNOSIS — D72829 Elevated white blood cell count, unspecified: Secondary | ICD-10-CM

## 2016-03-27 DIAGNOSIS — Z8249 Family history of ischemic heart disease and other diseases of the circulatory system: Secondary | ICD-10-CM

## 2016-03-27 DIAGNOSIS — B9620 Unspecified Escherichia coli [E. coli] as the cause of diseases classified elsewhere: Secondary | ICD-10-CM

## 2016-03-27 DIAGNOSIS — Z978 Presence of other specified devices: Secondary | ICD-10-CM

## 2016-03-27 DIAGNOSIS — B9689 Other specified bacterial agents as the cause of diseases classified elsewhere: Secondary | ICD-10-CM

## 2016-03-27 DIAGNOSIS — Z841 Family history of disorders of kidney and ureter: Secondary | ICD-10-CM

## 2016-03-27 LAB — SEDIMENTATION RATE: Sed Rate: 71 mm/hr — ABNORMAL HIGH (ref 0–16)

## 2016-03-27 MED ORDER — SODIUM CHLORIDE 0.9 % IV SOLN: 50.0000 mg | INTRAVENOUS | 13 refills | Status: DC

## 2016-03-27 MED ORDER — HEPARIN SOD (PORK) LOCK FLUSH 100 UNIT/ML IV SOLN
500.0000 [IU] | Freq: Once | INTRAVENOUS | Status: AC
  Administered 2016-03-27: 500 [IU] via INTRAVENOUS
  Filled 2016-03-27: qty 5

## 2016-03-27 MED ORDER — DEXTROSE 5 % IV SOLN: 2.0000 g | INTRAVENOUS | 13 refills | Status: DC

## 2016-03-27 MED ORDER — ACETAMINOPHEN 325 MG PO TABS: 625.0000 mg | ORAL_TABLET | Freq: Four times a day (QID) | ORAL | Status: DC | PRN

## 2016-03-27 MED ORDER — SACCHAROMYCES BOULARDII 250 MG PO CAPS: 250.0000 mg | ORAL_CAPSULE | Freq: Two times a day (BID) | ORAL | 0 refills | Status: DC

## 2016-03-27 MED ORDER — OXYCODONE HCL 5 MG PO TABS: 5.0000 mg | ORAL_TABLET | Freq: Three times a day (TID) | ORAL | 0 refills | Status: DC | PRN

## 2016-03-27 MED ORDER — ANIDULAFUNGIN 100 MG IV SOLR
200.0000 mg | INTRAVENOUS | Status: AC
  Administered 2016-03-27: 200 mg via INTRAVENOUS
  Filled 2016-03-27: qty 200

## 2016-03-27 MED ORDER — SALINE FLUSH 0.9 % IV SOLN: INTRAVENOUS | 0 refills | Status: DC

## 2016-03-27 NOTE — Progress Notes (Signed)
Referring Physician(s): Connor,C  Supervising Physician: Jacqulynn Cadet  Patient Status:  [facility] - In-pt  Chief Complaint: Abdominal abcess   Subjective: Pt is doing well, no acute complaints at this time; anticipate d/c home today   Allergies: Patient has no known allergies.  Medications: Prior to Admission medications   Medication Sig Start Date End Date Taking? Authorizing Provider  acetaminophen (TYLENOL) 500 MG tablet Take 1,000 mg by mouth every 6 (six) hours as needed for moderate pain.   Yes Historical Provider, MD  azaTHIOprine (IMURAN) 50 MG tablet Take 2 tablets by mouth daily. 02/18/16  Yes Historical Provider, MD  predniSONE (DELTASONE) 10 MG tablet Take 5 mg by mouth daily.  03/02/16  Yes Historical Provider, MD     Vital Signs: BP 108/65 (BP Location: Left Arm)   Pulse 90   Temp 97.9 F (36.6 C) (Oral)   Resp 18   Ht 6' (1.829 m)   Wt 173 lb (78.5 kg)   SpO2 98%   BMI 23.46 kg/m   Physical Exam RLQ drain output: 20 cc of straw-colored, turbid fluid, drain site clean, dry, and intact  Imaging: Ct Abdomen Pelvis W Contrast  Result Date: 03/26/2016 CLINICAL DATA:  Status post drain placement 03/21/2016 for right lower quadrant abscess. Crohn colitis. EXAM: CT ABDOMEN AND PELVIS WITH CONTRAST TECHNIQUE: Multidetector CT imaging of the abdomen and pelvis was performed using the standard protocol following bolus administration of intravenous contrast. CONTRAST:  15m ISOVUE-300 IOPAMIDOL (ISOVUE-300) INJECTION 61% COMPARISON:  03/20/2016 FINDINGS: Lower chest: Clear lung bases. Normal heart size without pericardial or pleural effusion. An incompletely image central line. Hepatobiliary: Normal liver. Normal gallbladder, without biliary ductal dilatation. Pancreas: Normal, without mass or ductal dilatation. Spleen: Normal in size, without focal abnormality. Adrenals/Urinary Tract: Normal adrenal glands. Normal kidneys, without hydronephrosis. Normal urinary  bladder. Stomach/Bowel: Normal stomach, without wall thickening. Normal colon. Terminal and distal ileal wall thickening is again identified. Felt to be slightly improved. The appendix is dilated and hyperenhancing including on image 56/series 2. This is chronic and favored to be secondary. fluid and gas collection medial to the terminal ileum measures 1.6 cm on image 61/series 2. A more inferior and anterior collection a 2.0 cm on image 67/series 2. These are new. More lateral fluid collections are decreased in size, status post drain placement. Collection along the right psoas muscle is nearly completely resolved, measuring 2.1 cm maximally on image 73/series 2. Compare 5.2 x 3.5 cm on the prior exam (when remeasured). More anterior right-sided collection measures 2.0 cm on image 71/series 2 versus 3.0 cm on the prior exam (when remeasured). More proximal small bowel loops are normal in caliber. Vascular/Lymphatic: Normal caliber of the aorta and branch vessels. No abdominopelvic adenopathy. Reproductive: Normal prostate. Other: No significant free fluid. Musculoskeletal: No acute osseous abnormality. IMPRESSION: 1. Terminal ileitis, likely mildly improved. Interval decrease in size of lateral right lower quadrant abscesses, status post drain placement. 2. Developing smaller fluid collections medially, suspicious for mesenteric abscesses. Electronically Signed   By: KAbigail MiyamotoM.D.   On: 03/26/2016 12:07    Labs:  CBC:  Recent Labs  03/20/16 0534 03/21/16 0542 03/21/16 0700 03/26/16 0844  WBC 12.7* QUESTIONABLE RESULTS, RECOMMEND RECOLLECT TO VERIFY 12.0* 10.2  HGB 9.2* QUESTIONABLE RESULTS, RECOMMEND RECOLLECT TO VERIFY 9.2* 10.1*  HCT 29.1* QUESTIONABLE RESULTS, RECOMMEND RECOLLECT TO VERIFY 29.0* 32.2*  PLT 368 QUESTIONABLE RESULTS, RECOMMEND RECOLLECT TO VERIFY 382 381    COAGS:  Recent Labs  03/21/16  0542 03/21/16 0700  INR NOT CALCULATED 1.33    BMP:  Recent Labs   03/20/16 0534 03/21/16 0700 03/22/16 0822 03/26/16 0844  NA 138 134* 135 139  K 3.7 3.7 4.2 3.9  CL 107 104 103 107  CO2 24 23 26 27   GLUCOSE 124* 125* 141* 134*  BUN 12 12 12 13   CALCIUM 8.4* 8.2* 8.6* 8.8*  CREATININE 0.71 0.77 0.56* 0.80  GFRNONAA >60 >60 >60 >60  GFRAA >60 >60 >60 >60    LIVER FUNCTION TESTS:  Recent Labs  03/18/16 0800 03/19/16 0530 03/22/16 0822 03/26/16 0844  BILITOT 0.6 0.4 0.4 0.2*  AST 11* 12* 16 18  ALT 9* 8* 11* 20  ALKPHOS 31* 30* 35* 46  PROT 6.6 7.0 7.8 7.5  ALBUMIN 2.7* 2.9* 3.0* 2.9*    Assessment and Plan:  Chron's Colitis; s/p RLQ abdominal abscess drain placement: pt will be d/c'd home today. Educated on drain maintenance including flushing and recharging bulb once daily and dressing changes twice daily. Pt will follow up in 1-1.5 weeks at the out-patient IR clinic([phone]).  Electronically Signed: D. Rowe Robert 03/27/2016, 10:50 AM   I spent a total of 15 minutes at the the patient's bedside AND on the patient's hospital floor or unit, greater than 50% of which was counseling/coordinating care for abdominal abscess drain    Patient ID: Theseus Birnie, male   DOB: August 26, 1980, 36 y.o.   MRN: [id number]

## 2016-03-27 NOTE — Progress Notes (Signed)
Pt to DC home with IV abx. Pt offered choice for IV infusion company. [facility] chosen and referral called to [facility] infusion rep. Will need HHRN orders and scripts prior to discharge.  Marney Doctor RN,BSN,NCM [phone]

## 2016-03-27 NOTE — Progress Notes (Addendum)
Progress Note   Subjective  Chief Complaint: Crohn's with recurrent abscess status post drainage procedure  Patient continues to do well this morning. His TPN was discontinued last night and he is tolerating his diet well. He has no new complaints. Did discuss that he has follow-up appointment set in our clinic for February 13 with me. Plans are for discharge today.    Objective   Vital signs in last 24 hours: Temp:  [97.9 F (36.6 C)-99.2 F (37.3 C)] 97.9 F (36.6 C) (01/30 0423) Pulse Rate:  [90-91] 90 (01/30 0423) Resp:  [16-18] 18 (01/30 0423) BP: (97-108)/(56-65) 108/65 (01/30 0423) SpO2:  [97 %-98 %] 98 % (01/30 0423) Last BM Date: 03/26/16 General: African American male in NAD Heart:  Regular rate and rhythm; no murmurs Lungs: Respirations even and unlabored, lungs CTA bilaterally Abdomen:  Soft, nontender and nondistended. Normal bowel sounds.drain in place Extremities:  Without edema. Neurologic:  Alert and oriented,  grossly normal neurologically. Psych:  Cooperative. Normal mood and affect.  Intake/Output from previous day: 01/29 0701 - 01/30 0700 In: 1319.3 [P.O.:600; I.V.:659.3; IV Piggyback:50] Out: 61 [Urine:800; Drains:20]  Lab Results:  Recent Labs  03/26/16 0844  WBC 10.2  HGB 10.1*  HCT 32.2*  PLT 381   BMET  Recent Labs  03/26/16 0844  NA 139  K 3.9  CL 107  CO2 27  GLUCOSE 134*  BUN 13  CREATININE 0.80  CALCIUM 8.8*   LFT  Recent Labs  03/26/16 0844  PROT 7.5  ALBUMIN 2.9*  AST 18  ALT 20  ALKPHOS 46  BILITOT 0.2*   PT/INR No results for input(s): LABPROT, INR in the last 72 hours.  Studies/Results: Ct Abdomen Pelvis W Contrast  Result Date: 03/26/2016 CLINICAL DATA:  Status post drain placement 03/21/2016 for right lower quadrant abscess. Crohn colitis. EXAM: CT ABDOMEN AND PELVIS WITH CONTRAST TECHNIQUE: Multidetector CT imaging of the abdomen and pelvis was performed using the standard protocol following bolus  administration of intravenous contrast. CONTRAST:  169m ISOVUE-300 IOPAMIDOL (ISOVUE-300) INJECTION 61% COMPARISON:  03/20/2016 FINDINGS: Lower chest: Clear lung bases. Normal heart size without pericardial or pleural effusion. An incompletely image central line. Hepatobiliary: Normal liver. Normal gallbladder, without biliary ductal dilatation. Pancreas: Normal, without mass or ductal dilatation. Spleen: Normal in size, without focal abnormality. Adrenals/Urinary Tract: Normal adrenal glands. Normal kidneys, without hydronephrosis. Normal urinary bladder. Stomach/Bowel: Normal stomach, without wall thickening. Normal colon. Terminal and distal ileal wall thickening is again identified. Felt to be slightly improved. The appendix is dilated and hyperenhancing including on image 56/series 2. This is chronic and favored to be secondary. fluid and gas collection medial to the terminal ileum measures 1.6 cm on image 61/series 2. A more inferior and anterior collection a 2.0 cm on image 67/series 2. These are new. More lateral fluid collections are decreased in size, status post drain placement. Collection along the right psoas muscle is nearly completely resolved, measuring 2.1 cm maximally on image 73/series 2. Compare 5.2 x 3.5 cm on the prior exam (when remeasured). More anterior right-sided collection measures 2.0 cm on image 71/series 2 versus 3.0 cm on the prior exam (when remeasured). More proximal small bowel loops are normal in caliber. Vascular/Lymphatic: Normal caliber of the aorta and branch vessels. No abdominopelvic adenopathy. Reproductive: Normal prostate. Other: No significant free fluid. Musculoskeletal: No acute osseous abnormality. IMPRESSION: 1. Terminal ileitis, likely mildly improved. Interval decrease in size of lateral right lower quadrant abscesses, status post drain placement.  2. Developing smaller fluid collections medially, suspicious for mesenteric abscesses. Electronically Signed   By:  Abigail Miyamoto M.D.   On: 03/26/2016 12:07     Assessment / Plan:   Assessment: 1. Ileal Crohn's complicated by abscess status post percutaneous drain placement  Plan: 1. Plans are for discharge today with follow up in our office scheduled for 04/10/16 with me, this will be on his discharge paperwork 2. Antibiotics are being arranged with the hospitalist and infectious disease service 3. Patient is to follow-up with IR for repeat CT in 1 week and follow-up of drain 4. Discussed all of the above with the patient today, he is aware of recommendations 5. Please await any further recommendations from Dr. Fuller Plan  Thank you for your kind consultation, we will continue to follow along   LOS: 11 days   Levin Erp  03/27/2016, 9:26 AM  Pager # [phone]      Attending physician's note   I have taken an interval history, reviewed the chart and examined the patient. I agree with the Advanced Practitioner's note, impression and recommendations. PO antibiotics until abscesses resolve. Hospitalist and ID to recommend an appropriate PO antibiotic. OK for discharge today with follow up as outlined. Primary GI MD is Dr. Jamesburg Cellar.    Lucio Edward, MD [facility] [phone] Mon-Fri 8a-5p [phone] after 5p, weekends, holidays   Addendum, 1537: ID consult recommends ceftriaxone IV for at least 14 days plus caspofungin. HHN for IV antibiotics and drain mgmt. OK for discharge from GI standpoint.

## 2016-03-27 NOTE — Discharge Summary (Addendum)
Physician Discharge Summary  Larry [name] TRV:[id number] DOB: 06-Aug-1980  PCP: Pcp Not In System  Admit date: 03/16/2016 Discharge date: 03/27/2016  Recommendations for Outpatient Follow-up:  1. Ellouise Newer, PA, [facility] on 04/10/16 at 8:45 AM. 2. Dr. Carlyle Basques, Infectious Disease on 04/12/16. 3. Outpatient Interventional Radiology clinic (phone number [phone]): Clinic will arrange follow-up in 1-1.5 weeks for management of percutaneous drain. 4. FMLA and short-term disability forms were completed on patient's request.  Home Health: RN for management of PICC line, prolonged IV antibiotics and associated labs. Equipment/Devices: None    Discharge Condition: Improved and stable  CODE STATUS: Full  Diet recommendation: Heart healthy diet  Discharge Diagnoses:  Principal Problem:   Crohn's colitis, with abscess ([place]) Active Problems:   Crohn's colitis ([place])   Leukocytosis   Anemia   Abnormal CT of the abdomen   Crohn's disease of ileum with abscess ([place])   Feeding difficulties   Abdominal abscess ([place])   Brief/Interim Summary: 36 year old male with PMH of Crohn's colitis on azathioprine, initially diagnosed in 2015 when he had presented with abscesses that required drainage. He followed up with Dr. Britta Mccreedy in [place] who apparently did colonoscopy and was reportedly normal. He has been on Imuran 100 MG daily since that time along with prednisone (since August when placed on prednisone for a flare by Dr. Britta Mccreedy). Prednisone was tapered to 5 mg daily prior to this admission. Dr. Britta Mccreedy has relocated and patient has not seen him back since August. He now presented with right lower quadrant abdominal pain, hard stool every 3 days or so without rectal bleeding or diarrhea, reduced appetite and weight loss. Initial CT abdomen showed active Crohn's colitis with abscess collections in the right lower quadrant involving iliopsoas muscle.  Assessment and plan:  Crohn's Terminal  ileitis, with Escherichia coli & Saccharomyces Cerevisiae abscess - CT abdomen pelvis significant for Crohn's colitis with abscess. - GI was consulted. Imuran was discontinued. As per GI, he is going to need biological therapy when these acute abscesses resolve. Hepatitis B surface antibody: 4.1 (inconsistent with immunity), QuantiFERON TB gold 03/16/16: Indeterminate. Hepatitis B surface antigen: Negative. As discussed with GI, may need hepatitis immunization and repeating QuantiFERON TB gold or PPD prior to biological. - As per IR, initially his abscesses were not amenable to percutaneous drainage. He was treated with bowel rest/nothing by mouth, IV Zosyn, TPN. - [facility] and general surgery were consulted and assisted with management.  - Reimaged with CT abdomen on 03/20/16 which showed abscesses had enlarged. IR was re consulted for percutaneous drainage. - IR placed CT-guided RLQ drain on 03/21/16. Abscess culture shows Escherichia coli & Saccharomyces Cerevisiae. As per GI follow-up, this was the second time that the patient has had drainage of Crohn's related abscess and when infection is optimally controlled, they think that the next step is ileal resection followed by outpatient GI management of his Crohn's after recovering from surgery. Surgery most likely will be electively and as outpatient.  - With these measures, patient gradually improved. He remained afebrile and leukocytosis resolved. Volume of RLQ drain progressively decreased. His abdominal pain significantly improved and now is only related to the drain site. His diet was gradually advanced which she has tolerated and he is having daily BMs. Based on culture results, antibiotics were changed to IV ceftriaxone.  - Repeat CT abdomen and pelvis results are as below and shows improvement. Interventional radiology and [facility] have seen and cleared for discharge and will closely follow up  as outpatient.  - Infectious disease was consulted to  guide antimicrobial treatment. Dr. Baxter Flattery input appreciated and discussed with her. She indicates patient has abdominal abscesses with polymicrobial infection including yeast and recommended minimum of 14 days of ceftriaxone 2 g IV daily + caspofungin 50 mg IV daily (patient received 1 dose of Anidulafungin on day of discharge), checking ESR and CRP, will need follow-up CT abdomen and pelvis in 2 weeks to see if still needs drain and to see fluid collections have resolved. If his repeat imaging shows resolution of abdominal abscesses, then he will not need further oral antibiotics. - Imuran and prednisone are on hold until patient follows up with [facility] as outpatient. Recommend checking Quantiferon again as an outpatient.  - Home health was consulted to assist with PICC line, prolonged IV antibiotics and lab work.  Anemia - Reasonably stable since admission and has been in the low 9 g per DL range for the last several days. Likely anemia of chronic disease.  Thrombocytosis - likely reactive . Resolved.  Leukocytosis  - Resolved.   Intermediate results on QuantiFERON testing  - As indicated above, recommend repeating his outpatient.   Health maintenance  - Follow up HIV antibody testing and hepatitis C testing.    Discharge Instructions  Discharge Instructions    Call MD for:  difficulty breathing, headache or visual disturbances    Complete by:  As directed    Call MD for:  extreme fatigue    Complete by:  As directed    Call MD for:  persistant dizziness or light-headedness    Complete by:  As directed    Call MD for:  persistant nausea and vomiting    Complete by:  As directed    Call MD for:  redness, tenderness, or signs of infection (pain, swelling, redness, odor or green/yellow discharge around incision site)    Complete by:  As directed    Call MD for:  severe uncontrolled pain    Complete by:  As directed    Call MD for:  temperature >100.4    Complete by:  As  directed    Diet - low sodium heart healthy    Complete by:  As directed    Discharge instructions    Complete by:  As directed    As per instructions provided to you by the radiology team, drain maintenance including flushing and recharging bulb once daily and dressing changes twice daily.   Increase activity slowly    Complete by:  As directed        Medication List    STOP taking these medications   azaTHIOprine 50 MG tablet Commonly known as:  IMURAN   predniSONE 10 MG tablet Commonly known as:  DELTASONE     TAKE these medications   acetaminophen 325 MG tablet Commonly known as:  TYLENOL Take 2 tablets (650 mg total) by mouth every 6 (six) hours as needed for mild pain, moderate pain or fever. What changed:  medication strength  how much to take  reasons to take this   caspofungin 50 mg in sodium chloride 0.9 % 250 mL Inject 50 mg into the vein daily. Stop after 04/10/2016 dose. Start taking on:  03/28/2016   cefTRIAXone 2 g in dextrose 5 % 50 mL Inject 2 g into the vein daily. Stop after 04/10/16 dose. Start taking on:  03/28/2016   oxyCODONE 5 MG immediate release tablet Commonly known as:  Oxy IR/ROXICODONE Take 1 tablet (  5 mg total) by mouth every 8 (eight) hours as needed for severe pain or breakthrough pain.   saccharomyces boulardii 250 MG capsule Commonly known as:  FLORASTOR Take 1 capsule (250 mg total) by mouth 2 (two) times daily.   Saline Flush 0.9 % Soln RLQ JP drain care: rec once daily flush with 5- 10 mls sterile NS, recording of output and dressing changes twice daily.      Follow-up Information    Levin Erp, [place] Follow up on 04/10/2016.   Specialty:  Gastroenterology Why:  at 8:45 AM. Contact information: [place] [address] [phone]        Carlyle Basques, MD Follow up on 04/12/2016.   Specialty:  Infectious Diseases Contact information: [place]  [address] [phone]          No Known Allergies  Consultations:  [facility]  Gen. Surgery  Interventional radiology  Infectious disease   Procedures/Studies: Ct Abdomen Pelvis W Contrast  Result Date: 03/26/2016 CLINICAL DATA:  Status post drain placement 03/21/2016 for right lower quadrant abscess. Crohn colitis. EXAM: CT ABDOMEN AND PELVIS WITH CONTRAST TECHNIQUE: Multidetector CT imaging of the abdomen and pelvis was performed using the standard protocol following bolus administration of intravenous contrast. CONTRAST:  174m ISOVUE-300 IOPAMIDOL (ISOVUE-300) INJECTION 61% COMPARISON:  03/20/2016 FINDINGS: Lower chest: Clear lung bases. Normal heart size without pericardial or pleural effusion. An incompletely image central line. Hepatobiliary: Normal liver. Normal gallbladder, without biliary ductal dilatation. Pancreas: Normal, without mass or ductal dilatation. Spleen: Normal in size, without focal abnormality. Adrenals/Urinary Tract: Normal adrenal glands. Normal kidneys, without hydronephrosis. Normal urinary bladder. Stomach/Bowel: Normal stomach, without wall thickening. Normal colon. Terminal and distal ileal wall thickening is again identified. Felt to be slightly improved. The appendix is dilated and hyperenhancing including on image 56/series 2. This is chronic and favored to be secondary. fluid and gas collection medial to the terminal ileum measures 1.6 cm on image 61/series 2. A more inferior and anterior collection a 2.0 cm on image 67/series 2. These are new. More lateral fluid collections are decreased in size, status post drain placement. Collection along the right psoas muscle is nearly completely resolved, measuring 2.1 cm maximally on image 73/series 2. Compare 5.2 x 3.5 cm on the prior exam (when remeasured). More anterior right-sided collection measures 2.0 cm on image 71/series 2 versus 3.0 cm on the prior exam (when remeasured). More proximal small bowel loops are  normal in caliber. Vascular/Lymphatic: Normal caliber of the aorta and branch vessels. No abdominopelvic adenopathy. Reproductive: Normal prostate. Other: No significant free fluid. Musculoskeletal: No acute osseous abnormality. IMPRESSION: 1. Terminal ileitis, likely mildly improved. Interval decrease in size of lateral right lower quadrant abscesses, status post drain placement. 2. Developing smaller fluid collections medially, suspicious for mesenteric abscesses. Electronically Signed   By: KAbigail MiyamotoM.D.   On: 03/26/2016 12:07   Ct Abdomen Pelvis W Contrast  Result Date: 03/20/2016 CLINICAL DATA:  Right lower quadrant abdominal pain. History of Crohn' s disease. EXAM: CT ABDOMEN AND PELVIS WITH CONTRAST TECHNIQUE: Multidetector CT imaging of the abdomen and pelvis was performed using the standard protocol following bolus administration of intravenous contrast. CONTRAST:  864mISOVUE-300 IOPAMIDOL (ISOVUE-300) INJECTION 61% COMPARISON:  03/16/2016 CT scan FINDINGS: Lower chest: Linear subsegmental atelectasis in the posterior basal segment right lower lobe. Density in the cavoatrial junction is probably a central line, correlate with patient history. Hepatobiliary: Unremarkable Pancreas: Unremarkable  Spleen: Unremarkable Adrenals/Urinary Tract: Unremarkable Stomach/Bowel: Similar to prior there is a 14 cm region of severe wall thickening in the terminal ileum with abscess tracking along the right transverse abdominis muscle in the right retroperitoneum to involve the right psoas muscle, volume of abscess approximately 80 cc and containing gas and fluid with thick enhancing margins, this is increased in size compared to the prior exam. On images 56-57 of series 2 there small locules of extraluminal gas medial to the terminal ileum compatible with contained perforation or small microabscesses. Air-fluid level in the contrast in the rectum. Vascular/Lymphatic: Small reactive right inguinal and pelvic lymph  nodes. Reproductive: Unremarkable Other: No supplemental non-categorized findings. Musculoskeletal: Sacroiliac joints unremarkable. Partially calcified annulus fibrosis along a disc bulge at L5-S1. IMPRESSION: 1. Severe wall thickening in the distal 14 cm of the terminal ileum, with small amounts of adjacent extraluminal loculated gas containing compatible with micro perforation, and with enlargement of the right transverse abdominis and psoas muscle abscess which is approximately 80 cc in volume. Electronically Signed   By: Van Clines M.D.   On: 03/20/2016 12:12   Ct Abdomen Pelvis W Contrast  Result Date: 03/16/2016 CLINICAL DATA:  Abdominal pain. Right lower quadrant pain. Weight loss. Crohn's disease. EXAM: CT ABDOMEN AND PELVIS WITH CONTRAST TECHNIQUE: Multidetector CT imaging of the abdomen and pelvis was performed using the standard protocol following bolus administration of intravenous contrast. CONTRAST:  167m ISOVUE-300 IOPAMIDOL (ISOVUE-300) INJECTION 61% COMPARISON:  [facility] FINDINGS: Lower chest: The lung bases are clear. Hepatobiliary: No focal liver abnormality is seen. No gallstones, gallbladder wall thickening, or biliary dilatation. Pancreas: No ductal dilatation or inflammation. Spleen: Normal in size without focal abnormality. Adrenals/Urinary Tract: Normal adrenal glands. Symmetric renal enhancement without hydronephrosis. Urinary bladder is physiologically distended. Stomach/Bowel: Wall thickening involving the terminal ileum and cecum with moderate adjacent mesenteric inflammation consistent with active Crohn's. There multiple adjacent irregular fluid collections with peripheral enhancement in the right lower quadrant consistent with abscess. These involve both the adjacent fat and right iliopsoas muscle. A discrete collection anteriorly measures 2.8 x 1.7 cm. This may be contiguous with the more posterior collection measuring  2.5 x 4.7 cm, which involves the iliopsoas muscle. The appendix is tentatively identified containing intraluminal fluid with similar stranding to the adjacent inflamed cecum and ascending colon. No other bowel wall thickening to suggest additional sites of active Crohn's. Moderate stool in the more distal colon. Vascular/Lymphatic: No significant vascular findings are present. No enlarged abdominal or pelvic lymph nodes. Reproductive: Prostate is unremarkable. Other: Right lower quadrant abscesses as described. No pelvic free fluid. No free air. Musculoskeletal: Heterogeneous edema of the right iliopsoas muscle with small intramuscular abscess as described. Mild heterogeneous enlargement of the distal iliopsoas likely myositis. There are no acute or suspicious osseous abnormalities. IMPRESSION: Active Crohn's involving the terminal ileum and cecum. There are adjacent irregular abscesses in the right lower quadrant involving the adjacent fat and iliopsoas muscle. Fluid collections are irregular in shape, and measure approximately 2.8 and 4.7 cm respectively. Electronically Signed   By: MJeb LeveringM.D.   On: 03/16/2016 03:24   Ct Image Guided Drainage By Percutaneous Catheter  Result Date: 03/21/2016 INDICATION: Right lower quadrant abscess EXAM: CT GUIDED DRAINAGE OF A RIGHT LOWER QUADRANT ABSCESS MEDICATIONS: The patient is currently admitted to the hospital and receiving intravenous antibiotics. The antibiotics were administered within an appropriate time frame prior to the initiation of the procedure. ANESTHESIA/SEDATION: mg IV  Versed  mcg IV Fentanyl Moderate Sedation Time: The patient was continuously monitored during the procedure by the interventional radiology nurse under my direct supervision. COMPLICATIONS: None immediate. TECHNIQUE: Informed written consent was obtained from the patient after a thorough discussion of the procedural risks, benefits and alternatives. All questions were addressed.  Maximal Sterile Barrier Technique was utilized including caps, mask, sterile gowns, sterile gloves, sterile drape, hand hygiene and skin antiseptic. A timeout was performed prior to the initiation of the procedure. PROCEDURE: The right lower quadrant was prepped with ChloraPrep in a sterile fashion, and a sterile drape was applied covering the operative field. A sterile gown and sterile gloves were used for the procedure. Local anesthesia was provided with 1% Lidocaine. Under CT guidance, an 18 gauge needle was inserted into the right lower quadrant abscess. It was removed over an Amplatz wire. Twelve [place] dilator followed by a 12 [place] drain were inserted. The drain was looped and string fixed in the fluid collection. 30 cc frank pus was aspirated. It was sewn to the skin. FINDINGS: Imaging documents placement of a 59 French right lower quadrant abscess drain. IMPRESSION: Successful CT-guided right lower quadrant abscess drain. Electronically Signed   By: Marybelle Killings M.D.   On: 03/21/2016 17:09  Right upper arm PICC line 03/17/16 CT-guided RLQ drain placement by IR on 03/21/16     Subjective:  Continues to feel better. Tolerating soft diet without worsening abdominal pain, nausea or vomiting. Having daily BMs. Ambulating comfortably. Mild peridrain pain with certain positions. No dizziness, lightheadedness, chest pain, dyspnea or palpitations. Anxious to go home.   Discharge Exam:  Vitals:   03/26/16 1509 03/26/16 2015 03/27/16 0423 03/27/16 1305  BP: (!) 97/56 103/61 108/65 97/62  Pulse: 90 91 90 96  Resp: 18 16 18 16   Temp: 98.3 F (36.8 C) 99.2 F (37.3 C) 97.9 F (36.6 C) 99.1 F (37.3 C)  TempSrc: Oral Oral Oral Oral  SpO2: 98% 97% 98% 99%  Weight:      Height:        Gen. exam: Awake Alert, Oriented X 3,  Neck: Supple Neck,No JVD,  Respiratory system: Symmetrical Chest wall movement, Good air movement bilaterally, CTAB CVS: RRR,No Gallops,Rubs or new Murmurs, No Parasternal  Heave  GI: Nondistended and soft. RLQ drain with small amount of serosanguineous drainage. No tenderness or peritoneal signs. Normal bowel sounds heard. Extremities: No Cyanosis, Clubbing or edema, No new Rash or bruise. Right upper arm PICC line site clean and dry. CNS: Alert and oriented. No focal deficits.    The results of significant diagnostics from this hospitalization (including imaging, microbiology, ancillary and laboratory) are listed below for reference.     Microbiology: Recent Results (from the past 240 hour(s))  Aerobic/Anaerobic Culture (surgical/deep wound)     Status: None   Collection Time: 03/21/16  5:08 PM  Result Value Ref Range Status   Specimen Description ABSCESS ABDOMEN  Final   Special Requests NONE  Final   Gram Stain   Final    ABUNDANT WBC PRESENT, PREDOMINANTLY PMN NO ORGANISMS SEEN    Culture   Final    RARE ESCHERICHIA COLI FEW SACCHAROMYCES CEREVISIAE NO ANAEROBES ISOLATED Performed at [facility], [address], [place], [place] [address]    Report Status 03/26/2016 FINAL  Final   Organism ID, Bacteria ESCHERICHIA COLI  Final      Susceptibility   Escherichia coli - MIC*    AMPICILLIN >=32 RESISTANT Resistant  CEFAZOLIN <=4 SENSITIVE Sensitive     CEFEPIME <=1 SENSITIVE Sensitive     CEFTAZIDIME <=1 SENSITIVE Sensitive     CEFTRIAXONE <=1 SENSITIVE Sensitive     CIPROFLOXACIN >=4 RESISTANT Resistant     GENTAMICIN <=1 SENSITIVE Sensitive     IMIPENEM <=0.25 SENSITIVE Sensitive     TRIMETH/SULFA <=20 SENSITIVE Sensitive     AMPICILLIN/SULBACTAM 16 INTERMEDIATE Intermediate     PIP/TAZO <=4 SENSITIVE Sensitive     Extended ESBL NEGATIVE Sensitive     * RARE ESCHERICHIA COLI     Labs:  Basic Metabolic Panel:  Recent Labs Lab 03/21/16 0700 03/22/16 0822 03/26/16 0844  NA 134* 135 139  K 3.7 4.2 3.9  CL 104 103 107  CO2 23 26 27   GLUCOSE 125* 141* 134*  BUN 12 12 13   CREATININE 0.77 0.56* 0.80  CALCIUM 8.2* 8.6*  8.8*  MG  --  2.1 1.9  PHOS  --  3.1 3.5   Liver Function Tests:  Recent Labs Lab 03/22/16 0822 03/26/16 0844  AST 16 18  ALT 11* 20  ALKPHOS 35* 46  BILITOT 0.4 0.2*  PROT 7.8 7.5  ALBUMIN 3.0* 2.9*   CBC:  Recent Labs Lab 03/21/16 0542 03/21/16 0700 03/26/16 0844  WBC QUESTIONABLE RESULTS, RECOMMEND RECOLLECT TO VERIFY 12.0* 10.2  NEUTROABS  --   --  7.8*  HGB QUESTIONABLE RESULTS, RECOMMEND RECOLLECT TO VERIFY 9.2* 10.1*  HCT QUESTIONABLE RESULTS, RECOMMEND RECOLLECT TO VERIFY 29.0* 32.2*  MCV QUESTIONABLE RESULTS, RECOMMEND RECOLLECT TO VERIFY 79.2 81.9  PLT QUESTIONABLE RESULTS, RECOMMEND RECOLLECT TO VERIFY 382 381   CBG:  Recent Labs Lab 03/25/16 0552 03/25/16 1525 03/25/16 2159 03/26/16 0621 03/26/16 1508  GLUCAP 126* 118* 149* 122* 100*   Lipid Profile  Recent Labs  03/26/16 0844  TRIG 103   Urinalysis    Component Value Date/Time   COLORURINE AMBER (A) 03/16/2016 0219   APPEARANCEUR CLEAR 03/16/2016 0219   LABSPEC 1.029 03/16/2016 0219   PHURINE 5.0 03/16/2016 0219   GLUCOSEU NEGATIVE 03/16/2016 0219   HGBUR SMALL (A) 03/16/2016 0219   BILIRUBINUR NEGATIVE 03/16/2016 0219   KETONESUR 5 (A) 03/16/2016 0219   PROTEINUR 30 (A) 03/16/2016 0219   NITRITE NEGATIVE 03/16/2016 0219   LEUKOCYTESUR NEGATIVE 03/16/2016 0219     Time coordinating discharge: Over 30 minutes  SIGNED:  Vernell Leep, MD, FACP, FHM. [facility] Pager [phone]  If 7PM-7AM, please contact night-coverage [email or web address] Password [email or web address] 03/27/2016, 4:35 PM

## 2016-03-27 NOTE — Discharge Instructions (Signed)
Percutaneous Abscess Drain An abscess is a collection of infected fluid inside the body. Your health care provider may decide to remove or drain the infected fluid from the area by placing a thin needle into the abscess. Usually, a small tube is left in place to drain the abscess fluid. The abscess fluid may take a few days to drain. LET [facility] CARE PROVIDER KNOW ABOUT:  Any allergies you have.  All medicines you are taking, including vitamins, herbs, eye drops, creams, and over-the-counter medicines. This includes steroid medicines by mouth or cream.  Previous problems you or members of your family have had with the use of anesthetics.  Any blood disorders you have.  Previous surgeries you have had.  Possibility of pregnancy, if this applies.  Medical conditions you have.  Any history of smoking. RISKS AND COMPLICATIONS Generally, this is a safe procedure. However, problems can occur and include:   Infection.  Allergic reaction to materials used (such as contrast dye).  Damage to a nearby organ or tissue.  Bleeding.  Blockage of a tube placed to drain the abscess, requiring placement of a new drainage tube.  A need to repeat the procedure.  Failure of the procedure to adequately drain the abscess, requiring an open surgical procedure to do so. BEFORE THE PROCEDURE   Ask your health care provider about:  Changing or stopping your regular medicines. This is especially important if you are taking diabetes medicines or blood thinners.  Taking medicines such as aspirin and ibuprofen. These medicines can thin your blood. Do not take these medicines before your procedure if your health care provider asks you not to.  Your health care provider may do some blood or urine tests. These will help your health care provider learn how well your kidneys and liver are working and how well your blood clots.  Do not eat or drink anything after midnight on the night before the  procedure or as directed by your health care provider.  Make arrangements for someone to drive you home after the procedure.  PROCEDURE   An IV tube will be placed in your arm. Medicine will be able to flow directly into your body through this tube.  You will lie on an X-ray table.  Your heart rate, blood pressure, and breathing will be monitored.   Your oxygen level will also be watched during the procedure. Supplemental oxygen may be given if necessary.  The skin around the area where the drainage tube (catheter) will be placed will be cleaned and numbed.  A small cut (incision) will then be made to insert the drainage tube. The drainage tube will be inserted using X-ray or CT scan to help direct where it should be placed.  The drainage tube will be guided into the abscess to drain the infected fluid.  The drainage tube may stay in place and be connected to a bag outside your body. It will stay until the fluid has stopped draining and the infection is gone. AFTER THE PROCEDURE  You will be taken to a recovery area where you will stay until the medicines have worn off.  You will stay in bed for several hours.  Your progress will be monitored.   Your blood pressure and pulse will be checked often.   The area of the incision will be checked often.  You may have some pain or feel sick. Tell your health care provider.  As you begin to feel better, you may be given ice,  fluids, and food.   When you can walk, drink, eat, and use the bathroom, you may be able to go home. This information is not intended to replace advice given to you by your health care provider. Make sure you discuss any questions you have with your health care provider. Document Released: 06/29/2013 Document Reviewed: 06/29/2013 Elsevier Interactive Patient Education  2017 [place].  Pain medication instructions:  How can pain medicine affect me?  You were given a prescription for pain medicine.  This medicine may make you tired or drowsy and may affect your ability to think clearly. Pain medicine may also affect your ability to drive or perform certain physical activities. It may not be possible to make all of your pain go away, but you should be comfortable enough to move, breathe, and take care of yourself. How often should I take pain medicine and how much should I take?  Take pain medicine only as directed by your health care provider and only as needed for pain.  You do not need to take pain medicine if you are not having pain, unless directed by your health care provider.  You can take less than the prescribed dose if you find that a smaller amount of medicine controls your pain. What restrictions do I have while taking pain medicine? Follow these instructions after you start taking pain medicine, while you are taking the medicine, and for 8 hours after you stop taking the medicine:  Do not drive.  Do not operate machinery.  Do not operate power tools.  Do not sign legal documents.  Do not drink alcohol.  Do not take sleeping pills.  Do not supervise children by yourself.  Do not participate in activities that require climbing or being in high places.  Do not enter a body of water--such as a lake, river, ocean, spa, or swimming pool--without an adult nearby who can monitor and help you. How can I keep others safe while I am taking pain medicine?  Store your pain medicine as directed by your health care provider. Make sure that it is placed where children and pets cannot reach it.  Never share your pain medicine with anyone.  Do not save any leftover pills. If you have any leftover pain medicine, get rid of it or destroy it as directed by your health care provider. What else do I need to know about taking pain medicine?  Use a stool softener if you become constipated from your pain medicine. Increasing your intake of fruits and vegetables will also help with  constipation.  Write down the times when you take your pain medicine. Look at the times before you take your next dose of medicine. It is easy to become confused while on pain medicine. Recording the times helps you to avoid an overdose.  If your pain is severe, do not try to treat it yourself by taking more pills than instructed on your prescription. Contact your health care provider for help.  You may have been prescribed a pain medicine that contains acetaminophen. Do not take any other acetaminophen while taking this medicine. An overdose of acetaminophen can result in severe liver damage. Acetaminophen is found in many over-the-counter (OTC) and prescription medicines. If you are taking any medicines in addition to your pain medicine, check the active ingredients on those medicines to see if acetaminophen is listed. When should I call my health care provider?  Your medicine is not helping to make the pain go away.  You  vomit or have diarrhea shortly after taking the medicine.  You develop new pain in areas that did not hurt before.  You have an allergic reaction to your medicine. This may include: ? Itchiness. ? Swelling. ? Dizziness. ? Developing a new rash. When should I call 911 or go to the emergency room?  You feel dizzy or you faint.  You are very confused or disoriented.  You repeatedly vomit.  Your skin or lips turn pale or bluish in color.  You have shortness of breath or you are breathing much more slowly than usual.  You have a severe allergic reaction to your medicine. This includes: ? Developing tongue swelling. ? Having difficulty breathing. This information is not intended to replace advice given to you by your health care provider. Make sure you discuss any questions you have with your health care provider. Document Released: 05/21/2000 Document Revised: 09/02/2015 Document Reviewed: 12/17/2013 Elsevier Interactive Patient Education  2017 [place].   Additional discharge instructions:  Please get your medications reviewed and adjusted by your Primary MD.  Please request your Primary MD to go over all Hospital Tests and Procedure/Radiological results at the follow up, please get all Hospital records sent to your Prim MD by signing hospital release before you go home.  If you had Pneumonia of Lung problems at the Hospital: Please get a 2 view Chest X ray done in 6-8 weeks after hospital discharge or sooner if instructed by your Primary MD.  If you have Congestive Heart Failure: Please call your Cardiologist or Primary MD anytime you have any of the following symptoms:  1) 3 pound weight gain in 24 hours or 5 pounds in 1 week  2) shortness of breath, with or without a dry hacking cough  3) swelling in the hands, feet or stomach  4) if you have to sleep on extra pillows at night in order to breathe  Follow cardiac low salt diet and 1.5 lit/day fluid restriction.  If you have diabetes Accuchecks 4 times/day, Once in AM empty stomach and then before each meal. Log in all results and show them to your primary doctor at your next visit. If any glucose reading is under 80 or above 300 call your primary MD immediately.  If you have Seizure/Convulsions/Epilepsy: Please do not drive, operate heavy machinery, participate in activities at heights or participate in high speed sports until you have seen by Primary MD or a Neurologist and advised to do so again.  If you had Gastrointestinal Bleeding: Please ask your Primary MD to check a complete blood count within one week of discharge or at your next visit. Your endoscopic/colonoscopic biopsies that are pending at the time of discharge, will also need to followed by your Primary MD.  Get Medicines reviewed and adjusted. Please take all your medications with you for your next visit with your Primary MD  Please request your Primary MD to go over all hospital tests and  procedure/radiological results at the follow up, please ask your Primary MD to get all Hospital records sent to his/her office.  If you experience worsening of your admission symptoms, develop shortness of breath, life threatening emergency, suicidal or homicidal thoughts you must seek medical attention immediately by calling 911 or calling your MD immediately  if symptoms less severe.  You must read complete instructions/literature along with all the possible adverse reactions/side effects for all the Medicines you take and that have been prescribed to you. Take any new Medicines after you  have completely understood and accpet all the possible adverse reactions/side effects.   Do not drive or operate heavy machinery when taking Pain medications.   Do not take more than prescribed Pain, Sleep and Anxiety Medications  Special Instructions: If you have smoked or chewed Tobacco  in the last 2 yrs please stop smoking, stop any regular Alcohol  and or any Recreational drug use.  Wear Seat belts while driving.  Please note You were cared for by a hospitalist during your hospital stay. If you have any questions about your discharge medications or the care you received while you were in the hospital after you are discharged, you can call the unit and asked to speak with the hospitalist on call if the hospitalist that took care of you is not available. Once you are discharged, your primary care physician will handle any further medical issues. Please note that NO REFILLS for any discharge medications will be authorized once you are discharged, as it is imperative that you return to your primary care physician (or establish a relationship with a primary care physician if you do not have one) for your aftercare needs so that they can reassess your need for medications and monitor your lab values.  You can reach the hospitalist office at phone [phone] or fax [phone]   If you do not have a primary  care physician, you can call [phone] for a physician referral.

## 2016-03-27 NOTE — Consult Note (Addendum)
[facility]  Total days of antibiotics 13        Day 4 ceftriaxone               Reason for Consult: abd abscess   Referring Physician: [email or web address]  Principal Problem:   Crohn's colitis, with abscess ([place]) Active Problems:   Crohn's colitis ([place])   Leukocytosis   Anemia   Abnormal CT of the abdomen   Crohn's disease of ileum with abscess ([place])   Feeding difficulties   Abdominal abscess (HCC)  HPI: Larry [name] is a 36 y.o. male with history of crohn's colitis previously on azathioprine and low dose steroids 32m daily. He ws admitted on 1/18 for right lower quadrant abdominal pain found to have imaging abn of active crohn's flare of terminal ileum and cecum as well as multiple abscess including large abscess measuring 2.8 x 4.7cm. He was initially stared on piptazo but still had ongoing fever on 1/23 and 1/24, where decision was made to do IR drain placement to help drain abscess. Cx grew E.coli (amp R, amp/sub I, cefazolin S, ctx S, cefepime S, cipro R, imi S, piptazo S) but also identified saccharomyces cerevisiae. His abtx have been switched to ceftriaxone. He is no longer febrile. Wbc now at 10. He has picc line placed. ID asked to consult in regards to length of treatment.  I have independently review CT images from 1/29 vs. 1/23, it appears that fluid collection where drain has been placed is much reduced in size. Still has a few fluid collection in nearby region and in psoas m.  He had hx of abdominal abscess in 2015 where he needed a drain and iv abtx, no other GI surgeries.  Soc hx: he is married, his wife is due tomorrow via scheduled csection with their first child  Past Medical History:  Diagnosis Date  . Crohn's disease ([place]     Allergies: No Known Allergies  MEDICATIONS: . anidulafungin  200 mg Intravenous Q24H  . cefTRIAXone (ROCEPHIN)  IV  2 g Intravenous Q24H  . enoxaparin (LOVENOX) injection  40 mg Subcutaneous Q24H  . multivitamin  with minerals  1 tablet Oral Daily  . pantoprazole  40 mg Oral Daily  . saccharomyces boulardii  250 mg Oral BID  . sodium chloride flush  10-40 mL Intracatheter Q12H    Social History  Substance Use Topics  . Smoking status: Never Smoker  . Smokeless tobacco: Never Used  . Alcohol use No    Family hx: no family history of crohns disease. +htn, ckd,   Review of Systems  Constitutional: Negative for fever, chills, diaphoresis, activity change, appetite change, fatigue and unexpected weight change.  HENT: Negative for congestion, sore throat, rhinorrhea, sneezing, trouble swallowing and sinus pressure.  Eyes: Negative for photophobia and visual disturbance.  Respiratory: Negative for cough, chest tightness, shortness of breath, wheezing and stridor.  Cardiovascular: Negative for chest pain, palpitations and leg swelling.  Gastrointestinal: mild abdominal discomfort. Negative for nausea, vomiting, abdominal pain, diarrhea, constipation, blood in stool, abdominal distention and anal bleeding.  Genitourinary: Negative for dysuria, hematuria, flank pain and difficulty urinating.  Musculoskeletal: Negative for myalgias, back pain, joint swelling, arthralgias and gait problem.  Skin: Negative for color change, pallor, rash and wound.  Neurological: Negative for dizziness, tremors, weakness and light-headedness.  Hematological: Negative for adenopathy. Does not bruise/bleed easily.  Psychiatric/Behavioral: Negative for behavioral problems, confusion, sleep disturbance, dysphoric mood, decreased concentration and agitation.   OBJECTIVE:  Temp:  [97.9 F (36.6 C)-99.2 F (37.3 C)] 97.9 F (36.6 C) (01/30 0423) Pulse Rate:  [90-91] 90 (01/30 0423) Resp:  [16-18] 18 (01/30 0423) BP: (97-108)/(56-65) 108/65 (01/30 0423) SpO2:  [97 %-98 %] 98 % (01/30 0423) Physical Exam  Constitutional: He is oriented to person, place, and time. He appears well-developed and well-nourished. No distress.    HENT:  Mouth/Throat: Oropharynx is clear and moist. No oropharyngeal exudate.  Cardiovascular: Normal rate, regular rhythm and normal heart sounds. Exam reveals no gallop and no friction rub.  No murmur heard.  Pulmonary/Chest: Effort normal and breath sounds normal. No respiratory distress. He has no wheezes.  Abdominal: Soft. Bowel sounds are normal. He exhibits no distension. There is no tenderness. Right jp drain with opaque whitish fluid Lymphadenopathy:  He has no cervical adenopathy.  Neurological: He is alert and oriented to person, place, and time.  Skin: Skin is warm and dry. No rash noted. No erythema.  Psychiatric: He has a normal mood and affect. His behavior is normal.    LABS: Results for orders placed or performed during the hospital encounter of 03/16/16 (from the past 48 hour(s))  Glucose, capillary     Status: Abnormal   Collection Time: 03/25/16  3:25 PM  Result Value Ref Range   Glucose-Capillary 118 (H) 65 - 99 mg/dL  Glucose, capillary     Status: Abnormal   Collection Time: 03/25/16  9:59 PM  Result Value Ref Range   Glucose-Capillary 149 (H) 65 - 99 mg/dL   Comment 1 Notify RN   Glucose, capillary     Status: Abnormal   Collection Time: 03/26/16  6:21 AM  Result Value Ref Range   Glucose-Capillary 122 (H) 65 - 99 mg/dL   Comment 1 Notify RN   Comprehensive metabolic panel     Status: Abnormal   Collection Time: 03/26/16  8:44 AM  Result Value Ref Range   Sodium 139 135 - 145 mmol/L   Potassium 3.9 3.5 - 5.1 mmol/L   Chloride 107 101 - 111 mmol/L   CO2 27 22 - 32 mmol/L   Glucose, Bld 134 (H) 65 - 99 mg/dL   BUN 13 6 - 20 mg/dL   Creatinine, Ser 0.80 0.61 - 1.24 mg/dL   Calcium 8.8 (L) 8.9 - 10.3 mg/dL   Total Protein 7.5 6.5 - 8.1 g/dL   Albumin 2.9 (L) 3.5 - 5.0 g/dL   AST 18 15 - 41 U/L   ALT 20 17 - 63 U/L   Alkaline Phosphatase 46 38 - 126 U/L   Total Bilirubin 0.2 (L) 0.3 - 1.2 mg/dL   GFR calc non Af Amer >60 >60 mL/min   GFR calc Af  Amer >60 >60 mL/min    Comment: (NOTE) The eGFR has been calculated using the CKD EPI equation. This calculation has not been validated in all clinical situations. eGFR's persistently <60 mL/min signify possible Chronic Kidney Disease.    Anion gap 5 5 - 15  Magnesium     Status: None   Collection Time: 03/26/16  8:44 AM  Result Value Ref Range   Magnesium 1.9 1.7 - 2.4 mg/dL  Phosphorus     Status: None   Collection Time: 03/26/16  8:44 AM  Result Value Ref Range   Phosphorus 3.5 2.5 - 4.6 mg/dL  CBC     Status: Abnormal   Collection Time: 03/26/16  8:44 AM  Result Value Ref Range   WBC 10.2 4.0 - 10.5 K/uL  RBC 3.93 (L) 4.22 - 5.81 MIL/uL   Hemoglobin 10.1 (L) 13.0 - 17.0 g/dL   HCT 32.2 (L) 39.0 - 52.0 %   MCV 81.9 78.0 - 100.0 fL   MCH 25.7 (L) 26.0 - 34.0 pg   MCHC 31.4 30.0 - 36.0 g/dL   RDW 14.3 11.5 - 15.5 %   Platelets 381 150 - 400 K/uL  Differential     Status: Abnormal   Collection Time: 03/26/16  8:44 AM  Result Value Ref Range   Neutrophils Relative % 77 %   Neutro Abs 7.8 (H) 1.7 - 7.7 K/uL   Lymphocytes Relative 11 %   Lymphs Abs 1.2 0.7 - 4.0 K/uL   Monocytes Relative 9 %   Monocytes Absolute 0.9 0.1 - 1.0 K/uL   Eosinophils Relative 3 %   Eosinophils Absolute 0.3 0.0 - 0.7 K/uL   Basophils Relative 0 %   Basophils Absolute 0.0 0.0 - 0.1 K/uL  Triglycerides     Status: None   Collection Time: 03/26/16  8:44 AM  Result Value Ref Range   Triglycerides 103 <150 mg/dL    Comment: Performed at [facility], [place] [address] [place], [place] [address]  Prealbumin     Status: None   Collection Time: 03/26/16 10:40 AM  Result Value Ref Range   Prealbumin 22.5 18 - 38 mg/dL    Comment: Performed at [facility] [address] [place], [place] [address]  Glucose, capillary     Status: Abnormal   Collection Time: 03/26/16  3:08 PM  Result Value Ref Range   Glucose-Capillary 100 (H) 65 - 99 mg/dL    MICRO:  RARE ESCHERICHIA COLI  FEW  SACCHAROMYCES CEREVISIAE  NO ANAEROBES ISOLATED  Performed at [facility], [place] [address] [place], [place] [address]      Report Status 03/26/2016 FINAL   Organism ID, Bacteria ESCHERICHIA COLI   Resulting Agency [facility]  Susceptibility    Escherichia coli    MIC    AMPICILLIN >=32 RESIST... Resistant    AMPICILLIN/SULBACTAM 16 INTERMED... Intermediate    CEFAZOLIN <=4 SENSITIVE "><=4 SENSITIVE  Sensitive    CEFEPIME <=1 SENSITIVE "><=1 SENSITIVE  Sensitive    CEFTAZIDIME <=1 SENSITIVE "><=1 SENSITIVE  Sensitive    CEFTRIAXONE <=1 SENSITIVE "><=1 SENSITIVE  Sensitive    CIPROFLOXACIN >=4 RESISTANT  Resistant    Extended ESBL NEGATIVE  Sensitive    GENTAMICIN <=1 SENSITIVE "><=1 SENSITIVE  Sensitive    IMIPENEM <=0.25 SENSITIVE "><=0.25 SENS... Sensitive    PIP/TAZO <=4 SENSITIVE "><=4 SENSITIVE  Sensitive    TRIMETH/SULFA <=20 SENSITIVE "><=20 SENSIT... Sensitive         Susceptibility Comments   Escherichia coli  RARE ESCHERICHIA COLI      IMAGING: Ct Abdomen Pelvis W Contrast  Result Date: 03/26/2016 CLINICAL DATA:  Status post drain placement 03/21/2016 for right lower quadrant abscess. Crohn colitis. EXAM: CT ABDOMEN AND PELVIS WITH CONTRAST TECHNIQUE: Multidetector CT imaging of the abdomen and pelvis was performed using the standard protocol following bolus administration of intravenous contrast. CONTRAST:  143m ISOVUE-300 IOPAMIDOL (ISOVUE-300) INJECTION 61% COMPARISON:  03/20/2016 FINDINGS: Lower chest: Clear lung bases. Normal heart size without pericardial or pleural effusion. An incompletely image central line. Hepatobiliary: Normal liver. Normal gallbladder, without biliary ductal dilatation. Pancreas: Normal, without mass or ductal dilatation. Spleen: Normal in size, without focal abnormality. Adrenals/Urinary Tract: Normal adrenal glands. Normal kidneys, without hydronephrosis. Normal urinary bladder. Stomach/Bowel: Normal stomach, without  wall thickening.  Normal colon. Terminal and distal ileal wall thickening is again identified. Felt to be slightly improved. The appendix is dilated and hyperenhancing including on image 56/series 2. This is chronic and favored to be secondary. fluid and gas collection medial to the terminal ileum measures 1.6 cm on image 61/series 2. A more inferior and anterior collection a 2.0 cm on image 67/series 2. These are new. More lateral fluid collections are decreased in size, status post drain placement. Collection along the right psoas muscle is nearly completely resolved, measuring 2.1 cm maximally on image 73/series 2. Compare 5.2 x 3.5 cm on the prior exam (when remeasured). More anterior right-sided collection measures 2.0 cm on image 71/series 2 versus 3.0 cm on the prior exam (when remeasured). More proximal small bowel loops are normal in caliber. Vascular/Lymphatic: Normal caliber of the aorta and branch vessels. No abdominopelvic adenopathy. Reproductive: Normal prostate. Other: No significant free fluid. Musculoskeletal: No acute osseous abnormality. IMPRESSION: 1. Terminal ileitis, likely mildly improved. Interval decrease in size of lateral right lower quadrant abscesses, status post drain placement. 2. Developing smaller fluid collections medially, suspicious for mesenteric abscesses. Electronically Signed   By: Abigail Miyamoto M.D.   On: 03/26/2016 12:07   Assessment/Plan:  36yo M with crohns flare who was admitted for flare with multiple abdominal abscess s/p drain, cultures are polymicrobial including yeast.  - recommend to treat with minimum of 14 days of ceftriaxone 2gm IV daily plus caspofungin to cover polymicrobial infection (will give 1 dose of anidulafungin today) - will check sed rate and crp - will need follow up CT in 2 wk to see if still needs drain and to see if fluid collections has resolved  Crohn's disease = imuran is on hold til he sees dr Enis Gash on feb 13th. Recommend to check quantiferon again  as an outpatient. If his repeat imaging shows resolution of abd abscess then he will not need further oral abtx.  Leukocytosis = resolved  Intermediate results on quantiferon testing = recommend to repeat as outpatient  Health maintenance - recommend HIV ab testing and Hep C testing  Spoke with Dr Algis Liming and coordinated care with home health for IV abtx delivery/teach of drain and management.   Spent 110 min with patient with greater than 50% in coordination of care as mentioned above.   ------------------------Home health orders listed below-----------------  Diagnosis: Intra-abdominal infection  Culture Result:  E.coli and saccharomyces   No Known Allergies  Discharge antibiotics: Per pharmacy protocol ceftriaxone 2gm IV daily plus caspofungin  Duration: 14 days End Date: Feb 13th  [facility] Care Per Protocol:  Labs weekly while on IV antibiotics: x__ CBC with differential _x_ BMP __ CMP __x CRP x__ ESR    _x_ Please pull PIC in place at end of abtx  Fax weekly labs to [phone]  Clinic Follow Up Appt: Feb 15th  @ [facility] with [facility]

## 2016-03-27 NOTE — Progress Notes (Signed)
Patient ID: Larry [name], male   DOB: 05-25-1980, 36 y.o.   MRN: [id number]     Subjective: No C/O other than pain at drain site.  Objective: Vital signs in last 24 hours: Temp:  [97.9 F (36.6 C)-99.2 F (37.3 C)] 97.9 F (36.6 C) (01/30 0423) Pulse Rate:  [90-91] 90 (01/30 0423) Resp:  [16-18] 18 (01/30 0423) BP: (97-108)/(56-65) 108/65 (01/30 0423) SpO2:  [97 %-98 %] 98 % (01/30 0423) Last BM Date: 03/26/16  Intake/Output from previous day: 01/29 0701 - 01/30 0700 In: 1319.3 [P.O.:600; I.V.:659.3; IV Piggyback:50] Out: 820 [Urine:800; Drains:20] Intake/Output this shift: No intake/output data recorded.  General appearance: alert, cooperative and no distress GI: normal findings: soft, non-tender and Drain with slight purulent/bloody drainage  Lab Results:   Recent Labs  03/26/16 0844  WBC 10.2  HGB 10.1*  HCT 32.2*  PLT 381   BMET  Recent Labs  03/26/16 0844  NA 139  K 3.9  CL 107  CO2 27  GLUCOSE 134*  BUN 13  CREATININE 0.80  CALCIUM 8.8*     Studies/Results: Ct Abdomen Pelvis W Contrast  Result Date: 03/26/2016 CLINICAL DATA:  Status post drain placement 03/21/2016 for right lower quadrant abscess. Crohn colitis. EXAM: CT ABDOMEN AND PELVIS WITH CONTRAST TECHNIQUE: Multidetector CT imaging of the abdomen and pelvis was performed using the standard protocol following bolus administration of intravenous contrast. CONTRAST:  128m ISOVUE-300 IOPAMIDOL (ISOVUE-300) INJECTION 61% COMPARISON:  03/20/2016 FINDINGS: Lower chest: Clear lung bases. Normal heart size without pericardial or pleural effusion. An incompletely image central line. Hepatobiliary: Normal liver. Normal gallbladder, without biliary ductal dilatation. Pancreas: Normal, without mass or ductal dilatation. Spleen: Normal in size, without focal abnormality. Adrenals/Urinary Tract: Normal adrenal glands. Normal kidneys, without hydronephrosis. Normal urinary bladder. Stomach/Bowel: Normal stomach,  without wall thickening. Normal colon. Terminal and distal ileal wall thickening is again identified. Felt to be slightly improved. The appendix is dilated and hyperenhancing including on image 56/series 2. This is chronic and favored to be secondary. fluid and gas collection medial to the terminal ileum measures 1.6 cm on image 61/series 2. A more inferior and anterior collection a 2.0 cm on image 67/series 2. These are new. More lateral fluid collections are decreased in size, status post drain placement. Collection along the right psoas muscle is nearly completely resolved, measuring 2.1 cm maximally on image 73/series 2. Compare 5.2 x 3.5 cm on the prior exam (when remeasured). More anterior right-sided collection measures 2.0 cm on image 71/series 2 versus 3.0 cm on the prior exam (when remeasured). More proximal small bowel loops are normal in caliber. Vascular/Lymphatic: Normal caliber of the aorta and branch vessels. No abdominopelvic adenopathy. Reproductive: Normal prostate. Other: No significant free fluid. Musculoskeletal: No acute osseous abnormality. IMPRESSION: 1. Terminal ileitis, likely mildly improved. Interval decrease in size of lateral right lower quadrant abscesses, status post drain placement. 2. Developing smaller fluid collections medially, suspicious for mesenteric abscesses. Electronically Signed   By: KAbigail MiyamotoM.D.   On: 03/26/2016 12:07    Anti-infectives: Anti-infectives    Start     Dose/Rate Route Frequency Ordered Stop   03/24/16 1200  cefTRIAXone (ROCEPHIN) 2 g in dextrose 5 % 50 mL IVPB     2 g 100 mL/hr over 30 Minutes Intravenous Every 24 hours 03/24/16 1133     03/16/16 1400  piperacillin-tazobactam (ZOSYN) IVPB 3.375 g  Status:  Discontinued     3.375 g 12.5 mL/hr over 240 Minutes Intravenous Every 8  hours 03/16/16 0548 03/24/16 1133   03/16/16 0415  piperacillin-tazobactam (ZOSYN) IVPB 3.375 g     3.375 g 12.5 mL/hr over 240 Minutes Intravenous  Once 03/16/16  0411 03/16/16 0844      Assessment/Plan: Crohn's Dz with psoas abscess, S/P perc drainage.  Steady improvement.  Tolerating diet. Cont current Rx. Repeat CT yesterday with improvement, a couple small fluid collections not drainable.He can potentially be transitioned to PO abx (cipro/flagyl or augmentin or other with enteric coverage)    LOS: 11 days    [name] 03/27/2016

## 2016-03-27 NOTE — Progress Notes (Signed)
Discharge instructions given, regarding care of Larry [name] and picc line, patient verbalized understanding of instructions.

## 2016-03-28 ENCOUNTER — Other Ambulatory Visit: Payer: Self-pay | Admitting: General Surgery

## 2016-03-28 DIAGNOSIS — K651 Peritoneal abscess: Secondary | ICD-10-CM

## 2016-03-28 LAB — HIV ANTIBODY (ROUTINE TESTING W REFLEX): HIV Screen 4th Generation wRfx: NONREACTIVE

## 2016-03-28 LAB — HEPATITIS C ANTIBODY: HCV Ab: <0.1 s/co ratio (ref 0.0–0.9)

## 2016-03-30 ENCOUNTER — Telehealth: Payer: Self-pay | Admitting: Gastroenterology

## 2016-03-30 NOTE — Telephone Encounter (Signed)
A user error has taken place.

## 2016-04-03 NOTE — Telephone Encounter (Signed)
No, I have not seen anything for this patient. Sorry.

## 2016-04-04 NOTE — Telephone Encounter (Signed)
Ok. I have not seen any FMLA paperwork for this pt. I called pt and he states that his mom was going to have forms faxed here. He also states that he signed a medical release. I informed pt that [employer] completes FMLA paperwork for [place] and that there is a $25 fee for completion. Pt understood. He was given [place] telephone [phone].

## 2016-04-05 ENCOUNTER — Encounter: Payer: Self-pay | Admitting: *Deleted

## 2016-04-05 ENCOUNTER — Ambulatory Visit
Admission: RE | Admit: 2016-04-05 | Discharge: 2016-04-05 | Disposition: A | Discharge status: Home / Self Care | Payer: BLUE CROSS/BLUE SHIELD | Source: Ambulatory Visit | Attending: General Surgery | Admitting: General Surgery

## 2016-04-05 ENCOUNTER — Ambulatory Visit
Admission: RE | Admit: 2016-04-05 | Discharge: 2016-04-05 | Disposition: A | Discharge status: Home / Self Care | Payer: BLUE CROSS/BLUE SHIELD | Source: Ambulatory Visit | Attending: Radiology | Admitting: Radiology

## 2016-04-05 DIAGNOSIS — K651 Peritoneal abscess: Secondary | ICD-10-CM

## 2016-04-05 HISTORY — PX: IR GENERIC HISTORICAL: IMG1180011

## 2016-04-05 MED ORDER — IOPAMIDOL (ISOVUE-300) INJECTION 61%
100.0000 mL | Freq: Once | INTRAVENOUS | Status: AC | PRN
  Administered 2016-04-05: 100 mL via INTRAVENOUS

## 2016-04-05 NOTE — Progress Notes (Signed)
Patient ID: Larry [name], male   DOB: 10/18/1980, 36 y.o.   MRN: [id number]         Chief Complaint: History of Crohn's disease, post percutaneous drainage catheter placement  Referring Physician(s): H[facility]worth ([facility])  History of Present Illness: Larry [name] is a 36 y.o. male with past medical history significant for Crohn's colitis completed by development of an abscess within the right iliac is musculature, post Perkins drainage catheter placement on 03/21/2016 by Dr. Barbie Banner. Patient presents to the interventional radiology drain clinic today for percutaneous drainage catheter evaluation and management.  The patient is unaccompanied and serves as his own historian.  The patient continues to flush the percutaneous drainage catheter twice a day. Patient reports approximately 25 mL of output from the percutaneous drainage catheter though states this is mostly secondary to the flushing solution. The patient continues to receive intravenous antibiotics via his PICC line.  Patient currently denies fever or chills. Patient reports persistent right lower quadrant abdominal pain though states this is improved recently.   Past Medical History:  Diagnosis Date  . Crohn's disease [facility])     Past Surgical History:  Procedure Laterality Date  . bowel abscess from crohns     . IR GENERIC HISTORICAL  04/05/2016   IR RADIOLOGIST EVAL & MGMT 04/05/2016 Sandi Mariscal, MD [facility] INTERV RAD    Allergies: Patient has no known allergies.  Medications: Prior to Admission medications   Medication Sig Start Date End Date Taking? Authorizing Provider  acetaminophen (TYLENOL) 325 MG tablet Take 2 tablets (650 mg total) by mouth every 6 (six) hours as needed for mild pain, moderate pain or fever. 03/27/16   Modena Jansky, MD  caspofungin 50 mg in sodium chloride 0.9 % 250 mL Inject 50 mg into the vein daily. Stop after 04/10/2016 dose. 03/28/16   Modena Jansky, MD  cefTRIAXone 2 g in dextrose 5 % 50 mL Inject 2 g  into the vein daily. Stop after 04/10/16 dose. 03/28/16   Modena Jansky, MD  oxyCODONE (OXY IR/ROXICODONE) 5 MG immediate release tablet Take 1 tablet (5 mg total) by mouth every 8 (eight) hours as needed for severe pain or breakthrough pain. 03/27/16   Modena Jansky, MD  saccharomyces boulardii (FLORASTOR) 250 MG capsule Take 1 capsule (250 mg total) by mouth 2 (two) times daily. 03/27/16   Modena Jansky, MD  Sodium Chloride Flush (SALINE FLUSH) 0.9 % SOLN RLQ JP drain care: rec once daily flush with 5- 10 mls sterile NS, recording of output and dressing changes twice daily. 03/27/16   Modena Jansky, MD     No family history on file.  Social History   Social History  . Marital status: Single    Spouse name: N/A  . Number of children: N/A  . Years of education: N/A   Social History Main Topics  . Smoking status: Never Smoker  . Smokeless tobacco: Never Used  . Alcohol use No  . Drug use: No  . Sexual activity: Not on file   Other Topics Concern  . Not on file   Social History Narrative  . No narrative on file    ECOG Status: 1 - Symptomatic but completely ambulatory  Review of Systems: A 12 point ROS discussed and pertinent positives are indicated in the HPI above.  All other systems are negative.  Review of Systems  Constitutional: Negative for activity change, appetite change, chills and fever.  Gastrointestinal: Positive for abdominal pain.  Vital Signs: BP 115/70 (BP Location: Left Arm)   Pulse 91   Temp 98.2 F (36.8 C) (Oral)   SpO2 100%   Physical Exam  Constitutional: He appears well-developed and well-nourished.  HENT:  Head: Normocephalic and atraumatic.  Abdominal:    Location of the right lower quadrant percutaneous drainage catheter. Review of CT scan of the abdomen and pelvis performed earlier today demonstrates  Skin: Skin is warm and dry.  Psychiatric: He has a normal mood and affect. His behavior is normal.    Imaging: Ct Abdomen  Pelvis W Contrast  Result Date: 04/05/2016 CLINICAL DATA:  History of Crohn's colitis, complicated by development of an abscess within the right iliacus musculature, post post percutaneous drainage catheter placement on 03/21/2016. Patient presents today to the interventional radiology drain Clinic for percutaneous drainage catheter evaluation and management. Patient reports approximately 25 cc of fluid output per day from the percutaneous drainage catheter. The patient continues to flush the percutaneous drainage catheter twice a day. The patient continues on intravenous antibiotics. EXAM: CT ABDOMEN AND PELVIS WITH CONTRAST TECHNIQUE: Multidetector CT imaging of the abdomen and pelvis was performed using the standard protocol following bolus administration of intravenous contrast. CONTRAST:  166m ISOVUE-300 IOPAMIDOL (ISOVUE-300) INJECTION 61% COMPARISON:  CT abdomen pelvis - 03/26/2016; 03/20/2016; CT-guided percutaneous drainage catheter placement - 03/21/2016 FINDINGS: Lower chest: Limited visualization of lower thorax is negative for focal airspace opacity or pleural effusion. Normal heart size.  No pericardial effusion. Hepatobiliary: Normal hepatic contour. No discrete hepatic lesions. Normal appearance of the gallbladder. No radiopaque gallstones. No intra extrahepatic bili duct dilatation. No ascites. Pancreas: Normal appearance of the pancreas. Spleen: Normal appearance of the spleen. Adrenals/Urinary Tract: There is symmetric enhancement of the bilateral kidneys. No renal stones. No urine obstruction or perinephric stranding. Normal appearance of the bilateral adrenal glands. Normal appearance of the urinary bladder given degree distention. Stomach/Bowel: Unchanged positioning of percutaneous drainage catheter with end coiled and locked within the right iliacus musculature. Dominant iliacus musculature abscess has resolved. Additional peripherally enhancing though ill-defined serpiginous fluid  collection within the more caudal peripheral aspect of the right iliopsoas musculature has decreased in size the interval, currently measuring 1.3 x 0.8 cm (image 61, series 2), previously, 2.0 x 1.2 cm. Re- demonstrated circumferential wall thickening involving the terminal ileum extending to involve the cecum (best seen on coronal images 38 through 42, series 601), compatible provided history of Crohn's disease. These findings are associated with adjacent mesenteric stranding. Previously questioned tiny interloop fluid collections are not less well demonstrated on the present examination. No new definable/drainable intra- abdominal fluid collections. Secondary inflammation again encompasses the retrocecal appendix however air is seen throughout the entirety of the appendix to the level of the appendiceal tip. Large colonic stool burden. Mild patulous distension of several loops of distal small bowel with index loop of small bowel within the midline of the pelvis measuring approximately 4.9 cm, similar to the 03/26/2016 examination and again without definite evidence of enteric obstruction. Vascular/Lymphatic: Normal caliber the abdominal aorta. The major branch vessels of the abdominal aorta appear patent on this non CTA examination. Scattered retroperitoneal lymph nodes are numerous though individually not enlarged by size criteria. No bulky retroperitoneal, mesenteric, pelvic or inguinal lymphadenopathy. Reproductive: Normal appearance of the pelvic organs. There is a small amount of free fluid in the pelvic cul-de-sac. Other: Regional soft tissues appear normal. Musculoskeletal: No acute or aggressive osseous abnormalities. Moderate-sized posteriorly directed disc osteophyte complex at L5, S1, unchanged. IMPRESSION:  1. Resolved dominant component of right iliacus intramuscular abscess following percutaneous drainage catheter placement. Smaller serpiginous ill-defined fluid collection within the peripheral  aspect of the right iliopsoas musculature has decreased in size in the interval, currently measuring 1.3 cm in diameter, previously, 2.0 cm. 2. Similar findings of terminal ileitis compatible with provided history of Crohn's disease. Previously questioned interloop fluid collections are less conspicuous on the present examination. 3. Similar findings of mild patulous distension of the distal small bowel without evidence of enteric obstruction. PLAN: Patient will undergo percutaneous drainage catheter injection prior to consideration of percutaneous drainage catheter removal. Electronically Signed   By: Sandi Mariscal M.D.   On: 04/05/2016 14:24   Ct Abdomen Pelvis W Contrast  Result Date: 03/26/2016 CLINICAL DATA:  Status post drain placement 03/21/2016 for right lower quadrant abscess. Crohn colitis. EXAM: CT ABDOMEN AND PELVIS WITH CONTRAST TECHNIQUE: Multidetector CT imaging of the abdomen and pelvis was performed using the standard protocol following bolus administration of intravenous contrast. CONTRAST:  115m ISOVUE-300 IOPAMIDOL (ISOVUE-300) INJECTION 61% COMPARISON:  03/20/2016 FINDINGS: Lower chest: Clear lung bases. Normal heart size without pericardial or pleural effusion. An incompletely image central line. Hepatobiliary: Normal liver. Normal gallbladder, without biliary ductal dilatation. Pancreas: Normal, without mass or ductal dilatation. Spleen: Normal in size, without focal abnormality. Adrenals/Urinary Tract: Normal adrenal glands. Normal kidneys, without hydronephrosis. Normal urinary bladder. Stomach/Bowel: Normal stomach, without wall thickening. Normal colon. Terminal and distal ileal wall thickening is again identified. Felt to be slightly improved. The appendix is dilated and hyperenhancing including on image 56/series 2. This is chronic and favored to be secondary. fluid and gas collection medial to the terminal ileum measures 1.6 cm on image 61/series 2. A more inferior and anterior  collection a 2.0 cm on image 67/series 2. These are new. More lateral fluid collections are decreased in size, status post drain placement. Collection along the right psoas muscle is nearly completely resolved, measuring 2.1 cm maximally on image 73/series 2. Compare 5.2 x 3.5 cm on the prior exam (when remeasured). More anterior right-sided collection measures 2.0 cm on image 71/series 2 versus 3.0 cm on the prior exam (when remeasured). More proximal small bowel loops are normal in caliber. Vascular/Lymphatic: Normal caliber of the aorta and branch vessels. No abdominopelvic adenopathy. Reproductive: Normal prostate. Other: No significant free fluid. Musculoskeletal: No acute osseous abnormality. IMPRESSION: 1. Terminal ileitis, likely mildly improved. Interval decrease in size of lateral right lower quadrant abscesses, status post drain placement. 2. Developing smaller fluid collections medially, suspicious for mesenteric abscesses. Electronically Signed   By: KAbigail MiyamotoM.D.   On: 03/26/2016 12:07   Ct Abdomen Pelvis W Contrast  Result Date: 03/20/2016 CLINICAL DATA:  Right lower quadrant abdominal pain. History of Crohn' s disease. EXAM: CT ABDOMEN AND PELVIS WITH CONTRAST TECHNIQUE: Multidetector CT imaging of the abdomen and pelvis was performed using the standard protocol following bolus administration of intravenous contrast. CONTRAST:  826mISOVUE-300 IOPAMIDOL (ISOVUE-300) INJECTION 61% COMPARISON:  03/16/2016 CT scan FINDINGS: Lower chest: Linear subsegmental atelectasis in the posterior basal segment right lower lobe. Density in the cavoatrial junction is probably a central line, correlate with patient history. Hepatobiliary: Unremarkable Pancreas: Unremarkable Spleen: Unremarkable Adrenals/Urinary Tract: Unremarkable Stomach/Bowel: Similar to prior there is a 14 cm region of severe wall thickening in the terminal ileum with abscess tracking along the right transverse abdominis muscle in the right  retroperitoneum to involve the right psoas muscle, volume of abscess approximately 80 cc and containing gas and  fluid with thick enhancing margins, this is increased in size compared to the prior exam. On images 56-57 of series 2 there small locules of extraluminal gas medial to the terminal ileum compatible with contained perforation or small microabscesses. Air-fluid level in the contrast in the rectum. Vascular/Lymphatic: Small reactive right inguinal and pelvic lymph nodes. Reproductive: Unremarkable Other: No supplemental non-categorized findings. Musculoskeletal: Sacroiliac joints unremarkable. Partially calcified annulus fibrosis along a disc bulge at L5-S1. IMPRESSION: 1. Severe wall thickening in the distal 14 cm of the terminal ileum, with small amounts of adjacent extraluminal loculated gas containing compatible with micro perforation, and with enlargement of the right transverse abdominis and psoas muscle abscess which is approximately 80 cc in volume. Electronically Signed   By: Van Clines M.D.   On: 03/20/2016 12:12   Ct Abdomen Pelvis W Contrast  Result Date: 03/16/2016 CLINICAL DATA:  Abdominal pain. Right lower quadrant pain. Weight loss. Crohn's disease. EXAM: CT ABDOMEN AND PELVIS WITH CONTRAST TECHNIQUE: Multidetector CT imaging of the abdomen and pelvis was performed using the standard protocol following bolus administration of intravenous contrast. CONTRAST:  142m ISOVUE-300 IOPAMIDOL (ISOVUE-300) INJECTION 61% COMPARISON:  [facility] FINDINGS: Lower chest: The lung bases are clear. Hepatobiliary: No focal liver abnormality is seen. No gallstones, gallbladder wall thickening, or biliary dilatation. Pancreas: No ductal dilatation or inflammation. Spleen: Normal in size without focal abnormality. Adrenals/Urinary Tract: Normal adrenal glands. Symmetric renal enhancement without hydronephrosis. Urinary bladder is physiologically  distended. Stomach/Bowel: Wall thickening involving the terminal ileum and cecum with moderate adjacent mesenteric inflammation consistent with active Crohn's. There multiple adjacent irregular fluid collections with peripheral enhancement in the right lower quadrant consistent with abscess. These involve both the adjacent fat and right iliopsoas muscle. A discrete collection anteriorly measures 2.8 x 1.7 cm. This may be contiguous with the more posterior collection measuring 2.5 x 4.7 cm, which involves the iliopsoas muscle. The appendix is tentatively identified containing intraluminal fluid with similar stranding to the adjacent inflamed cecum and ascending colon. No other bowel wall thickening to suggest additional sites of active Crohn's. Moderate stool in the more distal colon. Vascular/Lymphatic: No significant vascular findings are present. No enlarged abdominal or pelvic lymph nodes. Reproductive: Prostate is unremarkable. Other: Right lower quadrant abscesses as described. No pelvic free fluid. No free air. Musculoskeletal: Heterogeneous edema of the right iliopsoas muscle with small intramuscular abscess as described. Mild heterogeneous enlargement of the distal iliopsoas likely myositis. There are no acute or suspicious osseous abnormalities. IMPRESSION: Active Crohn's involving the terminal ileum and cecum. There are adjacent irregular abscesses in the right lower quadrant involving the adjacent fat and iliopsoas muscle. Fluid collections are irregular in shape, and measure approximately 2.8 and 4.7 cm respectively. Electronically Signed   By: MJeb LeveringM.D.   On: 03/16/2016 03:24   Ct Image Guided Drainage By Percutaneous Catheter  Result Date: 03/21/2016 INDICATION: Right lower quadrant abscess EXAM: CT GUIDED DRAINAGE OF A RIGHT LOWER QUADRANT ABSCESS MEDICATIONS: The patient is currently admitted to the hospital and receiving intravenous antibiotics. The antibiotics were administered  within an appropriate time frame prior to the initiation of the procedure. ANESTHESIA/SEDATION: mg IV Versed  mcg IV Fentanyl Moderate Sedation Time: The patient was continuously monitored during the procedure by the interventional radiology nurse under my direct supervision. COMPLICATIONS: None immediate. TECHNIQUE: Informed written consent was obtained from the patient after a thorough discussion of the procedural risks, benefits and alternatives. All questions were addressed.  Maximal Sterile Barrier Technique was utilized including caps, mask, sterile gowns, sterile gloves, sterile drape, hand hygiene and skin antiseptic. A timeout was performed prior to the initiation of the procedure. PROCEDURE: The right lower quadrant was prepped with ChloraPrep in a sterile fashion, and a sterile drape was applied covering the operative field. A sterile gown and sterile gloves were used for the procedure. Local anesthesia was provided with 1% Lidocaine. Under CT guidance, an 18 gauge needle was inserted into the right lower quadrant abscess. It was removed over an Amplatz wire. Twelve [place] dilator followed by a 12 [place] drain were inserted. The drain was looped and string fixed in the fluid collection. 30 cc frank pus was aspirated. It was sewn to the skin. FINDINGS: Imaging documents placement of a 57 French right lower quadrant abscess drain. IMPRESSION: Successful CT-guided right lower quadrant abscess drain. Electronically Signed   By: Marybelle Killings M.D.   On: 03/21/2016 17:09   Ir Radiologist Eval & Mgmt  Result Date: 04/05/2016 Please refer to "Notes" to see consult details.   Labs:  CBC:  Recent Labs  03/20/16 0534 03/21/16 0542 03/21/16 0700 03/26/16 0844  WBC 12.7* QUESTIONABLE RESULTS, RECOMMEND RECOLLECT TO VERIFY 12.0* 10.2  HGB 9.2* QUESTIONABLE RESULTS, RECOMMEND RECOLLECT TO VERIFY 9.2* 10.1*  HCT 29.1* QUESTIONABLE RESULTS, RECOMMEND RECOLLECT TO VERIFY 29.0* 32.2*  PLT 368 QUESTIONABLE  RESULTS, RECOMMEND RECOLLECT TO VERIFY 382 381    COAGS:  Recent Labs  03/21/16 0542 03/21/16 0700  INR NOT CALCULATED 1.33    BMP:  Recent Labs  03/20/16 0534 03/21/16 0700 03/22/16 0822 03/26/16 0844  NA 138 134* 135 139  K 3.7 3.7 4.2 3.9  CL 107 104 103 107  CO2 24 23 26 27   GLUCOSE 124* 125* 141* 134*  BUN 12 12 12 13   CALCIUM 8.4* 8.2* 8.6* 8.8*  CREATININE 0.71 0.77 0.56* 0.80  GFRNONAA >60 >60 >60 >60  GFRAA >60 >60 >60 >60    LIVER FUNCTION TESTS:  Recent Labs  03/18/16 0800 03/19/16 0530 03/22/16 0822 03/26/16 0844  BILITOT 0.6 0.4 0.4 0.2*  AST 11* 12* 16 18  ALT 9* 8* 11* 20  ALKPHOS 31* 30* 35* 46  PROT 6.6 7.0 7.8 7.5  ALBUMIN 2.7* 2.9* 3.0* 2.9*    Assessment and Plan:  Larry [name] is a 36 y.o. male with past medical history significant for Crohn's colitis completed by development of an abscess within the right iliac is musculature, post Perkins drainage catheter placement on 03/21/2016 by Dr. Barbie Banner.   Review of CT scan of the abdomen and pelvis performed earlier today demonstrates resolution of right iliacus intramuscular abscess. Subsequent drainage catheter injection was negative for fistualous connection.    Given these findings as well as lack of any substantial output from the percutaneous drainage catheter, the decision was made to remove the percutaneous drainage catheter.  This was done at the patient's bedside that incident. A dressing was placed.  The patient was instructed to complete his prescribed course of intravenous antibiotics and to keep all follow-up appointments with gastroenterology and Dr. Excell Seltzer.  A copy of this report was sent to the requesting provider on this date.  Electronically Signed: Sandi Mariscal 04/05/2016, 3:54 PM   I spent a total of 15 Minutes in face to face in clinical consultation, greater than 50% of which was counseling/coordinating care for Percutaneous drainage catheter management

## 2016-04-10 ENCOUNTER — Encounter: Payer: Self-pay | Admitting: Physician Assistant

## 2016-04-10 ENCOUNTER — Ambulatory Visit (INDEPENDENT_AMBULATORY_CARE_PROVIDER_SITE_OTHER): Payer: BLUE CROSS/BLUE SHIELD | Admitting: Physician Assistant

## 2016-04-10 ENCOUNTER — Other Ambulatory Visit (INDEPENDENT_AMBULATORY_CARE_PROVIDER_SITE_OTHER): Payer: BLUE CROSS/BLUE SHIELD

## 2016-04-10 VITALS — BP 90/60 | HR 112 | Ht 72.0 in | Wt 166.0 lb

## 2016-04-10 DIAGNOSIS — K50914 Crohn's disease, unspecified, with abscess: Secondary | ICD-10-CM

## 2016-04-10 LAB — COMPREHENSIVE METABOLIC PANEL
ALT: 19 U/L (ref 0–53)
AST: 13 U/L (ref 0–37)
Albumin: 3.8 g/dL (ref 3.5–5.2)
Alkaline Phosphatase: 52 U/L (ref 39–117)
BILIRUBIN TOTAL: 0.4 mg/dL (ref 0.2–1.2)
BUN: 12 mg/dL (ref 6–23)
CALCIUM: 9.5 mg/dL (ref 8.4–10.5)
CHLORIDE: 103 meq/L (ref 96–112)
CO2: 28 meq/L (ref 19–32)
Creatinine, Ser: 0.69 mg/dL (ref 0.40–1.50)
GFR: 169.12 mL/min (ref >60.00)
Glucose, Bld: 126 mg/dL — ABNORMAL HIGH (ref 70–99)
POTASSIUM: 4.2 meq/L (ref 3.5–5.1)
Sodium: 138 mEq/L (ref 135–145)
Total Protein: 8.4 g/dL — ABNORMAL HIGH (ref 6.0–8.3)

## 2016-04-10 LAB — CBC WITH DIFFERENTIAL/PLATELET
BASOS PCT: 0.7 % (ref 0.0–3.0)
Basophils Absolute: 0.1 10*3/uL (ref 0.0–0.1)
Eosinophils Absolute: 0.4 10*3/uL (ref 0.0–0.7)
Eosinophils Relative: 2.6 % (ref 0.0–5.0)
HCT: 35 % — ABNORMAL LOW (ref 39.0–52.0)
Hemoglobin: 11.2 g/dL — ABNORMAL LOW (ref 13.0–17.0)
LYMPHS ABS: 1.2 10*3/uL (ref 0.7–4.0)
LYMPHS PCT: 8.3 % — AB (ref 12.0–46.0)
MCHC: 32 g/dL (ref 30.0–36.0)
MCV: 80.2 fl (ref 78.0–100.0)
MONOS PCT: 9.9 % (ref 3.0–12.0)
Monocytes Absolute: 1.4 10*3/uL — ABNORMAL HIGH (ref 0.1–1.0)
NEUTROS ABS: 11 10*3/uL — AB (ref 1.4–7.7)
NEUTROS PCT: 78.5 % — AB (ref 43.0–77.0)
Platelets: 411 10*3/uL — ABNORMAL HIGH (ref 150.0–400.0)
RBC: 4.36 Mil/uL (ref 4.22–5.81)
RDW: 16 % — AB (ref 11.5–15.5)
WBC: 14 10*3/uL — ABNORMAL HIGH (ref 4.0–10.5)

## 2016-04-10 LAB — SEDIMENTATION RATE: Sed Rate: 58 mm/hr — ABNORMAL HIGH (ref 0–15)

## 2016-04-10 LAB — C-REACTIVE PROTEIN: CRP: 9.3 mg/dL (ref 0.5–20.0)

## 2016-04-10 NOTE — Progress Notes (Signed)
Chief Complaint: Follow up Crohns Disease with complications  HPI:  Larry [name] is a 36 year old African-American male with a past medical history of Crohn's disease with abscess, who now will follow with Dr. Havery Moros after recent hospitalization, who presents to clinic today for follow-up of his Crohn's disease with recent abscess.   Please recall patient was recently admitted to the hospital 03/16/16. He was originally diagnosed with Crohn's disease in 2015 and was using Imuran 159m qd and a combination of prednisone for flares over the past 3 years. [place] [place] but had not been seen since August of last year. At time of hospitalization he was on 5 mg prednisone daily. Initially presented with sharp right lower quadrant abdominal pain. Labs revealed an elevated white count at 12.3 and a CT scan revealed active Crohn's colitis with abscess collections in the right lower quadrant involving iliopsoas muscle. It was discussed at that time that long-term he would likely need a biologic, TB Gold and hepatitis panel were done. TB returned indeterminate and hepatitis panel was negative. It was also discussed that he was under-dosed on Imuran which should be 2-2.5 mg/kg thus 150-200 mg per day. This was held since time of his admission. Repeat imaging on 1/2 3/18 showed abscess had enlarged. IR was re-consulted for percutaneous drainage. IR placed the right lower quadrant drain on 03/21/16. 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 At that time it was discussed the next step may be ilial resection followed by outpatient GI management. Patient gradually improved and remained afebrile and leukocytosis resolved. The volume of his right lower quadrant drain progressively decreased. Repeat CT abdomen and pelvis showed improvement. Infectious diseases was consulted and patient was placed on 14 days of ceftriaxone 2 g IV daily and caspofungin 50 mg IV daily. PICC  line was placed. Patient was discharged on 03/27/16.   Repeat CT abdomen pelvis by IR on 04/05/16 showed resolved dominant component of right iliac uKoreaintramuscular abscess following percutaneous drainage catheter placement. Smaller serpiginous ill-defined fluid collection within the peripheral aspect of the right iliopsoas musculature had decreased in size in the interval, currently measuring 1.3 cm in diameter, previously 2.0 cm. Similar findings of terminal ileitis compatible with provided history of Crohn's disease. Previously question interloop fluid collections were less conspicuous on the present examination. Similar findings of mild patulous distention of the distal small bowel without evidence of enteric obstruction. Patient had his drain removed that day.   Today, the patient is a clinic and tells me that  overall he feels better but does continue with a right lower quadrant pain which is more "in my groin" on that side. Patient tells me he is not sure if this is because he "hasn't been moving around as much", or if it is still infected. Today is his last day of antibiotics that are prescribed, he is due to see infectious disease on Thursday of this week, 04/12/16 for removal of his PICC line. Patient tells me his bowel movements have been "more normal than ever". He denies running a fever or having chills, nausea or vomiting.   Patient's social history is positive for needing his FMLA paperwork adjusted as currently they state that he can go back to work tomorrow. This will not be the case as he needs further antibiotics and possible consultation by surgery due to continued abscess.   Patient denies weight loss, anorexia or change in abdominal pain.    Past Medical History:  Diagnosis Date  . Crohn's disease [facility])     Past Surgical History:  Procedure Laterality Date  . bowel abscess from crohns     . IR GENERIC HISTORICAL  04/05/2016   IR RADIOLOGIST EVAL & MGMT 04/05/2016 Sandi Mariscal, MD [facility]  INTERV RAD    Current Outpatient Prescriptions  Medication Sig Dispense Refill  . acetaminophen (TYLENOL) 325 MG tablet Take 2 tablets (650 mg total) by mouth every 6 (six) hours as needed for mild pain, moderate pain or fever.    . caspofungin 50 mg in sodium chloride 0.9 % 250 mL Inject 50 mg into the vein daily. Stop after 04/10/2016 dose. 1 Dose 13  . cefTRIAXone 2 g in dextrose 5 % 50 mL Inject 2 g into the vein daily. Stop after 04/10/16 dose. 1 Dose 13  . saccharomyces boulardii (FLORASTOR) 250 MG capsule Take 1 capsule (250 mg total) by mouth 2 (two) times daily. 60 capsule 0  . Sodium Chloride Flush (SALINE FLUSH) 0.9 % SOLN RLQ JP drain care: rec once daily flush with 5- 10 mls sterile NS, recording of output and dressing changes twice daily. 125 mL 0   No current facility-administered medications for this visit.     Allergies as of 04/10/2016  . (No Known Allergies)    History reviewed. No pertinent family history.  Social History   Social History  . Marital status: Single    Spouse name: N/A  . Number of children: N/A  . Years of education: N/A   Occupational History  . Not on file.   Social History Main Topics  . Smoking status: Never Smoker  . Smokeless tobacco: Never Used  . Alcohol use No  . Drug use: No  . Sexual activity: Not on file   Other Topics Concern  . Not on file   Social History Narrative  . No narrative on file    Review of Systems:    Constitutional: No weight loss, fever or chills Skin: No rash  Cardiovascular: No chest pain Respiratory: No SOB  Gastrointestinal: See HPI and otherwise negative Genitourinary: No dysuria or change in urinary frequency Neurological: No headache, dizziness or syncope Musculoskeletal: No new muscle or joint pain Hematologic: No bleeding or bruising Psychiatric: No history of depression or anxiety   Physical Exam:  Vital signs: BP 90/60   Pulse (!) 112   Ht 6' (1.829 m)   Wt 166 lb (75.3 kg)   BMI  22.51 kg/m   Constitutional:   Pleasant African American male appears to be in mild distress, Well developed, Well nourished, alert and cooperative Head:  Normocephalic and atraumatic. Eyes:   PEERL, EOMI. No icterus. Conjunctiva pink. Ears:  Normal auditory acuity. Neck:  Supple Throat: Oral cavity and pharynx without inflammation, swelling or lesion.  Respiratory: Respirations even and unlabored. Lungs clear to auscultation bilaterally.   No wheezes, crackles, or rhonchi.  Cardiovascular: Normal S1, S2. No MRG. Regular rate and rhythm. No peripheral edema, cyanosis or pallor.  Gastrointestinal:  Soft, nondistended, Marked RLQ ttp. No rebound or guarding. Normal bowel sounds. No appreciable masses or hepatomegaly. Rectal:  Not performed.  Msk:  Symmetrical without gross deformities. Without edema, no deformity or joint abnormality.  Neurologic:  Alert and  oriented x4;  grossly normal neurologically.  Skin:   Dry and intact without significant lesions or rashes. Psychiatric: Demonstrates good judgement and reason without abnormal affect or behaviors.  MOST RECENT LABS AND IMAGING: CBC    Component Value Date/Time  WBC 10.2 03/26/2016 0844   RBC 3.93 (L) 03/26/2016 0844   HGB 10.1 (L) 03/26/2016 0844   HCT 32.2 (L) 03/26/2016 0844   PLT 381 03/26/2016 0844   MCV 81.9 03/26/2016 0844   MCH 25.7 (L) 03/26/2016 0844   MCHC 31.4 03/26/2016 0844   RDW 14.3 03/26/2016 0844   LYMPHSABS 1.2 03/26/2016 0844   MONOABS 0.9 03/26/2016 0844   EOSABS 0.3 03/26/2016 0844   BASOSABS 0.0 03/26/2016 0844    CMP     Component Value Date/Time   NA 139 03/26/2016 0844   K 3.9 03/26/2016 0844   CL 107 03/26/2016 0844   CO2 27 03/26/2016 0844   GLUCOSE 134 (H) 03/26/2016 0844   BUN 13 03/26/2016 0844   CREATININE 0.80 03/26/2016 0844   CALCIUM 8.8 (L) 03/26/2016 0844   PROT 7.5 03/26/2016 0844   ALBUMIN 2.9 (L) 03/26/2016 0844   AST 18 03/26/2016 0844   ALT 20 03/26/2016 0844   ALKPHOS  46 03/26/2016 0844   BILITOT 0.2 (L) 03/26/2016 0844   GFRNONAA >60 03/26/2016 0844   GFRAA >60 03/26/2016 0844   Most recent CT: CT ABDOMEN AND PELVIS WITH CONTRAST 04/05/16  TECHNIQUE: Multidetector CT imaging of the abdomen and pelvis was performed using the standard protocol following bolus administration of intravenous contrast.  CONTRAST:  176m ISOVUE-300 IOPAMIDOL (ISOVUE-300) INJECTION 61%  COMPARISON:  CT abdomen pelvis - 03/26/2016; 03/20/2016; CT-guided percutaneous drainage catheter placement - 03/21/2016  FINDINGS: Lower chest: Limited visualization of lower thorax is negative for focal airspace opacity or pleural effusion.  Normal heart size.  No pericardial effusion.  Hepatobiliary: Normal hepatic contour. No discrete hepatic lesions. Normal appearance of the gallbladder. No radiopaque gallstones. No intra extrahepatic bili duct dilatation. No ascites.  Pancreas: Normal appearance of the pancreas.  Spleen: Normal appearance of the spleen.  Adrenals/Urinary Tract: There is symmetric enhancement of the bilateral kidneys. No renal stones. No urine obstruction or perinephric stranding.  Normal appearance of the bilateral adrenal glands.  Normal appearance of the urinary bladder given degree distention.  Stomach/Bowel: Unchanged positioning of percutaneous drainage catheter with end coiled and locked within the right iliacus musculature. Dominant iliacus musculature abscess has resolved.  Additional peripherally enhancing though ill-defined serpiginous fluid collection within the more caudal peripheral aspect of the right iliopsoas musculature has decreased in size the interval, currently measuring 1.3 x 0.8 cm (image 61, series 2), previously, 2.0 x 1.2 cm.  Re- demonstrated circumferential wall thickening involving the terminal ileum extending to involve the cecum (best seen on coronal images 38 through 42, series 601), compatible provided  history of Crohn's disease. These findings are associated with adjacent mesenteric stranding. Previously questioned tiny interloop fluid collections are not less well demonstrated on the present examination. No new definable/drainable intra- abdominal fluid collections.  Secondary inflammation again encompasses the retrocecal appendix however air is seen throughout the entirety of the appendix to the level of the appendiceal tip.  Large colonic stool burden. Mild patulous distension of several loops of distal small bowel with index loop of small bowel within the midline of the pelvis measuring approximately 4.9 cm, similar to the 03/26/2016 examination and again without definite evidence of enteric obstruction.  Vascular/Lymphatic: Normal caliber the abdominal aorta. The major branch vessels of the abdominal aorta appear patent on this non CTA examination.  Scattered retroperitoneal lymph nodes are numerous though individually not enlarged by size criteria. No bulky retroperitoneal, mesenteric, pelvic or inguinal lymphadenopathy.  Reproductive: Normal appearance of the  pelvic organs. There is a small amount of free fluid in the pelvic cul-de-sac.  Other: Regional soft tissues appear normal.  Musculoskeletal: No acute or aggressive osseous abnormalities. Moderate-sized posteriorly directed disc osteophyte complex at L5, S1, unchanged.  IMPRESSION: 1. Resolved dominant component of right iliacus intramuscular abscess following percutaneous drainage catheter placement. Smaller serpiginous ill-defined fluid collection within the peripheral aspect of the right iliopsoas musculature has decreased in size in the interval, currently measuring 1.3 cm in diameter, previously, 2.0 cm. 2. Similar findings of terminal ileitis compatible with provided history of Crohn's disease. Previously questioned interloop fluid collections are less conspicuous on the present examination. 3.  Similar findings of mild patulous distension of the distal small bowel without evidence of enteric obstruction.  PLAN: Patient will undergo percutaneous drainage catheter injection prior to consideration of percutaneous drainage catheter removal.   Electronically Signed   By: Sandi Mariscal M.D.   On: 04/05/2016 14:24  Assessment: 1. Crohn's disease complicated by intra-abdominal abscess: Patient was admitted recently from 03/16/16 through 03/27/16 and had drain placement for intra-abdominal abscess, repeat CT recently at time of drain removal showed continued 1 cm abscess, patient continues with right lower quadrant pain, not on maintenance medication at this time for Crohns, continues on antibiotics, last dose is scheduled for today per patient, he is due to see infectious disease on Thursday, 04/12/16: Concerned regarding ongoing pain and abscess, cannot start patient on Biologics during this acute process, though this is likely the medication that he needs, recent hepatitis panel negative, TB indeterminate, will need to discuss treatment plan further with infectious disease as well as surgical team  Plan: 1. Discussed case with Dr. Havery Moros at time of patient's appointment. He will be providing further recommendations after review of patient's case and discussion with various other specialties. 2. Ordered CBC, CMP, CRP and ESR today. 3. Place PPD today 4. Likely patient will need to continue on his IV antibiotics for now as CT shows continued abscess, will leave this to Dr. Havery Moros for recommendations 5. Did discuss all of the above with the patient. He will not be able to return to work as planned due to his ongoing abscess and need for possible surgical consultation, we did supply the patient with a work note until the 27th of this month, but his FMLA paperwork may need to be adjusted 6. Please await any further recommendations from Dr. Havery Moros, time of follow-up will be discussed  with patient after further review of his records.  45 minutes was spent in consultation and coordination of care for this patient today.  Ellouise Newer, PA-C [facility] 04/10/2016, 8:48 AM  Cc: No ref. provider found

## 2016-04-10 NOTE — Progress Notes (Signed)
Agree with assessment and plan. Perforating ileal Crohns disease complicated by abscesses. These were getting smaller in size but not resolved, drain was pulled. He's feeling worse now and on antibiotics. His Crohn's is clearly active and he is in need of biologic therapy however hesitant to start him on this without ensuring abscesses have resolved. WBC and inflammatory markers elevated. I think it may be likely that he needs surgery first, prior to initiation of biologic therapy. I'm going to discuss his case with surgery and ID - may need interval imaging to help sort this out.

## 2016-04-10 NOTE — Patient Instructions (Signed)
Your physician has requested that you go to the basement for lab work before leaving today.  We will call you with a follow up appointment with Dr. Havery Moros.   Please come back on Thursday to have your PPD test read.

## 2016-04-11 ENCOUNTER — Telehealth: Payer: Self-pay | Admitting: Gastroenterology

## 2016-04-11 NOTE — Telephone Encounter (Signed)
I discussed this patient's case with Dr. Donne Hazel of surgery. He's not feeling any better after a few weeks of antibiotics.  I'm concerned his abscesses, while improved on most recent imaging, have not resolved, making it hard to give him anti-TNF in the setting of rising WBC and worsening pain. I think operative management up front may be the better strategy at this time, with post-op anti TNF.   The surgery clinic was going to call him to schedule a follow up in the next day or so and discuss surgical options. I have called the patient's phone multiple times today but can't get ahold of him and to let him know. I spoke with the patient's mother who said the patient felt worse today and had a fever of 101. Given his worsening and fevers recommend he go to the hospital for admission and re-evaluation by surgery. Mother will attempt to call patient and I will as well.

## 2016-04-11 NOTE — Telephone Encounter (Signed)
The patient called back and I spoke with him. The patient's mother had reported he had a fever, but he denied this and states his abdomen feels stable, no worsening. He states overall he's stable and does not wish to go to the hospital. We discussed his labs and his course so far. I also have spoken with Dr. Drue Flirt of [facility] about his case who agreed that surgery up front would be the best strategy at this point, and to hold off on immunosuppresives at this time and continue antibiotics. Dr. Drue Flirt graciously agreed to see the patient within 48 hours and coordinate a surgery for him. I discussed this with the patient and his preference was to be seen at [facility] given Dr. Geronimo Boot expertise in Crohn's surgery. I provided them Dr. Geronimo Boot clinic information and he will call to schedule, they are expecting him to call tomorrow. I would recommend he continue antibiotics at this time and he will f/u with ID tomorrow. Hopefully surgery is done in the near future and we can start anti-TNF postoperatively to prevent recurrence.   They will call in the interim any questions / concerns, or if he feels worse

## 2016-04-12 ENCOUNTER — Telehealth: Payer: Self-pay | Admitting: Gastroenterology

## 2016-04-12 ENCOUNTER — Encounter: Payer: Self-pay | Admitting: Internal Medicine

## 2016-04-12 ENCOUNTER — Ambulatory Visit (INDEPENDENT_AMBULATORY_CARE_PROVIDER_SITE_OTHER): Payer: BLUE CROSS/BLUE SHIELD | Admitting: Internal Medicine

## 2016-04-12 ENCOUNTER — Telehealth: Payer: Self-pay | Admitting: *Deleted

## 2016-04-12 VITALS — BP 113/70 | HR 83 | Temp 98.1°F | Ht 72.0 in | Wt 165.0 lb

## 2016-04-12 DIAGNOSIS — K651 Peritoneal abscess: Secondary | ICD-10-CM | POA: Diagnosis not present

## 2016-04-12 DIAGNOSIS — R1031 Right lower quadrant pain: Secondary | ICD-10-CM

## 2016-04-12 LAB — TB SKIN TEST: TB Skin Test: NEGATIVE

## 2016-04-12 MED ORDER — OXYCODONE HCL 10 MG PO TABS: 10.0000 mg | ORAL_TABLET | Freq: Four times a day (QID) | ORAL | 0 refills | Status: DC

## 2016-04-12 NOTE — Patient Instructions (Signed)
Call us to let us know when your surgery is scheduled in order to decide if need to extend IV abtx further  Call clinic with information at [phone]

## 2016-04-12 NOTE — Progress Notes (Signed)
Patient ID: Larry [name], male   DOB: November 16, 1980, 36 y.o.   MRN: [id number]  HPI Mr blaney is a 36yo m with crohn's flare with multiple abdominal abscess s/p drain, culture withat is polymicrobial including yeast. He was treated with 2 wk course of ceftriaxone plus anidulafungin. Repeat Ct shows improvement, but still has remaining fluid collection concerning for residual abscess. He was seen by dr Havery Moros last week who also was concerned that he would need longer course of iv abtx given that he had leukocytosis and inflammatory marker still elevated with sed rate 58 down from greater than 130.   Still right lower quadrant pain, point tenderness. Most noticeable after resting but eases with movement Outpatient Encounter Prescriptions as of 04/12/2016  Medication Sig  . acetaminophen (TYLENOL) 325 MG tablet Take 2 tablets (650 mg total) by mouth every 6 (six) hours as needed for mild pain, moderate pain or fever.  . caspofungin 50 mg in sodium chloride 0.9 % 250 mL Inject 50 mg into the vein daily. Stop after 04/10/2016 dose.  . cefTRIAXone 2 g in dextrose 5 % 50 mL Inject 2 g into the vein daily. Stop after 04/10/16 dose.  . saccharomyces boulardii (FLORASTOR) 250 MG capsule Take 1 capsule (250 mg total) by mouth 2 (two) times daily.  . Sodium Chloride Flush (SALINE FLUSH) 0.9 % SOLN RLQ JP drain care: rec once daily flush with 5- 10 mls sterile NS, recording of output and dressing changes twice daily.   No facility-administered encounter medications on file as of 04/12/2016.      Patient Active Problem List   Diagnosis Date Noted  . Abdominal abscess ([place])   . Feeding difficulties   . Crohn's disease of ileum with abscess ([place])   . Abnormal CT of the abdomen   . Crohn's colitis, with abscess ([place]) 03/16/2016  . Crohn's colitis ([facility]) 03/16/2016  . Leukocytosis 03/16/2016  . Anemia 03/16/2016     Health Maintenance Due  Topic Date Due  . TETANUS/TDAP  08/03/2001  . INFLUENZA  VACCINE  09/27/2015     Review of Systems  Constitutional: Negative for fever, chills, diaphoresis, activity change, appetite change, fatigue and unexpected weight change.  HENT: Negative for congestion, sore throat, rhinorrhea, sneezing, trouble swallowing and sinus pressure.  Eyes: Negative for photophobia and visual disturbance.  Respiratory: Negative for cough, chest tightness, shortness of breath, wheezing and stridor.  Cardiovascular: Negative for chest pain, palpitations and leg swelling.  Gastrointestinal: + right lower quadrant pain.Negative for nausea, vomiting, abdominal pain, diarrhea, constipation, blood in stool, abdominal distention and anal bleeding.  Genitourinary: Negative for dysuria, hematuria, flank pain and difficulty urinating.  Musculoskeletal: Negative for myalgias, back pain, joint swelling, arthralgias and gait problem.  Skin: Negative for color change, pallor, rash and wound.  Neurological: Negative for dizziness, tremors, weakness and light-headedness.  Hematological: Negative for adenopathy. Does not bruise/bleed easily.  Psychiatric/Behavioral: Negative for behavioral problems, confusion, sleep disturbance, dysphoric mood, decreased concentration and agitation.    Physical Exam   BP 113/70   Pulse 83   Temp 98.1 F (36.7 C) (Oral)   Ht 6' (1.829 m)   Wt 165 lb (74.8 kg)   BMI 22.38 kg/m    Mild rebound tenderness  Wbc up to 14K on 2/13 CBC Lab Results  Component Value Date   WBC 14.0 (H) 04/10/2016   RBC 4.36 04/10/2016   HGB 11.2 (L) 04/10/2016   HCT 35.0 (L) 04/10/2016   PLT 411.0 (H) 04/10/2016  MCV 80.2 04/10/2016   MCH 25.7 (L) 03/26/2016   MCHC 32.0 04/10/2016   RDW 16.0 (H) 04/10/2016   LYMPHSABS 1.2 04/10/2016   MONOABS 1.4 (H) 04/10/2016   EOSABS 0.4 04/10/2016    BMET Lab Results  Component Value Date   NA 138 04/10/2016   K 4.2 04/10/2016   CL 103 04/10/2016   CO2 28 04/10/2016   GLUCOSE 126 (H) 04/10/2016   BUN 12  04/10/2016   CREATININE 0.69 04/10/2016   CALCIUM 9.5 04/10/2016   GFRNONAA >60 03/26/2016   GFRAA >60 03/26/2016      Assessment and Plan Intra-abdominal abscess =  Plan to continue iv abtx x 2 more weeks,wit ertapenem and caspofungin, anticipate he is getting surgery evaluation tomorrow at [facility] for primary resection. Will anticipate to extend abtx until surgery.  Pain = will give rx for oxycodone 50m x 30 pills for discomfort

## 2016-04-12 NOTE — Telephone Encounter (Signed)
Order to continue IV ceftriaxone and capofungin for two weeks faxed to [facility] at [phone], confirmation received. Phone [phone]. No order to pull picc.

## 2016-04-12 NOTE — Telephone Encounter (Signed)
Called Dr. Geronimo Boot office, they do not have anything re: referral. Faxed records to [phone] Attn: Carmell Austria. Called patient back to let him know to expect a call from their office today. Best contact # for patient is: [phone], will update our records.

## 2016-04-13 ENCOUNTER — Telehealth: Payer: Self-pay | Admitting: Pharmacist Clinician (PhC)/ Clinical Pharmacy Specialist

## 2016-04-13 NOTE — Telephone Encounter (Signed)
Amy called to verify if his Roc/Caspo will be extended for 2 more weeks. Told her yes.

## 2016-04-18 ENCOUNTER — Telehealth: Payer: Self-pay | Admitting: Gastroenterology

## 2016-04-18 NOTE — Telephone Encounter (Signed)
Yes if you could write him a note for that time frame that would be appreciated.   I referred him to Dr. Drue Flirt and if you can let him know I am waiting to hear back from her to discuss their visit regarding any decision for possible surgery and the timing of that. He should continue his antibiotics and will touch base with him when I hear back from her about the plan. Thanks

## 2016-04-18 NOTE — Progress Notes (Signed)
Letter mailed

## 2016-04-18 NOTE — Telephone Encounter (Signed)
Faxed prior auth info to [facility] and [employer].

## 2016-04-18 NOTE — Telephone Encounter (Signed)
Called patient and spoke to Dr. Drue Flirt, colorectal surgery at [facility], about their recent visit. Surgery has been recommended but the patient has a 14 week old infant and he wants to be home with his family, and declined surgery at this time. Since the last time I have spoken with him he continues to feel better on antibiotics. In discussion with Dr. Drue Flirt, she is comfortable with the patient starting anti-TNF at this time and holding off on surgery per patient preference. The risk of this strategy is worsening of residual abscess, and if he feels worse on anti-TNF, becomes febrile with concern for worsening abscesses, she would take him to the OR for surgery emergently. He has had his course of antibiotics extended another 2 weeks per Dr. Baxter Flattery. Hopefully the abscess is much smaller / resolved at this time and does well.   I have discussed risks / benefits of anti-TNF with him at length to include increased risk of infection, injection site reactions, other autoimmune phenomenon, lymphoma, etc. Following this discussion he wished to proceed.   Will plan on checking CBC and inflammatory markers about a week after starting. If he feels worse, develops fevers, etc, in the interim he needs to contact myself and Dr. Drue Flirt. I will contact Dr. Baxter Flattery to let her know as well. He will complete another few weeks of antibiotics. Patient has had negative PPD testing and hepatitis, cleared to start.  Almyra Free can you please order and help coordinate Humira loading for this patient and let me know when he starts? He needs to be monitored closely after we start therapy. Thanks

## 2016-04-18 NOTE — Telephone Encounter (Signed)
Patient asked that letter be mailed.

## 2016-04-18 NOTE — Telephone Encounter (Signed)
Spoke to patient, he said that you had ordered another 2 weeks of IV medication and cannot go back to work on 2/27 as originally planned. He needs a note to say he cannot go back until his next shift which would be 05/02/16. I will be glad to write a note, but I do not see where you ordered this. Thanks.

## 2016-04-22 ENCOUNTER — Emergency Department (HOSPITAL_COMMUNITY): Payer: BLUE CROSS/BLUE SHIELD

## 2016-04-22 ENCOUNTER — Inpatient Hospital Stay (HOSPITAL_COMMUNITY)
Admission: EM | Admit: 2016-04-22 | Discharge: 2016-04-25 | DRG: 385 | Disposition: A | Discharge status: Other Acute Inpatient Hospital | Payer: BLUE CROSS/BLUE SHIELD | Attending: Family Medicine | Admitting: Family Medicine

## 2016-04-22 ENCOUNTER — Encounter (HOSPITAL_COMMUNITY): Payer: Self-pay | Admitting: Emergency Medicine

## 2016-04-22 DIAGNOSIS — K6812 Psoas muscle abscess: Secondary | ICD-10-CM | POA: Diagnosis present

## 2016-04-22 DIAGNOSIS — L0291 Cutaneous abscess, unspecified: Secondary | ICD-10-CM

## 2016-04-22 DIAGNOSIS — K50113 Crohn's disease of large intestine with fistula: Secondary | ICD-10-CM | POA: Diagnosis not present

## 2016-04-22 DIAGNOSIS — K50014 Crohn's disease of small intestine with abscess: Secondary | ICD-10-CM | POA: Diagnosis not present

## 2016-04-22 DIAGNOSIS — K50814 Crohn's disease of both small and large intestine with abscess: Secondary | ICD-10-CM | POA: Diagnosis present

## 2016-04-22 DIAGNOSIS — D509 Iron deficiency anemia, unspecified: Secondary | ICD-10-CM | POA: Diagnosis present

## 2016-04-22 DIAGNOSIS — D75839 Thrombocytosis, unspecified: Secondary | ICD-10-CM | POA: Diagnosis present

## 2016-04-22 DIAGNOSIS — D473 Essential (hemorrhagic) thrombocythemia: Secondary | ICD-10-CM | POA: Diagnosis not present

## 2016-04-22 DIAGNOSIS — K50813 Crohn's disease of both small and large intestine with fistula: Secondary | ICD-10-CM | POA: Diagnosis present

## 2016-04-22 DIAGNOSIS — K50114 Crohn's disease of large intestine with abscess: Secondary | ICD-10-CM | POA: Diagnosis not present

## 2016-04-22 DIAGNOSIS — K651 Peritoneal abscess: Secondary | ICD-10-CM

## 2016-04-22 DIAGNOSIS — K50119 Crohn's disease of large intestine with unspecified complications: Secondary | ICD-10-CM | POA: Insufficient documentation

## 2016-04-22 LAB — COMPREHENSIVE METABOLIC PANEL
ALT: 10 U/L — AB (ref 17–63)
AST: 14 U/L — AB (ref 15–41)
Albumin: 3.3 g/dL — ABNORMAL LOW (ref 3.5–5.0)
Alkaline Phosphatase: 46 U/L (ref 38–126)
Anion gap: 9 (ref 5–15)
BUN: 13 mg/dL (ref 6–20)
CHLORIDE: 106 mmol/L (ref 101–111)
CO2: 22 mmol/L (ref 22–32)
Calcium: 8.9 mg/dL (ref 8.9–10.3)
Creatinine, Ser: 0.82 mg/dL (ref 0.61–1.24)
GFR calc Af Amer: >60 mL/min (ref >60)
Glucose, Bld: 96 mg/dL (ref 65–99)
POTASSIUM: 3.5 mmol/L (ref 3.5–5.1)
SODIUM: 137 mmol/L (ref 135–145)
Total Bilirubin: 0.5 mg/dL (ref 0.3–1.2)
Total Protein: 8.3 g/dL — ABNORMAL HIGH (ref 6.5–8.1)

## 2016-04-22 LAB — CBC WITH DIFFERENTIAL/PLATELET
BASOS ABS: 0.1 10*3/uL (ref 0.0–0.1)
BASOS PCT: 0 %
EOS PCT: 0 %
Eosinophils Absolute: 0 10*3/uL (ref 0.0–0.7)
HCT: 31.1 % — ABNORMAL LOW (ref 39.0–52.0)
Hemoglobin: 10.2 g/dL — ABNORMAL LOW (ref 13.0–17.0)
LYMPHS PCT: 9 %
Lymphs Abs: 1.2 10*3/uL (ref 0.7–4.0)
MCH: 25.2 pg — ABNORMAL LOW (ref 26.0–34.0)
MCHC: 32.8 g/dL (ref 30.0–36.0)
MCV: 76.8 fL — ABNORMAL LOW (ref 78.0–100.0)
MONO ABS: 1.2 10*3/uL — AB (ref 0.1–1.0)
Monocytes Relative: 8 %
NEUTROS ABS: 11.8 10*3/uL — AB (ref 1.7–7.7)
Neutrophils Relative %: 83 %
PLATELETS: 463 10*3/uL — AB (ref 150–400)
RBC: 4.05 MIL/uL — AB (ref 4.22–5.81)
RDW: 13.9 % (ref 11.5–15.5)
WBC: 14.3 10*3/uL — AB (ref 4.0–10.5)

## 2016-04-22 LAB — SEDIMENTATION RATE: SED RATE: 101 mm/h — AB (ref 0–16)

## 2016-04-22 LAB — C-REACTIVE PROTEIN: CRP: 10.7 mg/dL — AB (ref <1.0)

## 2016-04-22 LAB — I-STAT CG4 LACTIC ACID, ED: Lactic Acid, Venous: 1.26 mmol/L (ref 0.5–1.9)

## 2016-04-22 MED ORDER — SODIUM CHLORIDE 0.9 % IV SOLN
INTRAVENOUS | Status: DC
  Administered 2016-04-23 (×2): via INTRAVENOUS

## 2016-04-22 MED ORDER — ONDANSETRON HCL 4 MG PO TABS: 4.0000 mg | ORAL_TABLET | Freq: Four times a day (QID) | ORAL | Status: DC | PRN

## 2016-04-22 MED ORDER — HYDROMORPHONE HCL 2 MG/ML IJ SOLN
0.5000 mg | INTRAMUSCULAR | Status: DC | PRN
  Administered 2016-04-23 – 2016-04-24 (×11): 1 mg via INTRAVENOUS
  Filled 2016-04-22 (×11): qty 1

## 2016-04-22 MED ORDER — HYDROMORPHONE HCL 1 MG/ML IJ SOLN
0.5000 mg | Freq: Once | INTRAMUSCULAR | Status: AC
  Administered 2016-04-22: 0.5 mg via INTRAVENOUS
  Filled 2016-04-22: qty 1

## 2016-04-22 MED ORDER — IOPAMIDOL (ISOVUE-300) INJECTION 61%
INTRAVENOUS | Status: AC
  Administered 2016-04-22: 100 mL via INTRAVENOUS
  Filled 2016-04-22: qty 30

## 2016-04-22 MED ORDER — ONDANSETRON HCL 4 MG/2ML IJ SOLN
4.0000 mg | Freq: Once | INTRAMUSCULAR | Status: AC
  Administered 2016-04-22: 4 mg via INTRAVENOUS
  Filled 2016-04-22: qty 2

## 2016-04-22 MED ORDER — HYDROMORPHONE HCL 1 MG/ML IJ SOLN
0.5000 mg | INTRAMUSCULAR | Status: DC
  Administered 2016-04-22 (×3): 0.5 mg via INTRAVENOUS
  Filled 2016-04-22 (×3): qty 1

## 2016-04-22 MED ORDER — ENOXAPARIN SODIUM 40 MG/0.4ML ~~LOC~~ SOLN
40.0000 mg | SUBCUTANEOUS | Status: DC
  Filled 2016-04-22 (×2): qty 0.4

## 2016-04-22 MED ORDER — ONDANSETRON HCL 4 MG/2ML IJ SOLN: 4.0000 mg | Freq: Four times a day (QID) | INTRAMUSCULAR | Status: DC | PRN

## 2016-04-22 MED ORDER — CASPOFUNGIN ACETATE 50 MG IV SOLR
50.0000 mg | Freq: Every day | INTRAVENOUS | Status: DC
  Filled 2016-04-22: qty 50

## 2016-04-22 MED ORDER — ACETAMINOPHEN 650 MG RE SUPP: 650.0000 mg | Freq: Four times a day (QID) | RECTAL | Status: DC | PRN

## 2016-04-22 MED ORDER — ACETAMINOPHEN 325 MG PO TABS: 650.0000 mg | ORAL_TABLET | Freq: Four times a day (QID) | ORAL | Status: DC | PRN

## 2016-04-22 MED ORDER — PIPERACILLIN-TAZOBACTAM 3.375 G IVPB
3.3750 g | Freq: Three times a day (TID) | INTRAVENOUS | Status: DC
  Administered 2016-04-23 – 2016-04-25 (×7): 3.375 g via INTRAVENOUS
  Filled 2016-04-22 (×9): qty 50

## 2016-04-22 NOTE — ED Provider Notes (Signed)
[facility] DEPT Provider Note   CSN: [id number] Arrival date & time: 04/22/16  1653     History   Chief Complaint Chief Complaint  Patient presents with  . Abdominal Pain    HPI Saksham Akkerman is a 36 y.o. male.  HPI Hanley Woerner is a 36 y.o. male with history of Crohn's disease with recurrent intra-abdominal abscesses, currently on Rocephin through [facility] line, presents to emergency department with swelling and pain to the right inguinal area. Patient reports pain to the groin area over the last week, states noticed swollen area to the right groin 2 days ago. States it is getting larger. He reports normal bowel movements, last bowel movement yesterday. Denies nausea or vomiting. Denies any other abdominal pain. Reports fever up to 100.3   Past Medical History:  Diagnosis Date  . Crohn's disease [facility])     Patient Active Problem List   Diagnosis Date Noted  . Abdominal abscess ([place])   . Feeding difficulties   . Crohn's disease of ileum with abscess ([place])   . Abnormal CT of the abdomen   . Crohn's colitis, with abscess ([place]) 03/16/2016  . Crohn's colitis ([place]) 03/16/2016  . Leukocytosis 03/16/2016  . Anemia 03/16/2016    Past Surgical History:  Procedure Laterality Date  . bowel abscess from crohns     . IR GENERIC HISTORICAL  04/05/2016   IR RADIOLOGIST EVAL & MGMT 04/05/2016 Sandi Mariscal, MD [facility] INTERV RAD       Home Medications    Prior to Admission medications   Medication Sig Start Date End Date Taking? Authorizing Provider  acetaminophen (TYLENOL) 500 MG tablet Take 1,000 mg by mouth every 6 (six) hours as needed.   Yes Historical Provider, MD  caspofungin (CANCIDAS) 50 MG injection Inject 50 mg into the vein daily. 03/28/16  Yes Historical Provider, MD  cefTRIAXone (ROCEPHIN) 2-2.22 GM-% IVPB Inject 2 g into the vein daily. 03/28/16  Yes Historical Provider, MD  Oxycodone HCl 10 MG TABS Take 1 tablet (10 mg total) by mouth every 6 (six) hours. 04/12/16  Yes  Carlyle Basques, MD  acetaminophen (TYLENOL) 325 MG tablet Take 2 tablets (650 mg total) by mouth every 6 (six) hours as needed for mild pain, moderate pain or fever. Patient not taking: Reported on 04/22/2016 03/27/16   Modena Jansky, MD  caspofungin 50 mg in sodium chloride 0.9 % 250 mL Inject 50 mg into the vein daily. Stop after 04/10/2016 dose. Patient not taking: Reported on 04/22/2016 03/28/16   Modena Jansky, MD  cefTRIAXone 2 g in dextrose 5 % 50 mL Inject 2 g into the vein daily. Stop after 04/10/16 dose. Patient not taking: Reported on 04/22/2016 03/28/16   Modena Jansky, MD  Sodium Chloride Flush (SALINE FLUSH) 0.9 % SOLN RLQ JP drain care: rec once daily flush with 5- 10 mls sterile NS, recording of output and dressing changes twice daily. Patient not taking: Reported on 04/22/2016 03/27/16   Modena Jansky, MD    Family History No family history on file.  Social History Social History  Substance Use Topics  . Smoking status: Never Smoker  . Smokeless tobacco: Never Used  . Alcohol use No     Allergies   Patient has no known allergies.   Review of Systems Review of Systems  Constitutional: Positive for fever. Negative for chills.  Respiratory: Negative for cough, chest tightness and shortness of breath.   Cardiovascular: Negative for chest pain, palpitations and leg swelling.  Gastrointestinal: Positive for abdominal pain. Negative for abdominal distention, diarrhea, nausea and vomiting.  Genitourinary: Negative for dysuria, frequency, hematuria and urgency.  Musculoskeletal: Negative for arthralgias, myalgias, neck pain and neck stiffness.  Skin: Negative for rash.  Allergic/Immunologic: Negative for immunocompromised state.  Neurological: Negative for dizziness, weakness, light-headedness, numbness and headaches.  All other systems reviewed and are negative.    Physical Exam Updated Vital Signs BP 116/80 (BP Location: Left Arm)   Pulse 117   Temp 97.9 F  (36.6 C) (Oral)   Resp 18   Ht 6' (1.829 m)   Wt 73.9 kg   SpO2 100%   BMI 22.11 kg/m   Physical Exam  Constitutional: He appears well-developed and well-nourished. No distress.  HENT:  Head: Normocephalic and atraumatic.  Eyes: Conjunctivae are normal.  Neck: Neck supple.  Cardiovascular: Normal rate, regular rhythm and normal heart sounds.   Pulmonary/Chest: Effort normal. No respiratory distress. He has no wheezes. He has no rales.  Abdominal: Soft. Bowel sounds are normal. He exhibits no distension. There is tenderness. There is no rebound.  RLQ tenderness. There is approximately 4x2 cm enlarged, round swelling to the right inguinal region. TTP. Skin slightly erythematous.   Musculoskeletal: He exhibits no edema.  Neurological: He is alert.  Skin: Skin is warm and dry.  Nursing note and vitals reviewed.    ED Treatments / Results  Labs (all labs ordered are listed, but only abnormal results are displayed) Labs Reviewed  CBC WITH DIFFERENTIAL/PLATELET - Abnormal; Notable for the following:       Result Value   WBC 14.3 (*)    RBC 4.05 (*)    Hemoglobin 10.2 (*)    HCT 31.1 (*)    MCV 76.8 (*)    MCH 25.2 (*)    Platelets 463 (*)    Neutro Abs 11.8 (*)    Monocytes Absolute 1.2 (*)    All other components within normal limits  SEDIMENTATION RATE - Abnormal; Notable for the following:    Sed Rate 101 (*)    All other components within normal limits  C-REACTIVE PROTEIN - Abnormal; Notable for the following:    CRP 10.7 (*)    All other components within normal limits  COMPREHENSIVE METABOLIC PANEL - Abnormal; Notable for the following:    Total Protein 8.3 (*)    Albumin 3.3 (*)    AST 14 (*)    ALT 10 (*)    All other components within normal limits  CULTURE, BLOOD (ROUTINE X 2)  CULTURE, BLOOD (ROUTINE X 2)  CBC  COMPREHENSIVE METABOLIC PANEL  I-STAT CG4 LACTIC ACID, ED    EKG  EKG Interpretation None       Radiology Ct Abdomen Pelvis W  Contrast  Result Date: 04/22/2016 CLINICAL DATA:  Crohn's disease. EXAM: CT ABDOMEN AND PELVIS WITH CONTRAST TECHNIQUE: Multidetector CT imaging of the abdomen and pelvis was performed using the standard protocol following bolus administration of intravenous contrast. CONTRAST:  1 ISOVUE-300 IOPAMIDOL (ISOVUE-300) INJECTION 61% COMPARISON:  04/05/2016 FINDINGS: Lower chest: The lung bases are clear.  No pleural effusion. Hepatobiliary: No focal liver abnormality is seen. No gallstones, gallbladder wall thickening, or biliary dilatation. Pancreas: Unremarkable. No pancreatic ductal dilatation or surrounding inflammatory changes. Spleen: Normal in size without focal abnormality. Adrenals/Urinary Tract: Adrenal glands are unremarkable. Kidneys are normal, without renal calculi, focal lesion, or hydronephrosis. Bladder is unremarkable. Stomach/Bowel: The stomach appears normal. There is persistent and progressive inflammatory changes involving the right lower  quadrant bowel loops including the terminal ileum. Evidence of persistent and recurrent penetrating disease with fistula formation is identified 2 previously drained abscess cavity, image 64 series 2. There is a recurrent abscess within the right lower quadrant which extends into the ileo psoas muscle. 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 Right lower quadrant abscess measures 7.2 by 3.0 by 3.9 cm, image 65 of series 2 and image 57 of series 3. Enterocutaneous fistula/ sinus tract extending to the anterior aspect of the upper thigh it measures approximately 11.3 cm, image 45 of series 3. No evidence for bowel obstruction. Vascular/Lymphatic: No significant vascular findings are present. No enlarged abdominal or pelvic lymph nodes. Reproductive: Uterus and bilateral adnexa are unremarkable. Other: No abdominal wall hernia or abnormality. No  abdominopelvic ascites. Musculoskeletal: No acute or significant osseous findings. IMPRESSION: 1. Examination is positive for persistent progressive inflammatory changes of Crohn's enteritis involving the right lower quadrant bowel loops. 2. 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 3. No evidence for bowel obstruction. Electronically Signed   By: Kerby Moors M.D.   On: 04/22/2016 21:49    Procedures Procedures (including critical care time)  Medications Ordered in ED Medications  HYDROmorphone (DILAUDID) injection 0.5 mg (0.5 mg Intravenous Given 04/22/16 1813)  ondansetron (ZOFRAN) injection 4 mg (4 mg Intravenous Given 04/22/16 1813)     Initial Impression / Assessment and Plan / ED Course  I have reviewed the triage vital signs and the nursing notes.  Pertinent labs & imaging results that were available during my care of the patient were reviewed by me and considered in my medical decision making (see chart for details).     Patient with history of Crohn's disease, here with increased swelling and pain to the right inguinal area. He has had multiple intra-abdominal abscesses recently for which he is currently taking IV Rocephin daily. I will check labs on this patient. We attempted to use bedside ultrasound to evaluate the swelling, differential includes lymphadenopathy, hernia, abscess. Will get a CT scan.   Patient's white blood cell count is 40.3, elevated sedimentation rate of 101. 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 This would explain the swelling to his right inguinal area. I discussed patient with  general surgery, who explained that at this time their services are not needed. Recommended gastroenterology and medicine consults. I spoke with the triad who will admit patient. I paged [facility] gastroenterology several times, with no return phone call. I did inform Dr. Candace Cruise. Who admitted patient that I was unable to get in touch with gastroenterology.   Vitals:   04/22/16 1657 04/22/16 1912 04/22/16 2323  BP: 116/80 97/68 100/61  Pulse: 117 84 92  Resp: 18 18 18   Temp: 97.9 F (36.6 C)  98.3 F (36.8 C)  TempSrc: Oral  Oral  SpO2: 100% 100% 95%  Weight: 73.9 kg    Height: 6' (1.829 m)        Final Clinical Impressions(s) / ED Diagnoses   Final diagnoses:  Intra-abdominal abscess ([facility])  Crohn's colitis, with abscess ([place])  New Prescriptions Current Discharge Medication List       Jeannett Senior, PA-C 04/23/16 [id number]    Gareth Morgan, MD 04/24/16 [phone]

## 2016-04-22 NOTE — H&P (Signed)
History and Physical    Larry [name] BDZ:[id number] DOB: 1980/06/04 DOA: 04/22/2016  PCP: Pcp Not In System   Patient coming from: Home  Chief Complaint: Right groin pain and swelling, fever/chills   HPI: Larry [name] is a 36 y.o. male with medical history significant forCrohn's disease and chronic microcytic anemia, presenting to the emergency department for evaluation of right lower quadrant and right groin pain with fevers and chills. 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 He had a drain placed by IR, was treated with antibiotics, and was discharged with PICC to continue antibiotics. GI consultants were hesitant to start biologic therapy prior to resolution of the abscesses and advised that he may need a resection prior to starting treatment. Drain was pulled on 04/05/2016 after repeat imaging demonstrated improvement in the abscesses. He had persistent elevation in WBC and inflammatory markers and his antibiotic course was extended by ID. He was seen by surgery at [facility], but declined resection at this time, hoping to improve with medical therapy and get back to work. Three days ago, the patient noted a tender "knot" in the right groin. He had been experiencing pain in the right pelvis with ambulation for the past month, but this worsened significantly over the past several days. He reports fevers and chills at home last night. He denies vomiting or diarrhea and denies chest pain or palpitations. There is been no cough or dyspnea. Patient had been taking caspofungin and Rocephin through his PICC in the right upper extremity.  ED Course: Upon arrival to the ED, patient is found to be afebrile, saturating well on room air, tachycardic in the 110s, and with vitals otherwise stable. Chemstrip panel is largely unremarkable and CBC is notable for a leukocytosis to 14,300,  slightly increased from 2 weeks ago, microcytic anemia with hemoglobin 10.2, down from 11.2 two weeks prior, and a thrombocytosis which has progressed in the past 2 weeks to 463,000. Lactic acid is reassuring at 1.26. 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 Patient was treated with IV analgesics in the ED and GI and general surgery were consulted by the ED provider. A medical admission was advised. Patient's tachycardia resolved with analgesics and he has remained hemodynamically stable and in no apparent respiratory distress. He will be admitted to the medical-surgical unit for ongoing evaluation and management of Crohn's disease with abscess and fistula.  Review of Systems:  All other systems reviewed and apart from HPI, are negative.  Past Medical History:  Diagnosis Date  . Crohn's disease [facility])     Past Surgical History:  Procedure Laterality Date  . bowel abscess from crohns     . IR GENERIC HISTORICAL  04/05/2016   IR RADIOLOGIST EVAL & MGMT 04/05/2016 Sandi Mariscal, MD [facility] INTERV RAD     reports that he has never smoked. He has never used smokeless tobacco. He reports that he does not drink alcohol or use drugs.  No Known Allergies  History reviewed. No pertinent family history.   Prior to Admission medications   Medication Sig Start Date End Date Taking? Authorizing Provider  acetaminophen (TYLENOL) 500 MG tablet Take 1,000 mg by mouth every 6 (six) hours as  needed.   Yes Historical Provider, MD  caspofungin (CANCIDAS) 50 MG injection Inject 50 mg into the vein daily. 03/28/16  Yes Historical Provider, MD  cefTRIAXone (ROCEPHIN) 2-2.22 GM-% IVPB Inject 2 g into the vein daily. 03/28/16  Yes Historical  Provider, MD  Oxycodone HCl 10 MG TABS Take 1 tablet (10 mg total) by mouth every 6 (six) hours. 04/12/16  Yes Carlyle Basques, MD  acetaminophen (TYLENOL) 325 MG tablet Take 2 tablets (650 mg total) by mouth every 6 (six) hours as needed for mild pain, moderate pain or fever. Patient not taking: Reported on 04/22/2016 03/27/16   Modena Jansky, MD  caspofungin 50 mg in sodium chloride 0.9 % 250 mL Inject 50 mg into the vein daily. Stop after 04/10/2016 dose. Patient not taking: Reported on 04/22/2016 03/28/16   Modena Jansky, MD  cefTRIAXone 2 g in dextrose 5 % 50 mL Inject 2 g into the vein daily. Stop after 04/10/16 dose. Patient not taking: Reported on 04/22/2016 03/28/16   Modena Jansky, MD  Sodium Chloride Flush (SALINE FLUSH) 0.9 % SOLN RLQ JP drain care: rec once daily flush with 5- 10 mls sterile NS, recording of output and dressing changes twice daily. Patient not taking: Reported on 04/22/2016 03/27/16   Modena Jansky, MD    Physical Exam: Vitals:   04/22/16 1657 04/22/16 1912 04/22/16 2323  BP: 116/80 97/68 100/61  Pulse: 117 84 92  Resp: 18 18 18   Temp: 97.9 F (36.6 C)  98.3 F (36.8 C)  TempSrc: Oral  Oral  SpO2: 100% 100% 95%  Weight: 73.9 kg (163 lb)    Height: 6' (1.829 m)        Constitutional: NAD, calm, in obvious discomfort Eyes: PERTLA, lids and conjunctivae normal ENMT: Mucous membranes are moist. Posterior pharynx clear of any exudate or lesions.   Neck: normal, supple, no masses, no thyromegaly Respiratory: clear to auscultation bilaterally, no wheezing, no crackles. Normal respiratory effort.    Cardiovascular: S1 & S2 heard, regular rate and rhythm, hyperdynamic precordium. No extremity edema. No significant JVD. Abdomen: No distension, tender in right groin around a firm tender mass, no masses palpated. Bowel sounds normal.  Musculoskeletal: no clubbing / cyanosis. No joint deformity upper and lower extremities. Normal muscle tone.  Skin: no  significant rashes, lesions, ulcers. Warm, dry, well-perfused. Neurologic: CN 2-12 grossly intact. Sensation intact, DTR normal. Strength 5/5 in all 4 limbs.  Psychiatric: Normal judgment and insight. Alert and oriented x 3. Normal mood and affect.     Labs on Admission: I have personally reviewed following labs and imaging studies  CBC:  Recent Labs Lab 04/22/16 1808  WBC 14.3*  NEUTROABS 11.8*  HGB 10.2*  HCT 31.1*  MCV 76.8*  PLT 941*   Basic Metabolic Panel:  Recent Labs Lab 04/22/16 1808  NA 137  K 3.5  CL 106  CO2 22  GLUCOSE 96  BUN 13  CREATININE 0.82  CALCIUM 8.9   GFR: Estimated Creatinine Clearance: 133.9 mL/min (by C-G formula based on SCr of 0.82 mg/dL). Liver Function Tests:  Recent Labs Lab 04/22/16 1808  AST 14*  ALT 10*  ALKPHOS 46  BILITOT 0.5  PROT 8.3*  ALBUMIN 3.3*   No results for input(s): LIPASE, AMYLASE in the last 168 hours. No results for input(s): AMMONIA in the last 168 hours. Coagulation Profile: No results for input(s): INR, PROTIME in the last 168 hours. Cardiac Enzymes: No results for input(s): CKTOTAL, CKMB, CKMBINDEX,  TROPONINI in the last 168 hours. BNP (last 3 results) No results for input(s): PROBNP in the last 8760 hours. HbA1C: No results for input(s): HGBA1C in the last 72 hours. CBG: No results for input(s): GLUCAP in the last 168 hours. Lipid Profile: No results for input(s): CHOL, HDL, LDLCALC, TRIG, CHOLHDL, LDLDIRECT in the last 72 hours. Thyroid Function Tests: No results for input(s): TSH, T4TOTAL, FREET4, T3FREE, THYROIDAB in the last 72 hours. Anemia Panel: No results for input(s): VITAMINB12, FOLATE, FERRITIN, TIBC, IRON, RETICCTPCT in the last 72 hours. Urine analysis:    Component Value Date/Time   COLORURINE AMBER (A) 03/16/2016 0219   APPEARANCEUR CLEAR 03/16/2016 0219   LABSPEC 1.029 03/16/2016 0219   PHURINE 5.0 03/16/2016 0219   GLUCOSEU NEGATIVE 03/16/2016 0219   HGBUR SMALL (A)  03/16/2016 0219   BILIRUBINUR NEGATIVE 03/16/2016 0219   KETONESUR 5 (A) 03/16/2016 0219   PROTEINUR 30 (A) 03/16/2016 0219   NITRITE NEGATIVE 03/16/2016 0219   LEUKOCYTESUR NEGATIVE 03/16/2016 0219   Sepsis Labs: @LABRCNTIP (procalcitonin:4,lacticidven:4) )No results found for this or any previous visit (from the past 240 hour(s)).   Radiological Exams on Admission: Ct Abdomen Pelvis W Contrast  Result Date: 04/22/2016 CLINICAL DATA:  Crohn's disease. EXAM: CT ABDOMEN AND PELVIS WITH CONTRAST TECHNIQUE: Multidetector CT imaging of the abdomen and pelvis was performed using the standard protocol following bolus administration of intravenous contrast. CONTRAST:  1 ISOVUE-300 IOPAMIDOL (ISOVUE-300) INJECTION 61% COMPARISON:  04/05/2016 FINDINGS: Lower chest: The lung bases are clear.  No pleural effusion. Hepatobiliary: No focal liver abnormality is seen. No gallstones, gallbladder wall thickening, or biliary dilatation. Pancreas: Unremarkable. No pancreatic ductal dilatation or surrounding inflammatory changes. Spleen: Normal in size without focal abnormality. Adrenals/Urinary Tract: Adrenal glands are unremarkable. Kidneys are normal, without renal calculi, focal lesion, or hydronephrosis. Bladder is unremarkable. Stomach/Bowel: The stomach appears normal. There is persistent and progressive inflammatory changes involving the right lower quadrant bowel loops including the terminal ileum. Evidence of persistent and recurrent penetrating disease with fistula formation is identified 2 previously drained abscess cavity, image 64 series 2. There is a recurrent abscess within the right lower quadrant which extends into the ileo psoas muscle. 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 Right lower quadrant abscess measures 7.2 by 3.0 by 3.9 cm, image 65 of series 2 and image 57 of series 3.  Enterocutaneous fistula/ sinus tract extending to the anterior aspect of the upper thigh it measures approximately 11.3 cm, image 45 of series 3. No evidence for bowel obstruction. Vascular/Lymphatic: No significant vascular findings are present. No enlarged abdominal or pelvic lymph nodes. Reproductive: Uterus and bilateral adnexa are unremarkable. Other: No abdominal wall hernia or abnormality. No abdominopelvic ascites. Musculoskeletal: No acute or significant osseous findings. IMPRESSION: 1. Examination is positive for persistent progressive inflammatory changes of Crohn's enteritis involving the right lower quadrant bowel loops. 2. 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 3. No evidence for bowel obstruction. Electronically Signed   By: Kerby Moors M.D.   On: 04/22/2016 21:49    EKG: Not performed, will obtain as appropriate.   Assessment/Plan  1. Crohn's disease with abscess and fistula - Pt presents with increasing pain in right groin and pelvis, chills, and right groin mass -  Has been on caspofungin and Rocephin via PICC in RUE for abscess involving psoas muscle; drain had been placed 03/21/16, pulled 04/05/16 as abscesses had nearly resolved  - Per recent GI consultation, will need biologic therapy, but hesitant to start until abscesses resolved, and will likely require resection at this point in order to achieve that; pt had seen [facility] surgeon in outpatient consultation and had hoped to avoid surgery with continued medical therapy; now, worsening despite IV abx, he wishes to pursue surgery if indicated  - Afebrile here, reports chills at home; WBC 14,300 and slightly up from 2 wks ago, lactic acid reassuring - CT abd/pelvis with progressive inflammatory change involving RLQ bowel loops, recurrent  penetrating disease with abscess in RLQ involving psoas muscle and with interval development of enterocutaneous fistula to upper right thigh anteriorly - Blood cultures obtained, caspofungin continued, Zosyn started; follow cultures, clinical response to therapy, inflammatory markers  - Gen surg consulted by EDP, do not feel there is a role for surgery at this time - GI consultation pending, will follow-up recommendations   2. Microcytic anemia  - Hgb is 10.2 on admission, down from 11.2 two weeks ago  - MCV has decreased to 76.8 - Likely IDA secondary to Crohn's; check anemia panel   3. Leukocytosis, thrombocytosis  - WBC elevated to 14,300 and platelets elevated to 463,000 on admission; both indices increased slightly from 2 wks prior  - Likely secondary to acute infectious/inflammatory process, which is being addressed as above    DVT prophylaxis: sq Lovenox  Code Status: Full  Family Communication: Discussed with patient Disposition Plan: Admit to med-surg Consults called: GI, gen surgery Admission status: Inpatient    Vianne Bulls, MD [facility] Pager [phone]  If 7PM-7AM, please contact night-coverage [email or web address] Password [facility]  04/22/2016, 11:31 PM

## 2016-04-22 NOTE — Progress Notes (Signed)
Pharmacy Antibiotic Note  Larry [name] is a 36 y.o. male c/o lower abdominal pain and pelvic area being treated outpatient on caspofungin and rocephin since 2/16   admitted on 04/22/2016 with intra-abdominal infection.  Pharmacy has been consulted for zosyn dosing.  Plan: Zosyn 3.375g IV q8h (4 hour infusion).  Height: 6' (182.9 cm) Weight: 163 lb (73.9 kg) IBW/kg (Calculated) : 77.6  Temp (24hrs), Avg:98.1 F (36.7 C), Min:97.9 F (36.6 C), Max:98.3 F (36.8 C)   Recent Labs Lab 04/22/16 1808 04/22/16 1819  WBC 14.3*  --   CREATININE 0.82  --   LATICACIDVEN  --  1.26    Estimated Creatinine Clearance: 133.9 mL/min (by C-G formula based on SCr of 0.82 mg/dL).    No Known Allergies  Antimicrobials this admission: 2/16 Caspofungin PTA >>  2/26 zosyn >>   Dose adjustments this admission:   Microbiology results:  BCx:   UCx:    Sputum:    MRSA PCR:   Thank you for allowing pharmacy to be a part of this patient's care.  Dorrene German 04/22/2016 11:46 PM

## 2016-04-22 NOTE — ED Triage Notes (Signed)
Pt c/o knott on right side lower abdomen/pelvic area noticed on Friday. Reports pain shooting into leg. Pt was admitted 18th for bowel abscess that required drain right above the area. Pt has PICC line in right arm and is taking IV antibiotics (rocephin).

## 2016-04-23 DIAGNOSIS — K50113 Crohn's disease of large intestine with fistula: Secondary | ICD-10-CM

## 2016-04-23 DIAGNOSIS — D473 Essential (hemorrhagic) thrombocythemia: Secondary | ICD-10-CM

## 2016-04-23 DIAGNOSIS — K50014 Crohn's disease of small intestine with abscess: Secondary | ICD-10-CM

## 2016-04-23 DIAGNOSIS — D509 Iron deficiency anemia, unspecified: Secondary | ICD-10-CM

## 2016-04-23 LAB — GLUCOSE, CAPILLARY: GLUCOSE-CAPILLARY: 94 mg/dL (ref 65–99)

## 2016-04-23 LAB — CBC
HCT: 30.5 % — ABNORMAL LOW (ref 39.0–52.0)
Hemoglobin: 9.6 g/dL — ABNORMAL LOW (ref 13.0–17.0)
MCH: 25.1 pg — ABNORMAL LOW (ref 26.0–34.0)
MCHC: 31.5 g/dL (ref 30.0–36.0)
MCV: 79.8 fL (ref 78.0–100.0)
PLATELETS: 405 10*3/uL — AB (ref 150–400)
RBC: 3.82 MIL/uL — ABNORMAL LOW (ref 4.22–5.81)
RDW: 14.5 % (ref 11.5–15.5)
WBC: 12.7 10*3/uL — AB (ref 4.0–10.5)

## 2016-04-23 LAB — COMPREHENSIVE METABOLIC PANEL
ALK PHOS: 40 U/L (ref 38–126)
ALT: 9 U/L — AB (ref 17–63)
AST: 12 U/L — AB (ref 15–41)
Albumin: 3.3 g/dL — ABNORMAL LOW (ref 3.5–5.0)
Anion gap: 7 (ref 5–15)
BILIRUBIN TOTAL: 0.6 mg/dL (ref 0.3–1.2)
BUN: 13 mg/dL (ref 6–20)
CO2: 27 mmol/L (ref 22–32)
CREATININE: 1.01 mg/dL (ref 0.61–1.24)
Calcium: 9.2 mg/dL (ref 8.9–10.3)
Chloride: 105 mmol/L (ref 101–111)
GFR calc Af Amer: >60 mL/min (ref >60)
GFR calc non Af Amer: >60 mL/min (ref >60)
Glucose, Bld: 94 mg/dL (ref 65–99)
Potassium: 3.8 mmol/L (ref 3.5–5.1)
Sodium: 139 mmol/L (ref 135–145)
TOTAL PROTEIN: 7.7 g/dL (ref 6.5–8.1)

## 2016-04-23 MED ORDER — DEXTROSE-NACL 5-0.9 % IV SOLN
INTRAVENOUS | Status: DC
  Administered 2016-04-23 – 2016-04-24 (×3): via INTRAVENOUS

## 2016-04-23 MED ORDER — SODIUM CHLORIDE 0.9% FLUSH: 10.0000 mL | INTRAVENOUS | Status: DC | PRN

## 2016-04-23 MED ORDER — ALTEPLASE 2 MG IJ SOLR
2.0000 mg | Freq: Once | INTRAMUSCULAR | Status: AC
  Administered 2016-04-23: 2 mg
  Filled 2016-04-23: qty 2

## 2016-04-23 MED ORDER — SODIUM CHLORIDE 0.9 % IV SOLN
100.0000 mg | INTRAVENOUS | Status: DC
  Administered 2016-04-23 – 2016-04-24 (×2): 100 mg via INTRAVENOUS
  Filled 2016-04-23 (×2): qty 100

## 2016-04-23 NOTE — Progress Notes (Signed)
[facility]  Larry [name] is an active pt with [name].  Pt is receiving [place] services thru [facility] at  [phone].  Current Home Infusion Orders: Ceftriaxone 2 Grams IV Q 24 hours Cancidas 50 mg IV Q 24 hours- both IV meds thru 04-26-16.  [facility] Infusion Coordinator will follow pt during this hospital admission to support DC home when ordered.  If patient discharges after hours, please call [phone].   Larry Sierras 04/23/2016, 10:12 AM

## 2016-04-23 NOTE — Progress Notes (Signed)
PROGRESS NOTE  Larry [name]  GSU:[id number] DOB: [birth year] DOA: 04/22/2016 PCP: Pcp Not In System  Outpatient Specialists: [facility], Dr. Havery Moros Infectious Disease, Dr. Baxter Flattery General Surgery, Dr. Drue Flirt  Brief Narrative: Larry [name] is a 36 y.o. male with a medical history significant for Crohn's disease and chronic microcytic anemia who presented to the emergency department for evaluation of right lower quadrant and right groin pain with fevers and chills. The patient had been admitted to the hospital from 03/16/2016 until 03/27/2016 and managed for penetrating Crohn's disease with abscess involving the right psoas. He had a drain placed by IR, was treated with antibiotics, and was discharged with PICC to continue antibiotics. GI consultants were hesitant to start biologic therapy prior to resolution of the abscesses and advised that he may need a resection prior to starting treatment. Drain was pulled on 04/05/2016 after repeat imaging demonstrated improvement in the abscesses. He had persistent elevation in WBC and inflammatory markers and his antibiotic course was extended by ID. He was seen by surgery at [facility], but declined resection at this time, hoping to improve with medical therapy and get back to work. Three days ago, the patient noted a tender "knot" in the right groin. He had been experiencing pain in the right pelvis with ambulation for the past month, but this worsened significantly over the past several days. He reports fevers and chills at home.  Upon arrival to the ED, the patient was found to be afebrile, saturating well on room air, tachycardic in the 110s, and with vitals otherwise stable. Chemistry panel was largely unremarkable and CBC was notable for a leukocytosis to 14,300, slightly increased from 2 weeks ago, microcytic anemia with hemoglobin 10.2, down from 11.2 two weeks prior, and a thrombocytosis which has progressed in the past 2 weeks to  463,000. Lactic acid was reassuring at 1.26. CT of the abdomen and pelvis demonstrated persistent progressive inflammatory changes of Crohn's involving the right lower quadrant bowel loops and progressive penetrating disease with recurrent RLQ abscess involving the psoas. 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 He was given IV analgesics in the ED and GI and general surgery were consulted by the ED provider. He was admitted for Crohn's disease with abscess and new enterocutaneous fistula formation despite IV antimicrobial therapy.  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 Had received caspofungin and ceftriaxone (switched to ertapenem by ID 2/15) through PICC in RUE for abscess involving psoas muscle; drain had been placed 03/21/16, pulled 04/05/16 as abscesses had nearly resolved.  - With interval worsening of disease despite IV antimicrobial therapy, suspect surgical resection will be required. GI is contacting IBD surgery, Dr. Drue Flirt at [facility] for transfer. The patient wishes to pursue surgery if indicated. - Holding biologics with active infection.  - Blood cultures obtained 2/25 - Continuing zosyn and anidulafungin per ID recommendations.   Microcytic anemia: Hgb is 10.2 on admission, down from 11.2 two weeks ago. MCV has decreased to 76.8 - Monitor, no indication for transfusion. - Check anemia panel  Leukocytosis, thrombocytosis: WBC elevated  to 14,300 and platelets elevated to 463,000 on admission; both indices increased slightly from 2 wks prior. Likely secondary to acute infectious/inflammatory process, which is being addressed as above  - Monitor  DVT prophylaxis: Lovenox Code Status: Full Family Communication: None at bedside this AM Disposition Plan: Transfer to [facility] for surgical specialty services (likely to require complex operative management by IBD surgery)  Consultants:   [facility]  Infectious Disease, Dr. Megan Salon  General Surgery  Procedures:   PICC double lumen in RUE (placed 03/17/2016)  Antimicrobials:  Zosyn 2/25 >>  Anidulafungin 2/25 >>  Subjective: Patient reports improved redness on anterior thigh with controlled pain. Tmax 98.25F.    Objective: Vitals:   04/22/16 1912 04/22/16 2323 04/23/16 0430 04/23/16 0500  BP: 97/68 100/61 (!) 93/55   Pulse: 84 92 100   Resp: 18 18 18    Temp:  98.3 F (36.8 C) 98.3 F (36.8 C)   TempSrc:  Oral Oral   SpO2: 100% 95% 95%   Weight:    75.1 kg (165 lb 9.6 oz)  Height:  6' (1.829 m)      Intake/Output Summary (Last 24 hours) at 04/23/16 1311 Last data filed at 04/23/16 0820  Gross per 24 hour  Intake           614.67 ml  Output              950 ml  Net          -335.33 ml   Filed Weights   04/22/16 1657 04/23/16 0500  Weight: 73.9 kg (163 lb) 75.1 kg (165 lb 9.6 oz)    Examination: General exam: Well-developed, well-nourished 36yo male in mild distress. Respiratory system: Non-labored breathing room air. Clear to auscultation bilaterally.  Cardiovascular system: Regular rate and rhythm. No murmur, rub, or gallop. No JVD, and no pedal edema. Gastrointestinal system: Abdomen soft with moderate RLQ tenderness without rebound, non-distended, with normoactive bowel sounds. No organomegaly or masses felt. Central nervous system: Alert and oriented. No focal neurological deficits. Extremities: Warm, no deformities Skin: Approximately 6cm x 2cm  transversely oriented erythematous induration on right superior anterior thigh is tender to palpation without fluctuance or drainage. Psychiatry: Judgement and insight appear normal. Mood & affect appropriate.   Data Reviewed: I have personally reviewed following labs and imaging studies  CBC:  Recent Labs Lab 04/22/16 1808 04/23/16 0417  WBC 14.3* 12.7*  NEUTROABS 11.8*  --   HGB 10.2* 9.6*  HCT 31.1* 30.5*  MCV 76.8* 79.8  PLT 463* 814*   Basic Metabolic Panel:  Recent Labs Lab 04/22/16 1808 04/23/16 0418  NA 137 139  K 3.5 3.8  CL 106 105  CO2 22 27  GLUCOSE 96 94  BUN 13 13  CREATININE 0.82 1.01  CALCIUM 8.9 9.2   GFR: Estimated Creatinine Clearance: 110.5 mL/min (by C-G formula based on SCr of 1.01 mg/dL). Liver Function Tests:  Recent Labs Lab 04/22/16 1808 04/23/16 0418  AST 14* 12*  ALT 10* 9*  ALKPHOS 46 40  BILITOT 0.5 0.6  PROT 8.3* 7.7  ALBUMIN 3.3* 3.3*   No results for input(s): LIPASE, AMYLASE in the last 168 hours. No results for input(s): AMMONIA in the last 168 hours. Coagulation Profile: No results for input(s): INR, PROTIME in the last 168 hours. Cardiac Enzymes: No results for input(s): CKTOTAL, CKMB, CKMBINDEX, TROPONINI in the last 168 hours. BNP (last 3 results) No results for input(s): PROBNP in the last  8760 hours. HbA1C: No results for input(s): HGBA1C in the last 72 hours. CBG:  Recent Labs Lab 04/23/16 0738  GLUCAP 94   Lipid Profile: No results for input(s): CHOL, HDL, LDLCALC, TRIG, CHOLHDL, LDLDIRECT in the last 72 hours. Thyroid Function Tests: No results for input(s): TSH, T4TOTAL, FREET4, T3FREE, THYROIDAB in the last 72 hours. Anemia Panel: No results for input(s): VITAMINB12, FOLATE, FERRITIN, TIBC, IRON, RETICCTPCT in the last 72 hours. Urine analysis:    Component Value Date/Time   COLORURINE AMBER (A) 03/16/2016 0219   APPEARANCEUR CLEAR 03/16/2016 0219   LABSPEC 1.029 03/16/2016 0219   PHURINE 5.0  03/16/2016 0219   GLUCOSEU NEGATIVE 03/16/2016 0219   HGBUR SMALL (A) 03/16/2016 0219   BILIRUBINUR NEGATIVE 03/16/2016 0219   KETONESUR 5 (A) 03/16/2016 0219   PROTEINUR 30 (A) 03/16/2016 0219   NITRITE NEGATIVE 03/16/2016 0219   LEUKOCYTESUR NEGATIVE 03/16/2016 0219   No results found for this or any previous visit (from the past 240 hour(s)).    Radiology Studies: Ct Abdomen Pelvis W Contrast  Result Date: 04/22/2016 CLINICAL DATA:  Crohn's disease. EXAM: CT ABDOMEN AND PELVIS WITH CONTRAST TECHNIQUE: Multidetector CT imaging of the abdomen and pelvis was performed using the standard protocol following bolus administration of intravenous contrast. CONTRAST:  1 ISOVUE-300 IOPAMIDOL (ISOVUE-300) INJECTION 61% COMPARISON:  04/05/2016 FINDINGS: Lower chest: The lung bases are clear.  No pleural effusion. Hepatobiliary: No focal liver abnormality is seen. No gallstones, gallbladder wall thickening, or biliary dilatation. Pancreas: Unremarkable. No pancreatic ductal dilatation or surrounding inflammatory changes. Spleen: Normal in size without focal abnormality. Adrenals/Urinary Tract: Adrenal glands are unremarkable. Kidneys are normal, without renal calculi, focal lesion, or hydronephrosis. Bladder is unremarkable. Stomach/Bowel: The stomach appears normal. There is persistent and progressive inflammatory changes involving the right lower quadrant bowel loops including the terminal ileum. Evidence of persistent and recurrent penetrating disease with fistula formation is identified 2 previously drained abscess cavity, image 64 series 2. There is a recurrent abscess within the right lower quadrant which extends into the ileo psoas muscle. 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 Right lower quadrant abscess measures 7.2 by 3.0 by 3.9 cm, image 65 of series 2 and image 57 of series 3.  Enterocutaneous fistula/ sinus tract extending to the anterior aspect of the upper thigh it measures approximately 11.3 cm, image 45 of series 3. No evidence for bowel obstruction. Vascular/Lymphatic: No significant vascular findings are present. No enlarged abdominal or pelvic lymph nodes. Reproductive: Uterus and bilateral adnexa are unremarkable. Other: No abdominal wall hernia or abnormality. No abdominopelvic ascites. Musculoskeletal: No acute or significant osseous findings. IMPRESSION: 1. Examination is positive for persistent progressive inflammatory changes of Crohn's enteritis involving the right lower quadrant bowel loops. 2. 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 3. No evidence for bowel obstruction. Electronically Signed   By: Kerby Moors M.D.   On: 04/22/2016 21:49    Scheduled Meds: . anidulafungin  100 mg Intravenous Q24H  . enoxaparin (LOVENOX) injection  40 mg Subcutaneous Q24H  . piperacillin-tazobactam (ZOSYN)  IV  3.375 g Intravenous Q8H   Continuous Infusions: . dextrose 5 % and 0.9% NaCl       LOS: 1 day   Time spent: 25 minutes.  Vance Gather, MD [facility] Pager [phone]  If 7PM-7AM, please contact night-coverage [email or web address] Password [email or web address] 04/23/2016, 1:11 PM

## 2016-04-23 NOTE — Progress Notes (Signed)
[facility] team has been alerted of possible transfer and will follow to support DC from [facility] is pt does transfer for care there.   If patient discharges after hours, please call [phone].   Larry Sierras 04/23/2016, 11:28 AM

## 2016-04-23 NOTE — Progress Notes (Signed)
MEDICATION RELATED CONSULT NOTE - INITIAL   Pharmacy sub for Caspofungin>Anidulafungin Indication: Intra-abdominal infection  No Known Allergies  Patient Measurements: Height: 6' (182.9 cm) Weight: 163 lb (73.9 kg) IBW/kg (Calculated) : 77.6   Vital Signs: Temp: 98.3 F (36.8 C) (02/26 0430) Temp Source: Oral (02/26 0430) BP: 93/55 (02/26 0430) Pulse Rate: 100 (02/26 0430) Intake/Output from previous day: No intake/output data recorded. Intake/Output from this shift: No intake/output data recorded.  Labs:  Recent Labs  04/22/16 1808 04/23/16 0417 04/23/16 0418  WBC 14.3* 12.7*  --   HGB 10.2* 9.6*  --   HCT 31.1* 30.5*  --   PLT 463* 405*  --   CREATININE 0.82  --  1.01  ALBUMIN 3.3*  --  3.3*  PROT 8.3*  --  7.7  AST 14*  --  12*  ALT 10*  --  9*  ALKPHOS 46  --  40  BILITOT 0.5  --  0.6   Estimated Creatinine Clearance: 108.7 mL/min (by C-G formula based on SCr of 1.01 mg/dL).   Microbiology: No results found for this or any previous visit (from the past 720 hour(s)).  Medical History: Past Medical History:  Diagnosis Date  . Crohn's disease ([place])     Plan:  Pt was on Caspofungin 50 mg daily outpatient this is non-formulary and we do not stock this medication.  Pharmacy substituted Anidulafungin 100 mg daily (equivalent dose)  Dorrene German 04/23/2016,6:26 AM

## 2016-04-23 NOTE — Consult Note (Signed)
Referring Provider: [facility] Primary Care Physician:  none Primary Gastroenterologist:  Jolly Mango,  MD  Reason for Consultation:  Complicated Crohn's   ASSESSMENT:  36 yo male with complicated ileal Crohn's disease on low dose imuran and low dose prednisone when we met him in patient in late January. At that time he presented with RLQ / psoas abscesses requiring drainage and prolonged IV antibiotics.  He improved with drainage but given long segment of ileal disease we felt surgery was in near future.  We referred him to IBD Surgery at [facility] but after discussion with Surgery patient preferred to try medical management over ileocectomy.  We have been working to get insurance approval for biologics but his condition has deteriorated in the interim. 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  PLAN:   Patient has aggressive Crohn's disease, he will likely come to a resection very soon. We like to have patient transferred to [facility] and have left a message for Dr. Drue Flirt with IBD Surgery regarding this.  I have updated the Hospitalist and will let him know when we hear back from Dr. Drue Flirt. For now, continue IV antibiotics, anti-fungal, analgesics, and DVT prophylaxis      HPI: Larry [name] is a 36 y.o. male with complicated small bowel Crohn's (TI) diagnosed in 2015 when he presented with abscesses. He had been under the care of an outside GI. He was admitted to [facility] in late January with recurrent RLQ abscesses on low dose Imuran and low dose Prednisone. CT can showed long segment of disease ileum and abscesses. He was started on TNA and IV antibiotics.  Initially abscesses were not felt to be amenable to drainage but repeat imaging a few days later showed enlargement of the abscesses. He underwent CT guided perc drain placement. Surgery followed but didn't recommend surgical evaluation at the time. Patient felt  significantly better after IR drainage. Cx grew e.coli and saccharomyces cerevisiae. ID consulted, antibiotics changed to ceftriaxone. Patient was eventually discharged home on IV ceftriaxone and caspofungin (to cover polymicrobial infection). Drain was removed on 2/8. Repeat CTscan on 2/8 showed improvement in the abscesses and drain was removed. We saw him in the office for hospital follow up on 2/15, he described recurrent / ongoing RLQ pain. His white count was  elevated at 14. At some point following that he was changed to ertapenem. Patient saw ID 2/15, plan was to continue IV abtx + caspofungin for 2 more weeks. We referred patient to Dr. Renelda Mom, IBD Surgery specialist at [facility] on 04/15/16. Ileocectomy was discussed, patient preferred to continue medical management as he needed to return to work and wife had just had a baby.  Outpatient PPD was placed in our office In preparation for eventual biologics ( indeterminate quantiferon gold in hospital) .  Last Thursday patient developed worsening RLQ pain. His bowel movements were at baseline, no urinary symptoms. On Friday he had a large, swollen and erythematous area in RLQ and Sat. Temp was 100.3.   He presented to ED last night. WBC 14.3, CTscan revealed progressive inflammatory changes of Crohn's involving RLQ bowel loops, progressive penetrating disease with recurrent RLQ abscess involving psoas muscle. In the interim he has developed a enterocutaneous fistula to the anterior aspect of right upper thigh. He is currently on Zosyn plus anti-fungal.   Past Medical History:  Diagnosis Date  . Crohn's disease [facility])     Past Surgical History:  Procedure  Laterality Date  . bowel abscess from crohns     . IR GENERIC HISTORICAL  04/05/2016   IR RADIOLOGIST EVAL & MGMT 04/05/2016 Sandi Mariscal, MD [facility] INTERV RAD    Prior to Admission medications   Medication Sig Start Date End Date Taking? Authorizing Provider  acetaminophen (TYLENOL) 500 MG tablet  Take 1,000 mg by mouth every 6 (six) hours as needed.   Yes Historical Provider, MD  caspofungin (CANCIDAS) 50 MG injection Inject 50 mg into the vein daily. 03/28/16  Yes Historical Provider, MD  cefTRIAXone (ROCEPHIN) 2-2.22 GM-% IVPB Inject 2 g into the vein daily. 03/28/16  Yes Historical Provider, MD  Oxycodone HCl 10 MG TABS Take 1 tablet (10 mg total) by mouth every 6 (six) hours. 04/12/16  Yes Carlyle Basques, MD  acetaminophen (TYLENOL) 325 MG tablet Take 2 tablets (650 mg total) by mouth every 6 (six) hours as needed for mild pain, moderate pain or fever. Patient not taking: Reported on 04/22/2016 03/27/16   Modena Jansky, MD  caspofungin 50 mg in sodium chloride 0.9 % 250 mL Inject 50 mg into the vein daily. Stop after 04/10/2016 dose. Patient not taking: Reported on 04/22/2016 03/28/16   Modena Jansky, MD  cefTRIAXone 2 g in dextrose 5 % 50 mL Inject 2 g into the vein daily. Stop after 04/10/16 dose. Patient not taking: Reported on 04/22/2016 03/28/16   Modena Jansky, MD  Sodium Chloride Flush (SALINE FLUSH) 0.9 % SOLN RLQ JP drain care: rec once daily flush with 5- 10 mls sterile NS, recording of output and dressing changes twice daily. Patient not taking: Reported on 04/22/2016 03/27/16   Modena Jansky, MD    Current Facility-Administered Medications  Medication Dose Route Frequency Provider Last Rate Last Dose  . 0.9 %  sodium chloride infusion   Intravenous Continuous Vianne Bulls, MD 110 mL/hr at 04/23/16 0052    . acetaminophen (TYLENOL) tablet 650 mg  650 mg Oral Q6H PRN Vianne Bulls, MD       Or  . acetaminophen (TYLENOL) suppository 650 mg  650 mg Rectal Q6H PRN Vianne Bulls, MD      . anidulafungin (ERAXIS) 100 mg in sodium chloride 0.9 % 100 mL IVPB  100 mg Intravenous Q24H Patrecia Pour, MD      . enoxaparin (LOVENOX) injection 40 mg  40 mg Subcutaneous Q24H Ilene Qua Opyd, MD      . HYDROmorphone (DILAUDID) injection 0.5-1 mg  0.5-1 mg Intravenous Q3H PRN Vianne Bulls, MD   1 mg at 04/23/16 0809  . ondansetron (ZOFRAN) tablet 4 mg  4 mg Oral Q6H PRN Vianne Bulls, MD       Or  . ondansetron (ZOFRAN) injection 4 mg  4 mg Intravenous Q6H PRN Vianne Bulls, MD      . piperacillin-tazobactam (ZOSYN) IVPB 3.375 g  3.375 g Intravenous Q8H Dorrene German, RPH   3.375 g at 04/23/16 0809  . sodium chloride flush (NS) 0.9 % injection 10-40 mL  10-40 mL Intracatheter PRN Vianne Bulls, MD        Allergies as of 04/22/2016  . (No Known Allergies)    History reviewed. No pertinent family history.  Social History   Social History  . Marital status: Single    Spouse name: N/A  . Number of children: N/A  . Years of education: N/A   Occupational History  . Not on file.   Social  History Main Topics  . Smoking status: Never Smoker  . Smokeless tobacco: Never Used  . Alcohol use No  . Drug use: No  . Sexual activity: Not on file   Other Topics Concern  . Not on file   Social History Narrative  . No narrative on file    Review of Systems: All systems reviewed and negative except where noted in HPI.  Physical Exam: Vital signs in last 24 hours: Temp:  [97.9 F (36.6 C)-98.3 F (36.8 C)] 98.3 F (36.8 C) (02/26 0430) Pulse Rate:  [84-117] 100 (02/26 0430) Resp:  [18] 18 (02/26 0430) BP: (93-116)/(55-80) 93/55 (02/26 0430) SpO2:  [95 %-100 %] 95 % (02/26 0430) Weight:  [163 lb (73.9 kg)-165 lb 9.6 oz (75.1 kg)] 165 lb 9.6 oz (75.1 kg) (02/26 0500) Last BM Date: 04/21/16 General:   Alert, thin black male in NAD Eyes:  Pupils equal, sclera clear, no icterus.   Conjunctiva pink. Ears:  Normal auditory acuity. Nose:  No deformity, discharge,  or lesions. Neck:  Supple; no masses or thyromegaly. Lungs:  Clear throughout to auscultation.   No wheezes, crackles, or rhonchi.  Heart:  Regular rate and rhythm; no murmurs, clicks, rubs,  or gallops. Abdomen:  Soft, moderate RLQ tenderness where there is a firm, erythematous lesion approx 2  inches long, 1/2 inch wide.  BS active,  Rectal:  Deferred  Msk:  Symmetrical without gross deformities. . Pulses:  Normal pulses noted. Extremities:  Without clubbing or edema. Neurologic:  Alert and  oriented x4;  grossly normal neurologically. Skin:  Intact without significant lesions or rashes.. Psych:  Alert and cooperative. Normal mood and affect.  Intake/Output from previous day: 02/25 0701 - 02/26 0700 In: 614.7 [I.V.:564.7; IV Piggyback:50] Out: 500 [Urine:500] Intake/Output this shift: Total I/O In: 0  Out: 450 [Urine:450]  Lab Results:  Recent Labs  04/22/16 1808 04/23/16 0417  WBC 14.3* 12.7*  HGB 10.2* 9.6*  HCT 31.1* 30.5*  PLT 463* 405*   BMET  Recent Labs  04/22/16 1808 04/23/16 0418  NA 137 139  K 3.5 3.8  CL 106 105  CO2 22 27  GLUCOSE 96 94  BUN 13 13  CREATININE 0.82 1.01  CALCIUM 8.9 9.2   LFT  Recent Labs  04/23/16 0418  PROT 7.7  ALBUMIN 3.3*  AST 12*  ALT 9*  ALKPHOS 40  BILITOT 0.6    Studies/Results: Ct Abdomen Pelvis W Contrast  Result Date: 04/22/2016 CLINICAL DATA:  Crohn's disease. EXAM: CT ABDOMEN AND PELVIS WITH CONTRAST TECHNIQUE: Multidetector CT imaging of the abdomen and pelvis was performed using the standard protocol following bolus administration of intravenous contrast. CONTRAST:  1 ISOVUE-300 IOPAMIDOL (ISOVUE-300) INJECTION 61% COMPARISON:  04/05/2016 FINDINGS: Lower chest: The lung bases are clear.  No pleural effusion. Hepatobiliary: No focal liver abnormality is seen. No gallstones, gallbladder wall thickening, or biliary dilatation. Pancreas: Unremarkable. No pancreatic ductal dilatation or surrounding inflammatory changes. Spleen: Normal in size without focal abnormality. Adrenals/Urinary Tract: Adrenal glands are unremarkable. Kidneys are normal, without renal calculi, focal lesion, or hydronephrosis. Bladder is unremarkable. Stomach/Bowel: The stomach appears normal. There is persistent and progressive  inflammatory changes involving the right lower quadrant bowel loops including the terminal ileum. Evidence of persistent and recurrent penetrating disease with fistula formation is identified 2 previously drained abscess cavity, image 64 series 2. There is a recurrent abscess within the right lower quadrant which extends into the ileo psoas muscle. There is also a enterocutaneous fistula  or sinus tract which extends towards the anterior aspect of the right upper thigh, image number 83 of series 2. Right lower quadrant abscess measures 7.2 by 3.0 by 3.9 cm, image 65 of series 2 and image 57 of series 3. Enterocutaneous fistula/ sinus tract extending to the anterior aspect of the upper thigh it measures approximately 11.3 cm, image 45 of series 3. No evidence for bowel obstruction. Vascular/Lymphatic: No significant vascular findings are present. No enlarged abdominal or pelvic lymph nodes. Reproductive: Uterus and bilateral adnexa are unremarkable. Other: No abdominal wall hernia or abnormality. No abdominopelvic ascites. Musculoskeletal: No acute or significant osseous findings. IMPRESSION: 1. Examination is positive for persistent progressive inflammatory changes of Crohn's enteritis involving the right lower quadrant bowel loops. 2. 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 3. No evidence for bowel obstruction. Electronically Signed   By: Kerby Moors M.D.   On: 04/22/2016 21:49    Tye Savoy, NP-C @  04/23/2016, 9:35 AM  Pager number [phone]

## 2016-04-23 NOTE — Progress Notes (Signed)
Patient ID: Diaz Crago, male   DOB: 1980-07-26, 36 y.o.   MRN: [id number]          [facility] [facility]  Date of Admission:  04/22/2016   Total days of antibiotics 39        Day 1 piperacillin tazobactam        Day 1 anidulafungin        Principal Problem:   Crohn's disease of ileum with abscess ([place]) Active Problems:   Microcytic anemia   Crohn's colitis, unspecified complication ([place])   Crohn's colitis, with fistula ([place])   Thrombocytosis ([place])   . anidulafungin  100 mg Intravenous Q24H  . enoxaparin (LOVENOX) injection  40 mg Subcutaneous Q24H  . piperacillin-tazobactam (ZOSYN)  IV  3.375 g Intravenous Q8H    SUBJECTIVE: Mr. Figley is a 36 year old with a history of Crohn's disease who was hospitalized on 03/16/2016 with terminal ileitis and proximal colitis complicated by abscess formation. He was treated with empiric piperacillin tazobactam then underwent drain placement on 03/21/2016. Cultures grew Escherichia coli and Saccharomyces. He was discharged on IV ceftriaxone and caspofungin. A follow-up CT scan on 04/05/2016 showed improvement and his drain was removed. He was seen in follow-up by my partner, Dr. Carlyle Basques, who elected to continue those therapies. He saw Dr. Renelda Mom, a general surgeon at [facility] on 04/13/2016. He declined surgical intervention because he has a new child at home with his wife. He was to start Humira soon but was readmitted yesterday after he began having painful swelling in his right groin.  Review of Systems: Review of Systems  Constitutional: Positive for chills, diaphoresis, fever, malaise/fatigue and weight loss.  Respiratory: Negative for cough and shortness of breath.   Cardiovascular: Negative for chest pain.  Gastrointestinal: Positive for abdominal pain. Negative for constipation, diarrhea, nausea and vomiting.  Skin: Negative for rash.    Past Medical History:    Diagnosis Date  . Crohn's disease [facility])     Social History  Substance Use Topics  . Smoking status: Never Smoker  . Smokeless tobacco: Never Used  . Alcohol use No    History reviewed. No pertinent family history. No Known Allergies  OBJECTIVE: Vitals:   04/22/16 1912 04/22/16 2323 04/23/16 0430 04/23/16 0500  BP: 97/68 100/61 (!) 93/55   Pulse: 84 92 100   Resp: 18 18 18    Temp:  98.3 F (36.8 C) 98.3 F (36.8 C)   TempSrc:  Oral Oral   SpO2: 100% 95% 95%   Weight:    165 lb 9.6 oz (75.1 kg)  Height:  6' (1.829 m)     Body mass index is 22.46 kg/m.  Physical Exam  Constitutional: He is oriented to person, place, and time. No distress.  HENT:  Mouth/Throat: No oropharyngeal exudate.  Cardiovascular: Normal rate and regular rhythm.   No murmur heard. Pulmonary/Chest: Effort normal and breath sounds normal. He has no wheezes. He has no rales.  Abdominal: Soft. There is tenderness.  He has a healing scar at the site of his previous right lower quadrant drain. There is prominent, painful swelling in his right groin with overlying erythema.  Neurological: He is alert and oriented to person, place, and time.  Skin:  Right arm PICC site looks good.  Psychiatric: Mood and affect normal.    Lab Results Lab Results  Component Value Date   WBC 12.7 (H) 04/23/2016   HGB 9.6 (L) 04/23/2016   HCT  30.5 (L) 04/23/2016   MCV 79.8 04/23/2016   PLT 405 (H) 04/23/2016    Lab Results  Component Value Date   CREATININE 1.01 04/23/2016   BUN 13 04/23/2016   NA 139 04/23/2016   K 3.8 04/23/2016   CL 105 04/23/2016   CO2 27 04/23/2016    Lab Results  Component Value Date   ALT 9 (L) 04/23/2016   AST 12 (L) 04/23/2016   ALKPHOS 40 04/23/2016   BILITOT 0.6 04/23/2016     Microbiology: No results found for this or any previous visit (from the past 240 hour(s)).   ASSESSMENT: His repeat CT scan shows recurrence of the large right lower quadrant abscess extending to  the level of the skin. 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 He will most likely need surgery.  PLAN: 1. Continue piperacillin tazobactam and anidulafungin  Michel Bickers, MD [facility] for [facility] [place] [phone] pager   [phone] cell 04/23/2016, 12:04 PM

## 2016-04-24 DIAGNOSIS — L0291 Cutaneous abscess, unspecified: Secondary | ICD-10-CM

## 2016-04-24 LAB — CBC
HEMATOCRIT: 26.7 % — AB (ref 39.0–52.0)
Hemoglobin: 8.5 g/dL — ABNORMAL LOW (ref 13.0–17.0)
MCH: 25.4 pg — ABNORMAL LOW (ref 26.0–34.0)
MCHC: 31.8 g/dL (ref 30.0–36.0)
MCV: 79.9 fL (ref 78.0–100.0)
Platelets: 350 10*3/uL (ref 150–400)
RBC: 3.34 MIL/uL — ABNORMAL LOW (ref 4.22–5.81)
RDW: 14.5 % (ref 11.5–15.5)
WBC: 12.4 10*3/uL — ABNORMAL HIGH (ref 4.0–10.5)

## 2016-04-24 LAB — COMPREHENSIVE METABOLIC PANEL
ALBUMIN: 2.8 g/dL — AB (ref 3.5–5.0)
ALT: 8 U/L — ABNORMAL LOW (ref 17–63)
ANION GAP: 8 (ref 5–15)
AST: 11 U/L — ABNORMAL LOW (ref 15–41)
Alkaline Phosphatase: 38 U/L (ref 38–126)
BILIRUBIN TOTAL: 1 mg/dL (ref 0.3–1.2)
BUN: 9 mg/dL (ref 6–20)
CO2: 24 mmol/L (ref 22–32)
Calcium: 8.8 mg/dL — ABNORMAL LOW (ref 8.9–10.3)
Chloride: 106 mmol/L (ref 101–111)
Creatinine, Ser: 0.79 mg/dL (ref 0.61–1.24)
GFR calc non Af Amer: >60 mL/min (ref >60)
GLUCOSE: 104 mg/dL — AB (ref 65–99)
POTASSIUM: 3.6 mmol/L (ref 3.5–5.1)
Sodium: 138 mmol/L (ref 135–145)
TOTAL PROTEIN: 6.9 g/dL (ref 6.5–8.1)

## 2016-04-24 LAB — GLUCOSE, CAPILLARY
GLUCOSE-CAPILLARY: 95 mg/dL (ref 65–99)
GLUCOSE-CAPILLARY: 75 mg/dL (ref 65–99)
GLUCOSE-CAPILLARY: 98 mg/dL (ref 65–99)
Glucose-Capillary: 116 mg/dL — ABNORMAL HIGH (ref 65–99)

## 2016-04-24 LAB — PROTIME-INR
INR: 1.4
PROTHROMBIN TIME: 17.3 s — AB (ref 11.4–15.2)

## 2016-04-24 MED ORDER — HYDROMORPHONE HCL 1 MG/ML IJ SOLN
1.0000 mg | INTRAMUSCULAR | Status: DC | PRN
  Administered 2016-04-24 – 2016-04-25 (×2): 1 mg via INTRAVENOUS
  Filled 2016-04-24 (×2): qty 1

## 2016-04-24 MED ORDER — TRACE MINERALS CR-CU-MN-SE-ZN 10-1000-500-60 MCG/ML IV SOLN
INTRAVENOUS | Status: DC
  Administered 2016-04-24: 18:00:00 via INTRAVENOUS
  Filled 2016-04-24: qty 960

## 2016-04-24 MED ORDER — HYDROMORPHONE HCL 2 MG/ML IJ SOLN: 0.5000 mg | INTRAMUSCULAR | 0 refills | Status: DC | PRN

## 2016-04-24 MED ORDER — FAT EMULSION 20 % IV EMUL
240.0000 mL | INTRAVENOUS | Status: DC
  Administered 2016-04-24: 240 mL via INTRAVENOUS
  Filled 2016-04-24: qty 240

## 2016-04-24 MED ORDER — DEXTROSE-NACL 5-0.9 % IV SOLN
INTRAVENOUS | Status: DC
  Administered 2016-04-24: 20:00:00 via INTRAVENOUS

## 2016-04-24 MED ORDER — PIPERACILLIN-TAZOBACTAM 3.375 G IVPB: 3.3750 g | Freq: Three times a day (TID) | INTRAVENOUS | Status: DC

## 2016-04-24 MED ORDER — INSULIN ASPART 100 UNIT/ML ~~LOC~~ SOLN: 0.0000 [IU] | Freq: Four times a day (QID) | SUBCUTANEOUS | Status: DC

## 2016-04-24 MED ORDER — SODIUM CHLORIDE 0.9 % IV SOLN: 100.0000 mg | INTRAVENOUS | Status: DC

## 2016-04-24 MED ORDER — HYDROMORPHONE HCL 2 MG/ML IJ SOLN
1.0000 mg | INTRAMUSCULAR | Status: DC | PRN
  Administered 2016-04-24 (×2): 1 mg via INTRAVENOUS
  Filled 2016-04-24 (×2): qty 1

## 2016-04-24 NOTE — Progress Notes (Signed)
PHARMACY - ADULT TOTAL PARENTERAL NUTRITION CONSULT NOTE   Pharmacy Consult for TPN Indication: Crohn's disease and need for extended bowel rest with possible surgery  Patient Measurements: Height: 6' (182.9 cm) Weight: 165 lb 9.6 oz (75.1 kg) IBW/kg (Calculated) : 77.6 TPN AdjBW (KG): 73.9 Body mass index is 22.46 kg/m.  Insulin Requirements: n/a  Current Nutrition: NPO except ice chips  IVF: D5NS at 110 ml/hr  Central access: PICC (already has) TPN start date: 2/27  ASSESSMENT                                                                                                          HPI: 36 yo male with fistulizing Crohn's complicated by intra-abdominal and iliopsoas muscle abscesses abx management per ID. Plan per GI to consult colorectal surgery at [facility] where patient has previously been, ask IR to place perc drain, and start TPN as patient will need extended bowel rest  Significant events:   Today, 04/24/16 Baseline Labs  Glucose - WNL  Electrolytes -WNL  Renal -stable CrCl > 100  LFTs - slightly low  TGs - will check 2/28  Prealbumin - will check 2/28  NUTRITIONAL GOALS                                                                                             RD recs: not assessed yet  If could use 100% goals and Clinimix was not on back order, goals would be estimated as Clinimix E 5/15 at a goal rate of 48m/hr + 20% fat emulsion at 273mhr over 12 hrs to provide: 108g/day protein, 2013Kcal/day.  BUT, due to shortages of TPN, will limit goal to 8344mr (~2L/day) to provide 100g protein daily and 1912 kcal daily  PLAN                                                                                                                         At 1800 today:  Start Clinimix E 5/15 at 65m17m.  20% fat emulsion at 20ml57mover 12 hrs  Plan to advance as tolerated to the goal rate.  TPN to contain standard multivitamins and trace elements.  Reduce  IVF from 110  ml/hr to 39m/hr.  Add CBG/SSI q6h  TPN lab panels on Mondays & Thursdays.  F/u daily.   JAdrian Saran PharmD, BCPS Pager [phone]/27/2018 10:28 AM

## 2016-04-24 NOTE — Progress Notes (Signed)
Initial Nutrition Assessment  INTERVENTION:   TPN per Pharmacy RD will continue to monitor  NUTRITION DIAGNOSIS:   Inadequate oral intake related to inability to eat as evidenced by NPO status.  GOAL:   Patient will meet greater than or equal to 90% of their needs  MONITOR:   Labs, Weight trends, I & O's, Other (Comment) (TPN)  REASON FOR ASSESSMENT:   Consult New TPN/TNA  ASSESSMENT:   36 y.o. male with a medical history significant for Crohn's disease and chronic microcytic anemia who presented to the emergency department for evaluation of right lower quadrant and right groin pain with fevers and chills. The patient had been admitted to the hospital from 03/16/2016 until 03/27/2016 and managed for penetrating Crohn's disease with abscess involving the right psoas. He had a drain placed by IR, was treated with antibiotics, and was discharged with PICC to continue antibiotics. GI consultants were hesitant to start biologic therapy prior to resolution of the abscesses and advised that he may need a resection prior to starting treatment. Drain was pulled on 04/05/2016 after repeat imaging demonstrated improvement in the abscesses.   Patient familiar to RD from previous admission in January 2018. Pt states that he was eating well after his discharge a month ago and he was following a low fat diet with healthier choices, avoiding fried and fast foods. Pt states that a week PTA his appetite decreased with the worsening of his abscesses. Pt states he was barely eating during this time.  Pt has continue to lose weight, 43 lb since June 2017 (21% wt loss x 9 months, significant for time frame). Nutrition focused physical exam shows no sign of depletion of muscle mass or body fat.  Pt currently NPO for bowel rest. Pending transfer to [facility] for surgery. No beds available at this time. TPN to be initiated.  Labs reviewed. Medications: D5-.9% NaCl infusion at 60 ml/hr -provides 245  kcal  Plan per Pharmacy 2/27: At 1800 today:  Start Clinimix E 5/15 at 80m/hr.  20% fat emulsion at 232mhr over 12 hrs  Plan to advance as tolerated to the goal rate. Goal rate will be 8364mr (~2L/day) to provide 100g protein (100% of needs) and 1912 kcal (91% of needs) daily  Diet Order:  Diet NPO time specified Except for: Ice Chips TPN (CLINIMIX-E) Adult  Skin:  Reviewed, no issues  Last BM:  2/24  Height:   Ht Readings from Last 1 Encounters:  04/22/16 6' (1.829 m)    Weight:   Wt Readings from Last 1 Encounters:  04/23/16 165 lb 9.6 oz (75.1 kg)    Ideal Body Weight:  80.9 kg  BMI:  Body mass index is 22.46 kg/m.  Estimated Nutritional Needs:   Kcal:  2100-2300  Protein:  90-100g  Fluid:  2.1L/day  EDUCATION NEEDS:   No education needs identified at this time  LinClayton BiblesS, RD, LDN Pager: [phone] Hours Pager: [phone]

## 2016-04-24 NOTE — Progress Notes (Signed)
PT refused Lovenox.  Tye Savoy, NP and Dr. Lamount Cohen was notified.  Dr. Lamount Cohen will order SCD's.

## 2016-04-24 NOTE — Discharge Summary (Signed)
Physician Discharge Summary  Larry [name] KVQ:[id number] DOB: [birth year] DOA: 04/22/2016  PCP: Pcp Not In System  Admit date: 04/22/2016 Discharge date: 04/24/2016  Admitted From: Home Disposition: Transfer to [facility]  Discharge Condition: Stable for transfer CODE STATUS: Full Diet recommendation: NPO  Brief/Interim Summary: Larry Hollandis a 36 y.o.malewith a medical history significant for Crohn's disease and chronic microcytic anemia who presented to the emergency department for evaluation of right lower quadrant and right groin pain with fevers and chills. The patient had been admitted to the hospital from 03/16/2016 until 03/27/2016 and managed for penetrating Crohn's disease with abscess involving the right psoas. He had a drain placed by IR, was treated with antibiotics, and was discharged with PICC to continue antibiotics. GI consultants were hesitant to start biologic therapy prior to resolution of the abscesses and advised that he may need a resection prior to starting treatment. Drain was pulled on 04/05/2016 after repeat imaging demonstrated improvement in the abscesses. He had persistent elevation in WBC and inflammatory markers and his antibiotic course was extended by ID. He was seen by surgery at [facility], but declined resection at this time, hoping to improve with medical therapy and get back to work. Three days ago, the patient noted a tender "knot" in the right groin. He had been experiencing pain in the right pelvis with ambulation for the past month, but this worsened significantly over the past several days. He reports fevers and chills at home.  Upon arrival to the ED, the patient was found to be afebrile, saturating well on room air, tachycardic in the 110s, and with vitals otherwise stable. Chemistry panel was largely unremarkable and CBC was notable for a leukocytosis to 14,300, slightly increased from 2 weeks ago, microcytic anemia with hemoglobin 10.2, down  from 11.2 two weeks prior, and a thrombocytosis which has progressed in the past 2 weeks to 463,000. Lactic acid was reassuring at 1.26. CT of the abdomen and pelvis demonstrated persistent progressive inflammatory changes of Crohn's involving the right lower quadrant bowel loops and progressive penetrating disease with recurrent RLQ abscess involving the psoas. 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 He was given IV analgesics in the ED and GI and general surgery were consulted by the ED provider. He was admitted for Crohn's disease with abscess and new enterocutaneous fistula formation despite IV antimicrobial therapy. Zosyn and anidulafungin were administered per ID recommendations. Due to concern that the patient would be on prolonged bowel rest, TPN was started 2/27 through the patient's existing PICC. It was felt that surgical intervention would be required for definitive therapy. The patient had seen Dr. Drue [name] of [facility] in the outpatient setting where surgery was considered but declined at that time by the patient. On 2/27, the case was discussed with Dr. Morton Stall, colorectal surgeon at [facility], who accepted that patient for transfer. The patient is on the wait list pending bed availability and is stable for transport.  Discharge Diagnoses:  Principal Problem:   Crohn's disease of ileum with abscess ([place]) Active Problems:   Microcytic anemia   Crohn's colitis, with fistula (HCC)   Thrombocytosis (HCC)   Abscess  Crohn's disease with abscess and enterocutaneous fistula: CT abd/pelvis with progressive inflammatory change involving RLQ bowel loops, recurrent penetrating disease with abscess in RLQ involving psoas muscle and with interval development of enterocutaneous fistula to upper right thigh anteriorly. Had received caspofungin and ceftriaxone (switched to ertapenem by ID 2/15) through PICC in RUE  for abscess involving psoas muscle;  drain had been placed 03/21/16, pulled 04/05/16 as abscesses had nearly resolved.  - With interval worsening of disease despite IV antimicrobial therapy, suspect surgical resection will be required. Awaiting transfer to [facility] for IBD surgery, Dr. Drue [name], who is familiar with the patient. - In the meantime, IR is evaluating for perc drain as wait time for transfer is unknown. - Pharmacy consulted to start TPN thru PICC. Will require monitoring of CMP, Mg, Phos, triglycerides, prealbumin.  - Holding biologics and steroids.  - Blood cultures obtained 2/25 are no growth as of 2/27.  - Continuing zosyn and anidulafungin (started 04/22/2016) per ID recommendations.  - Double lumen PICC placed 03/17/2016  Microcytic anemia: Hgb is 10.2 on admission, down from 11.2 two weeks ago. MCV has decreased to 76.8 - Recommend to monitor daily, no indication for transfusion at this time. - Recommend to check anemia panel with AM labs and/or prior to any transfusions.   Leukocytosis, thrombocytosis: WBC elevated to 14,300 and platelets elevated to 463,000 on admission; both indices increased slightly from 2 wks prior. Likely secondary to acute infectious/inflammatory process, which is being addressed as above  - Monitoring daily  Discharge Instructions  Allergies as of 04/24/2016   No Known Allergies     Medication List    STOP taking these medications   caspofungin 50 mg in sodium chloride 0.9 % 250 mL   caspofungin 50 MG injection Commonly known as:  CANCIDAS   cefTRIAXone 2 g in dextrose 5 % 50 mL   cefTRIAXone 2-2.22 GM-% IVPB Commonly known as:  ROCEPHIN   Oxycodone HCl 10 MG Tabs   Saline Flush 0.9 % Soln     TAKE these medications   acetaminophen 500 MG tablet Commonly known as:  TYLENOL Take 1,000 mg by mouth every 6 (six) hours as needed.   acetaminophen 325 MG tablet Commonly known as:  TYLENOL Take 2 tablets (650 mg total) by mouth every 6 (six) hours as needed for mild pain,  moderate pain or fever.   anidulafungin 100 mg in sodium chloride 0.9 % 100 mL Inject 100 mg into the vein daily. Start taking on:  04/25/2016   HYDROmorphone 2 MG/ML injection Commonly known as:  DILAUDID Inject 0.25-0.5 mLs (0.5-1 mg total) into the vein every 3 (three) hours as needed for moderate pain or severe pain.   piperacillin-tazobactam 3.375 GM/50ML IVPB Commonly known as:  ZOSYN Inject 50 mLs (3.375 g total) into the vein every 8 (eight) hours.       No Known Allergies  Consultations:  Gastroenterology, Zenovia Jarred, MD and Tye Savoy, NP  Infectious Disease, Michel Bickers, MD  General Surgery  Procedures/Studies: Ct Abdomen Pelvis W Contrast 04/22/2016 CLINICAL DATA:  Crohn's disease. EXAM: CT ABDOMEN AND PELVIS WITH CONTRAST TECHNIQUE: Multidetector CT imaging of the abdomen and pelvis was performed using the standard protocol following bolus administration of intravenous contrast. CONTRAST:  1 ISOVUE-300 IOPAMIDOL (ISOVUE-300) INJECTION 61% COMPARISON:  04/05/2016 FINDINGS: Lower chest: The lung bases are clear.  No pleural effusion. Hepatobiliary: No focal liver abnormality is seen. No gallstones, gallbladder wall thickening, or biliary dilatation. Pancreas: Unremarkable. No pancreatic ductal dilatation or surrounding inflammatory changes. Spleen: Normal in size without focal abnormality. Adrenals/Urinary Tract: Adrenal glands are unremarkable. Kidneys are normal, without renal calculi, focal lesion, or hydronephrosis. Bladder is unremarkable. Stomach/Bowel: The stomach appears normal. There is persistent and progressive inflammatory changes involving the right lower quadrant bowel loops including the terminal ileum. Evidence of  persistent and recurrent penetrating disease with fistula formation is identified 2 previously drained abscess cavity, image 64 series 2. There is a recurrent abscess within the right lower quadrant which extends into the ileo psoas muscle. There  is also a enterocutaneous fistula or sinus tract which extends towards the anterior aspect of the right upper thigh, image number 83 of series 2. Right lower quadrant abscess measures 7.2 by 3.0 by 3.9 cm, image 65 of series 2 and image 57 of series 3. Enterocutaneous fistula/ sinus tract extending to the anterior aspect of the upper thigh it measures approximately 11.3 cm, image 45 of series 3. No evidence for bowel obstruction. Vascular/Lymphatic: No significant vascular findings are present. No enlarged abdominal or pelvic lymph nodes. Reproductive: Uterus and bilateral adnexa are unremarkable. Other: No abdominal wall hernia or abnormality. No abdominopelvic ascites. Musculoskeletal: No acute or significant osseous findings. IMPRESSION: 1. Examination is positive for persistent progressive inflammatory changes of Crohn's enteritis involving the right lower quadrant bowel loops. 2. 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 3. No evidence for bowel obstruction. Electronically Signed   By: Kerby Moors M.D.   On: 04/22/2016 21:49   Ct Abdomen Pelvis W Contrast 04/05/2016 CLINICAL DATA:  History of Crohn's colitis, complicated by development of an abscess within the right iliacus musculature, post post percutaneous drainage catheter placement on 03/21/2016. Patient presents today to the interventional radiology drain Clinic for percutaneous drainage catheter evaluation and management. Patient reports approximately 25 cc of fluid output per day from the percutaneous drainage catheter. The patient continues to flush the percutaneous drainage catheter twice a day. The patient continues on intravenous antibiotics. EXAM: CT ABDOMEN AND PELVIS WITH CONTRAST TECHNIQUE: Multidetector CT imaging of the abdomen and pelvis was  performed using the standard protocol following bolus administration of intravenous contrast. CONTRAST:  173m ISOVUE-300 IOPAMIDOL (ISOVUE-300) INJECTION 61% COMPARISON:  CT abdomen pelvis - 03/26/2016; 03/20/2016; CT-guided percutaneous drainage catheter placement - 03/21/2016 FINDINGS: Lower chest: Limited visualization of lower thorax is negative for focal airspace opacity or pleural effusion. Normal heart size.  No pericardial effusion. Hepatobiliary: Normal hepatic contour. No discrete hepatic lesions. Normal appearance of the gallbladder. No radiopaque gallstones. No intra extrahepatic bili duct dilatation. No ascites. Pancreas: Normal appearance of the pancreas. Spleen: Normal appearance of the spleen. Adrenals/Urinary Tract: There is symmetric enhancement of the bilateral kidneys. No renal stones. No urine obstruction or perinephric stranding. Normal appearance of the bilateral adrenal glands. Normal appearance of the urinary bladder given degree distention. Stomach/Bowel: Unchanged positioning of percutaneous drainage catheter with end coiled and locked within the right iliacus musculature. Dominant iliacus musculature abscess has resolved. Additional peripherally enhancing though ill-defined serpiginous fluid collection within the more caudal peripheral aspect of the right iliopsoas musculature has decreased in size the interval, currently measuring 1.3 x 0.8 cm (image 61, series 2), previously, 2.0 x 1.2 cm. Re- demonstrated circumferential wall thickening involving the terminal ileum extending to involve the cecum (best seen on coronal images 38 through 42, series 601), compatible provided history of Crohn's disease. These findings are associated with adjacent mesenteric stranding. Previously questioned tiny interloop fluid collections are not less well demonstrated on the present examination. No new definable/drainable intra- abdominal fluid collections. Secondary inflammation again encompasses the  retrocecal appendix however air is seen throughout the entirety of the appendix to the level of the  appendiceal tip. Large colonic stool burden. Mild patulous distension of several loops of distal small bowel with index loop of small bowel within the midline of the pelvis measuring approximately 4.9 cm, similar to the 03/26/2016 examination and again without definite evidence of enteric obstruction. Vascular/Lymphatic: Normal caliber the abdominal aorta. The major branch vessels of the abdominal aorta appear patent on this non CTA examination. Scattered retroperitoneal lymph nodes are numerous though individually not enlarged by size criteria. No bulky retroperitoneal, mesenteric, pelvic or inguinal lymphadenopathy. Reproductive: Normal appearance of the pelvic organs. There is a small amount of free fluid in the pelvic cul-de-sac. Other: Regional soft tissues appear normal. Musculoskeletal: No acute or aggressive osseous abnormalities. Moderate-sized posteriorly directed disc osteophyte complex at L5, S1, unchanged. IMPRESSION: 1. Resolved dominant component of right iliacus intramuscular abscess following percutaneous drainage catheter placement. Smaller serpiginous ill-defined fluid collection within the peripheral aspect of the right iliopsoas musculature has decreased in size in the interval, currently measuring 1.3 cm in diameter, previously, 2.0 cm. 2. Similar findings of terminal ileitis compatible with provided history of Crohn's disease. Previously questioned interloop fluid collections are less conspicuous on the present examination. 3. Similar findings of mild patulous distension of the distal small bowel without evidence of enteric obstruction. PLAN: Patient will undergo percutaneous drainage catheter injection prior to consideration of percutaneous drainage catheter removal. Electronically Signed   By: Sandi Mariscal M.D.   On: 04/05/2016 14:24   Ct Abdomen Pelvis W Contrast 03/26/2016 CLINICAL DATA:   Status post drain placement 03/21/2016 for right lower quadrant abscess. Crohn colitis. EXAM: CT ABDOMEN AND PELVIS WITH CONTRAST TECHNIQUE: Multidetector CT imaging of the abdomen and pelvis was performed using the standard protocol following bolus administration of intravenous contrast. CONTRAST:  163m ISOVUE-300 IOPAMIDOL (ISOVUE-300) INJECTION 61% COMPARISON:  03/20/2016 FINDINGS: Lower chest: Clear lung bases. Normal heart size without pericardial or pleural effusion. An incompletely image central line. Hepatobiliary: Normal liver. Normal gallbladder, without biliary ductal dilatation. Pancreas: Normal, without mass or ductal dilatation. Spleen: Normal in size, without focal abnormality. Adrenals/Urinary Tract: Normal adrenal glands. Normal kidneys, without hydronephrosis. Normal urinary bladder. Stomach/Bowel: Normal stomach, without wall thickening. Normal colon. Terminal and distal ileal wall thickening is again identified. Felt to be slightly improved. The appendix is dilated and hyperenhancing including on image 56/series 2. This is chronic and favored to be secondary. fluid and gas collection medial to the terminal ileum measures 1.6 cm on image 61/series 2. A more inferior and anterior collection a 2.0 cm on image 67/series 2. These are new. More lateral fluid collections are decreased in size, status post drain placement. Collection along the right psoas muscle is nearly completely resolved, measuring 2.1 cm maximally on image 73/series 2. Compare 5.2 x 3.5 cm on the prior exam (when remeasured). More anterior right-sided collection measures 2.0 cm on image 71/series 2 versus 3.0 cm on the prior exam (when remeasured). More proximal small bowel loops are normal in caliber. Vascular/Lymphatic: Normal caliber of the aorta and branch vessels. No abdominopelvic adenopathy. Reproductive: Normal prostate. Other: No significant free fluid. Musculoskeletal: No acute osseous abnormality. IMPRESSION: 1. Terminal  ileitis, likely mildly improved. Interval decrease in size of lateral right lower quadrant abscesses, status post drain placement. 2. Developing smaller fluid collections medially, suspicious for mesenteric abscesses. Electronically Signed   By: KAbigail MiyamotoM.D.   On: 03/26/2016 12:07   Dg Sinus/fist Tube Chk-non Gi 04/05/2016 CLINICAL DATA:  History of Crohn's disease, post percutaneous drainage catheter placement by Dr. HBarbie Banner  on 03/21/2016. Preceding CT scan of the abdomen and pelvis demonstrates near complete resolution of the right iliacus intramuscular abscess. As such, patient presents now for percutaneous drainage catheter injection. EXAM: ABSCESS INJECTION COMPARISON:  CT scan abdomen pelvis -earlier same day; CT-guided right lower quadrant percutaneous drainage catheter placement - 03/21/2016 CONTRAST:  10 cc Omnipaque 300 FLUOROSCOPY TIME:  30 seconds TECHNIQUE: The patient was positioned supine on the fluoroscopy table. A preprocedural spot fluoroscopic image was obtained of the right lower abdominal quadrant and existing percutaneous drainage catheter Multiple spot fluoroscopic images were obtained in various obliquities following the injection of a small amount of contrast via the existing percutaneous drainage catheter. Images reviewed incision was made to remove the percutaneous drainage catheter. As such, the external portion of the percutaneous drainage catheter was cut and drainage catheter was removed intact. A dressing was placed. The patient tolerated the procedure well without immediate postprocedural complication. FINDINGS: Preprocedural spot fluoroscopic image demonstrates unchanged positioning of the right lower quadrant percutaneous drainage catheter. Contrast injection demonstrates opacification of the residual serpiginous decompressed abscess cavity with reflux of contrast along the catheter tract to the entrance site at the skin surface. There is no definitive communication with the  decompressed abscess cavity and an adjacent loop of bowel. IMPRESSION: 1. No evidence of intestinal fistula. 2. Successful removal of percutaneous drainage catheter. Electronically Signed   By: Sandi Mariscal M.D.   On: 04/05/2016 16:06   Subjective: Patient reports improved redness on anterior thigh with ongoing 7/10 pain which is controlled with analgesics. Had low grade fever (99.60F) overnight.  Discharge Exam: BP 102/61 (BP Location: Left Arm)   Pulse 79   Temp 98.5 F (36.9 C) (Oral)   Resp 16   Ht 6' (1.829 m)   Wt 75.1 kg (165 lb 9.6 oz)   SpO2 100%   BMI 22.46 kg/m   General: Well-developed, well-nourished 36yo male in no distress this afternoon Cardiovascular: RRR, no murmurs. No JVD or LE edema. Respiratory: Nonlabored on room air, clear bilaterally Abdominal: Soft, moderate RLQ tenderness without rebound, non-distended, with normoactive bowel sounds. Skin: Erythematous induration on right superior anterior thigh is less prominent, tender to palpation without fluctuance or drainage.  The results of significant diagnostics from this hospitalization (including imaging, microbiology, ancillary and laboratory) are listed below for reference.    Labs: Basic Metabolic Panel:  Recent Labs Lab 04/22/16 1808 04/23/16 0418 04/24/16 0420  NA 137 139 138  K 3.5 3.8 3.6  CL 106 105 106  CO2 22 27 24   GLUCOSE 96 94 104*  BUN 13 13 9   CREATININE 0.82 1.01 0.79  CALCIUM 8.9 9.2 8.8*   Liver Function Tests:  Recent Labs Lab 04/22/16 1808 04/23/16 0418 04/24/16 0420  AST 14* 12* 11*  ALT 10* 9* 8*  ALKPHOS 46 40 38  BILITOT 0.5 0.6 1.0  PROT 8.3* 7.7 6.9  ALBUMIN 3.3* 3.3* 2.8*   CBC:  Recent Labs Lab 04/22/16 1808 04/23/16 0417 04/24/16 0420  WBC 14.3* 12.7* 12.4*  NEUTROABS 11.8*  --   --   HGB 10.2* 9.6* 8.5*  HCT 31.1* 30.5* 26.7*  MCV 76.8* 79.8 79.9  PLT 463* 405* 350   CBG:  Recent Labs Lab 04/23/16 0738 04/24/16 0734 04/24/16 1406  GLUCAP 94  95 75   Anemia work up No results for input(s): VITAMINB12, FOLATE, FERRITIN, TIBC, IRON, RETICCTPCT in the last 72 hours.  Urinalysis    Component Value Date/Time   COLORURINE AMBER (A) 03/16/2016  [place] 03/16/2016 0219   LABSPEC 1.029 03/16/2016 0219   PHURINE 5.0 03/16/2016 0219   GLUCOSEU NEGATIVE 03/16/2016 0219   HGBUR SMALL (A) 03/16/2016 0219   BILIRUBINUR NEGATIVE 03/16/2016 0219   KETONESUR 5 (A) 03/16/2016 0219   PROTEINUR 30 (A) 03/16/2016 0219   NITRITE NEGATIVE 03/16/2016 [place] 03/16/2016 0219   Microbiology Recent Results (from the past 240 hour(s))  Culture, blood (routine x 2)     Status: None (Preliminary result)   Collection Time: 04/23/16 12:44 AM  Result Value Ref Range Status   Specimen Description BLOOD RIGHT [facility]  Final   Special Requests BOTTLES DRAWN AEROBIC AND ANAEROBIC 10CC  Final   Culture   Final    NO GROWTH 1 DAY Performed at [facility], [place] [address] [place], [place] [address]    Report Status PENDING  Incomplete  Culture, blood (routine x 2)     Status: None (Preliminary result)   Collection Time: 04/23/16 12:44 AM  Result Value Ref Range Status   Specimen Description BLOOD LEFT AC  Final   Special Requests BOTTLES DRAWN AEROBIC AND ANAEROBIC 10CC  Final   Culture   Final    NO GROWTH 1 DAY Performed at [facility], [place] [address] [place], [place] [address]    Report Status PENDING  Incomplete   Time coordinating discharge: 11 minutes  Vance Gather, MD  [facility] 04/24/2016, 4:27 PM Pager [phone]  If 7PM-7AM, please contact night-coverage [email or web address] Password [email or web address]

## 2016-04-24 NOTE — Progress Notes (Signed)
PROGRESS NOTE  Larry [name]  IRS:[id number] DOB: 10-18-1980 DOA: 04/22/2016 PCP: Pcp Not In System  Outpatient Specialists: [facility], Dr. Havery Moros Infectious Disease, Dr. Baxter Flattery General Surgery, Dr. Drue Flirt  Brief Narrative: Larry [name] is a 36 y.o. male with a medical history significant for Crohn's disease and chronic microcytic anemia who presented to the emergency department for evaluation of right lower quadrant and right groin pain with fevers and chills. The patient had been admitted to the hospital from 03/16/2016 until 03/27/2016 and managed for penetrating Crohn's disease with abscess involving the right psoas. He had a drain placed by IR, was treated with antibiotics, and was discharged with PICC to continue antibiotics. GI consultants were hesitant to start biologic therapy prior to resolution of the abscesses and advised that he may need a resection prior to starting treatment. Drain was pulled on 04/05/2016 after repeat imaging demonstrated improvement in the abscesses. He had persistent elevation in WBC and inflammatory markers and his antibiotic course was extended by ID. He was seen by surgery at [facility], but declined resection at this time, hoping to improve with medical therapy and get back to work. Three days ago, the patient noted a tender "knot" in the right groin. He had been experiencing pain in the right pelvis with ambulation for the past month, but this worsened significantly over the past several days. He reports fevers and chills at home.  Upon arrival to the ED, the patient was found to be afebrile, saturating well on room air, tachycardic in the 110s, and with vitals otherwise stable. Chemistry panel was largely unremarkable and CBC was notable for a leukocytosis to 14,300, slightly increased from 2 weeks ago, microcytic anemia with hemoglobin 10.2, down from 11.2 two weeks prior, and a thrombocytosis which has progressed in the past 2 weeks to  463,000. Lactic acid was reassuring at 1.26. CT of the abdomen and pelvis demonstrated persistent progressive inflammatory changes of Crohn's involving the right lower quadrant bowel loops and progressive penetrating disease with recurrent RLQ abscess involving the psoas. 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 He was given IV analgesics in the ED and GI and general surgery were consulted by the ED provider. He was admitted for Crohn's disease with abscess and new enterocutaneous fistula formation despite IV antimicrobial therapy. The plan is for the patient to be transferred to [facility] for evaluation by Dr. Drue Flirt, IBD surgery, as it is expected he will require surgery.   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 Had received caspofungin and ceftriaxone (switched to ertapenem by ID 2/15) through PICC in RUE for abscess involving psoas muscle; drain had been placed 03/21/16, pulled 04/05/16 as abscesses had nearly resolved.  - With interval worsening of disease despite IV antimicrobial therapy, suspect surgical resection will be required. Awaiting transfer to [facility] for IBD surgery, Dr. Drue Flirt, who is familiar with the patient. - In the meantime, IR is evaluating for perc drain as wait time for transfer is unknown. - Pharmacy consulted to start TPN thru PICC. - Holding biologics  with active infection.  - Blood cultures obtained 2/25 - Continuing zosyn  and anidulafungin per ID recommendations.   Microcytic anemia: Hgb is 10.2 on admission, down from 11.2 two weeks ago. MCV has decreased to 76.8 - Monitor daily, no indication for transfusion. - Check anemia panel with AM labs.   Leukocytosis, thrombocytosis: WBC elevated to 14,300 and platelets elevated to 463,000 on admission; both indices increased slightly from 2 wks prior. Likely secondary to acute infectious/inflammatory process, which is being addressed as above  - Monitoring daily  DVT prophylaxis: SCDs, pt refuses prophylactic anticoagulation. Code Status: Full Family Communication: None at bedside this AM Disposition Plan: Transfer to [facility] for surgical specialty services once bed available (likely to require complex operative management by IBD surgery)  Consultants:   [facility], Dr. Hilarie Fredrickson  Infectious Disease, Dr. Megan Salon  General Surgery  Procedures:   PICC double lumen in RUE (placed 03/17/2016)  Antimicrobials:  Zosyn 2/25 >>  Anidulafungin 2/25 >>  Subjective: Patient reports improved redness on anterior thigh with ongoing 7/10 pain which is controlled with analgesics. Had low grade fever (99.61F) overnight.    Objective: Vitals:   04/23/16 0500 04/23/16 1411 04/23/16 2122 04/24/16 0512  BP:  (!) 106/53 (!) 100/56 (!) 95/52  Pulse:  96 85 85  Resp:  16 16 18   Temp:  98.7 F (37.1 C) 99.8 F (37.7 C) 99.8 F (37.7 C)  TempSrc:  Oral Oral Oral  SpO2:  99% 98% 98%  Weight: 75.1 kg (165 lb 9.6 oz)     Height:        Intake/Output Summary (Last 24 hours) at 04/24/16 1310 Last data filed at 04/24/16 1103  Gross per 24 hour  Intake          2415.84 ml  Output              800 ml  Net          1615.84 ml   Filed Weights   04/22/16 1657 04/23/16 0500  Weight: 73.9 kg (163 lb) 75.1 kg (165 lb 9.6 oz)    Examination: General exam: Well-developed, well-nourished 36yo male in mild distress. Respiratory system: Non-labored breathing room air. Clear  to auscultation bilaterally.  Cardiovascular system: Regular rate and rhythm. No murmur, rub, or gallop. No JVD, and no pedal edema. Gastrointestinal system: Abdomen soft with moderate RLQ tenderness without rebound, non-distended, with normoactive bowel sounds. No organomegaly or masses felt. Central nervous system: Alert and oriented. No focal neurological deficits. Extremities: Warm, no deformities Skin: Erythematous induration on right superior anterior thigh is less prominent, tender to palpation without fluctuance or drainage. Psychiatry: Judgement and insight appear normal. Mood & affect appropriate.   Data Reviewed: I have personally reviewed following labs and imaging studies  CBC:  Recent Labs Lab 04/22/16 1808 04/23/16 0417 04/24/16 0420  WBC 14.3* 12.7* 12.4*  NEUTROABS 11.8*  --   --   HGB 10.2* 9.6* 8.5*  HCT 31.1* 30.5* 26.7*  MCV 76.8* 79.8 79.9  PLT 463* 405* 032   Basic Metabolic Panel:  Recent Labs Lab 04/22/16 1808 04/23/16 0418 04/24/16 0420  NA 137 139 138  K 3.5 3.8 3.6  CL 106 105 106  CO2 22 27 24   GLUCOSE 96 94 104*  BUN 13 13 9   CREATININE 0.82 1.01 0.79  CALCIUM 8.9 9.2 8.8*   GFR: Estimated Creatinine Clearance: 139.5 mL/min (by C-G formula based on SCr of 0.79 mg/dL). Liver Function Tests:  Recent Labs Lab  04/22/16 1808 04/23/16 0418 04/24/16 0420  AST 14* 12* 11*  ALT 10* 9* 8*  ALKPHOS 46 40 38  BILITOT 0.5 0.6 1.0  PROT 8.3* 7.7 6.9  ALBUMIN 3.3* 3.3* 2.8*   No results for input(s): LIPASE, AMYLASE in the last 168 hours. No results for input(s): AMMONIA in the last 168 hours. Coagulation Profile:  Recent Labs Lab 04/24/16 1101  INR 1.40   Cardiac Enzymes: No results for input(s): CKTOTAL, CKMB, CKMBINDEX, TROPONINI in the last 168 hours. BNP (last 3 results) No results for input(s): PROBNP in the last 8760 hours. HbA1C: No results for input(s): HGBA1C in the last 72 hours. CBG:  Recent Labs Lab 04/23/16 0738  04/24/16 0734  GLUCAP 94 95   Lipid Profile: No results for input(s): CHOL, HDL, LDLCALC, TRIG, CHOLHDL, LDLDIRECT in the last 72 hours. Thyroid Function Tests: No results for input(s): TSH, T4TOTAL, FREET4, T3FREE, THYROIDAB in the last 72 hours. Anemia Panel: No results for input(s): VITAMINB12, FOLATE, FERRITIN, TIBC, IRON, RETICCTPCT in the last 72 hours. Urine analysis:    Component Value Date/Time   COLORURINE AMBER (A) 03/16/2016 0219   APPEARANCEUR CLEAR 03/16/2016 0219   LABSPEC 1.029 03/16/2016 0219   PHURINE 5.0 03/16/2016 0219   GLUCOSEU NEGATIVE 03/16/2016 0219   HGBUR SMALL (A) 03/16/2016 0219   BILIRUBINUR NEGATIVE 03/16/2016 0219   KETONESUR 5 (A) 03/16/2016 0219   PROTEINUR 30 (A) 03/16/2016 0219   NITRITE NEGATIVE 03/16/2016 0219   LEUKOCYTESUR NEGATIVE 03/16/2016 0219   Recent Results (from the past 240 hour(s))  Culture, blood (routine x 2)     Status: None (Preliminary result)   Collection Time: 04/23/16 12:44 AM  Result Value Ref Range Status   Specimen Description BLOOD RIGHT [facility]  Final   Special Requests BOTTLES DRAWN AEROBIC AND ANAEROBIC 10CC  Final   Culture   Final    NO GROWTH 1 DAY Performed at [facility], [place] [address] [place], [place] [address]    Report Status PENDING  Incomplete  Culture, blood (routine x 2)     Status: None (Preliminary result)   Collection Time: 04/23/16 12:44 AM  Result Value Ref Range Status   Specimen Description BLOOD LEFT AC  Final   Special Requests BOTTLES DRAWN AEROBIC AND ANAEROBIC 10CC  Final   Culture   Final    NO GROWTH 1 DAY Performed at [facility], [place] [address], [place], [place] [address]    Report Status PENDING  Incomplete      Radiology Studies: Ct Abdomen Pelvis W Contrast  Result Date: 04/22/2016 CLINICAL DATA:  Crohn's disease. EXAM: CT ABDOMEN AND PELVIS WITH CONTRAST TECHNIQUE: Multidetector CT imaging of the abdomen and pelvis was performed using the standard protocol  following bolus administration of intravenous contrast. CONTRAST:  1 ISOVUE-300 IOPAMIDOL (ISOVUE-300) INJECTION 61% COMPARISON:  04/05/2016 FINDINGS: Lower chest: The lung bases are clear.  No pleural effusion. Hepatobiliary: No focal liver abnormality is seen. No gallstones, gallbladder wall thickening, or biliary dilatation. Pancreas: Unremarkable. No pancreatic ductal dilatation or surrounding inflammatory changes. Spleen: Normal in size without focal abnormality. Adrenals/Urinary Tract: Adrenal glands are unremarkable. Kidneys are normal, without renal calculi, focal lesion, or hydronephrosis. Bladder is unremarkable. Stomach/Bowel: The stomach appears normal. There is persistent and progressive inflammatory changes involving the right lower quadrant bowel loops including the terminal ileum. Evidence of persistent and recurrent penetrating disease with fistula formation is identified 2 previously drained abscess cavity, image 64 series 2. There is  a recurrent abscess within the right lower quadrant which extends into the ileo psoas muscle. 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 Right lower quadrant abscess measures 7.2 by 3.0 by 3.9 cm, image 65 of series 2 and image 57 of series 3. Enterocutaneous fistula/ sinus tract extending to the anterior aspect of the upper thigh it measures approximately 11.3 cm, image 45 of series 3. No evidence for bowel obstruction. Vascular/Lymphatic: No significant vascular findings are present. No enlarged abdominal or pelvic lymph nodes. Reproductive: Uterus and bilateral adnexa are unremarkable. Other: No abdominal wall hernia or abnormality. No abdominopelvic ascites. Musculoskeletal: No acute or significant osseous findings. IMPRESSION: 1. Examination is positive for persistent progressive inflammatory changes of Crohn's enteritis involving the right lower quadrant  bowel loops. 2. 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 3. No evidence for bowel obstruction. Electronically Signed   By: Kerby Moors M.D.   On: 04/22/2016 21:49    Scheduled Meds: . anidulafungin  100 mg Intravenous Q24H  . insulin aspart  0-9 Units Subcutaneous Q6H  . piperacillin-tazobactam (ZOSYN)  IV  3.375 g Intravenous Q8H   Continuous Infusions: . dextrose 5 % and 0.9% NaCl    . Marland KitchenTPN (CLINIMIX-E) Adult     And  . fat emulsion       LOS: 2 days   Time spent: 25 minutes.  Vance Gather, MD [facility] Pager [phone]  If 7PM-7AM, please contact night-coverage [email or web address] Password [email or web address] 04/24/2016, 1:10 PM

## 2016-04-24 NOTE — Progress Notes (Signed)
[facility] Progress Note  Chief Complaint:   Crohn's disease  Subjective: feels about the same as yesterday. Requiring frequent pain medication.   Objective:  Vital signs in last 24 hours: Temp:  [98.7 F (37.1 C)-99.8 F (37.7 C)] 99.8 F (37.7 C) (02/27 0512) Pulse Rate:  [85-96] 85 (02/27 0512) Resp:  [16-18] 18 (02/27 0512) BP: (95-106)/(52-56) 95/52 (02/27 0512) SpO2:  [98 %-99 %] 98 % (02/27 0512) Last BM Date: 04/21/16 General:   Pleasanat, alert, thin black male in NAD EENT:  Normal hearing, non icteric sclera, conjunctive pink.  Heart:  Regular rate and rhythm; no murmurs. no lower extremity edema Pulm: Normal respiratory effort Abdomen:  Soft, nondistended, moderate RLQ tenderness. The cord-like mass in RLQ present yesterday has signficantly decreased. A few  bowel sounds, Neurologic:  Alert and  oriented x4;  grossly normal neurologically. Psych:  Alert and cooperative. Normal mood and affect.   Intake/Output from previous day: 02/26 0701 - 02/27 0700 In: 2990 [I.V.:2640; IV Piggyback:350] Out: 850 [Urine:850] Intake/Output this shift: Total I/O In: 50 [IV Piggyback:50] Out: -   Lab Results:  Recent Labs  04/22/16 1808 04/23/16 0417 04/24/16 0420  WBC 14.3* 12.7* 12.4*  HGB 10.2* 9.6* 8.5*  HCT 31.1* 30.5* 26.7*  PLT 463* 405* 350   BMET  Recent Labs  04/22/16 1808 04/23/16 0418 04/24/16 0420  NA 137 139 138  K 3.5 3.8 3.6  CL 106 105 106  CO2 22 27 24   GLUCOSE 96 94 104*  BUN 13 13 9   CREATININE 0.82 1.01 0.79  CALCIUM 8.9 9.2 8.8*   LFT  Recent Labs  04/24/16 0420  PROT 6.9  ALBUMIN 2.8*  AST 11*  ALT 8*  ALKPHOS 38  BILITOT 1.0    Ct Abdomen Pelvis W Contrast  Result Date: 04/22/2016 CLINICAL DATA:  Crohn's disease. EXAM: CT ABDOMEN AND PELVIS WITH CONTRAST TECHNIQUE: Multidetector CT imaging of the abdomen and pelvis was performed using the standard protocol following bolus administration of  intravenous contrast. CONTRAST:  1 ISOVUE-300 IOPAMIDOL (ISOVUE-300) INJECTION 61% COMPARISON:  04/05/2016 FINDINGS: Lower chest: The lung bases are clear.  No pleural effusion. Hepatobiliary: No focal liver abnormality is seen. No gallstones, gallbladder wall thickening, or biliary dilatation. Pancreas: Unremarkable. No pancreatic ductal dilatation or surrounding inflammatory changes. Spleen: Normal in size without focal abnormality. Adrenals/Urinary Tract: Adrenal glands are unremarkable. Kidneys are normal, without renal calculi, focal lesion, or hydronephrosis. Bladder is unremarkable. Stomach/Bowel: The stomach appears normal. There is persistent and progressive inflammatory changes involving the right lower quadrant bowel loops including the terminal ileum. Evidence of persistent and recurrent penetrating disease with fistula formation is identified 2 previously drained abscess cavity, image 64 series 2. There is a recurrent abscess within the right lower quadrant which extends into the ileo psoas muscle. 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 Right lower quadrant abscess measures 7.2 by 3.0 by 3.9 cm, image 65 of series 2 and image 57 of series 3. Enterocutaneous fistula/ sinus tract extending to the anterior aspect of the upper thigh it measures approximately 11.3 cm, image 45 of series 3. No evidence for bowel obstruction. Vascular/Lymphatic: No significant vascular findings are present. No enlarged abdominal or pelvic lymph nodes. Reproductive: Uterus and bilateral adnexa are unremarkable. Other: No abdominal wall hernia or abnormality. No abdominopelvic ascites. Musculoskeletal: No acute or significant osseous findings. IMPRESSION:  1. Examination is positive for persistent progressive inflammatory changes of Crohn's enteritis involving the right lower quadrant bowel loops. 2. Progressive  changes of penetrating disease identified with recurrent right lower quadrant abscess involving the psoas muscle and interval development of enterocutaneous fistula/sinus tract to the anterior aspect of the right upper thigh. 3. No evidence for bowel obstruction. Electronically Signed   By: Kerby Moors M.D.   On: 04/22/2016 21:49    Assessment / Plan:  Fistulizing Crohn's complicated by intra-abdominal and iliopsoas muscle abscesses. WBC still in 12 range, tmax 99.8   Awaiting call back from Colorectal Surgery at [facility], Dr.Ashburn, is familiar with case. Hopefully we can get patient transferred as he will most likely come to surgery. Currently there are no meds available at the facility.  -While awaiting transfer will ask IR to evaluate for replacement of perc drain -Patient will likely be on bowel rest for extended amount of time. Will start TNA, he has a PICC in place.   -continue supportive care, updated him on bed situation at [facility] -continue IV abx, anti-fungal, analgesics -continue DVT prophylaxis, will ambulate today.   Principal Problem:   Crohn's disease of ileum with abscess ([place]) Active Problems:   Microcytic anemia   Crohn's colitis, unspecified complication ([place])   Crohn's colitis, with fistula ([place])   Thrombocytosis ([place])    LOS: 2 days   Tye Savoy  NP  04/24/2016, 9:38 AM  Pager number [phone]

## 2016-04-24 NOTE — Progress Notes (Signed)
Patient ID: Larry [name] Larry [name] [name], male   DOB: 1980-09-01, 36 y.o.   MRN: [id number]          [facility]  Date of Admission:  04/22/2016   Total days of antibiotics 40        Day 2 piperacillin tazobactam        Day 2 anidulafungin         Principal Problem:   Crohn's disease of ileum with abscess ([place]) Active Problems:   Microcytic anemia   Crohn's colitis, with fistula ([place])   Thrombocytosis ([place])   . anidulafungin  100 mg Intravenous Q24H  . insulin aspart  0-9 Units Subcutaneous Q6H  . piperacillin-tazobactam (ZOSYN)  IV  3.375 g Intravenous Q8H    SUBJECTIVE: His right groin pain is unchanged.  Review of Systems: Review of Systems  Constitutional: Negative for chills, diaphoresis and fever.  Gastrointestinal: Positive for abdominal pain. Negative for nausea and vomiting.    Past Medical History:  Diagnosis Date  . Crohn's disease [facility])     Social History  Substance Use Topics  . Smoking status: Never Smoker  . Smokeless tobacco: Never Used  . Alcohol use No    History reviewed. No pertinent family history. No Known Allergies  OBJECTIVE: Vitals:   04/23/16 1411 04/23/16 2122 04/24/16 0512 04/24/16 1427  BP: (!) 106/53 (!) 100/56 (!) 95/52 102/61  Pulse: 96 85 85 79  Resp: 16 16 18 16   Temp: 98.7 F (37.1 C) 99.8 F (37.7 C) 99.8 F (37.7 C) 98.5 F (36.9 C)  TempSrc: Oral Oral Oral Oral  SpO2: 99% 98% 98% 100%  Weight:      Height:       Body mass index is 22.46 kg/m.  Physical Exam  Abdominal:  The swelling and induration in his right groin is now more diffuse. There is overlying erythema. Still quite tender to palpation.    Lab Results Lab Results  Component Value Date   WBC 12.4 (H) 04/24/2016   HGB 8.5 (L) 04/24/2016   HCT 26.7 (L) 04/24/2016   MCV 79.9 04/24/2016   PLT 350 04/24/2016    Lab Results  Component Value Date   CREATININE 0.79 04/24/2016   BUN 9 04/24/2016   NA 138 04/24/2016   K 3.6  04/24/2016   CL 106 04/24/2016   CO2 24 04/24/2016    Lab Results  Component Value Date   ALT 8 (L) 04/24/2016   AST 11 (L) 04/24/2016   ALKPHOS 38 04/24/2016   BILITOT 1.0 04/24/2016     Microbiology: Recent Results (from the past 240 hour(s))  Culture, blood (routine x 2)     Status: None (Preliminary result)   Collection Time: 04/23/16 12:44 AM  Result Value Ref Range Status   Specimen Description BLOOD RIGHT [facility]  Final   Special Requests BOTTLES DRAWN AEROBIC AND ANAEROBIC 10CC  Final   Culture   Final    NO GROWTH 1 DAY Performed at [facility], [place] [address] [place],  [address]    Report Status PENDING  Incomplete  Culture, blood (routine x 2)     Status: None (Preliminary result)   Collection Time: 04/23/16 12:44 AM  Result Value Ref Range Status   Specimen Description BLOOD LEFT AC  Final   Special Requests BOTTLES DRAWN AEROBIC AND ANAEROBIC 10CC  Final   Culture   Final    NO GROWTH 1 DAY Performed at [facility], [address] [place], [place] [address]    Report Status PENDING  Incomplete   ASSESSMENT: He has an enlarging abscess complicating his Crohn's colitis. He is awaiting transfer to [facility].  PLAN: 1. Continue current antimicrobial therapy for now  Michel Bickers, MD [facility] for [place] [phone] pager   [phone] cell 04/24/2016, 4:21 PM

## 2016-04-25 ENCOUNTER — Telehealth: Payer: Self-pay

## 2016-04-25 NOTE — Telephone Encounter (Signed)
Left message for patient to call back, we have received the approval from his insurance to start the Humira. Approval is valid from 04/24/16-04/24/17.

## 2016-04-25 NOTE — Progress Notes (Signed)
Patient transferred via [place] to [facility] 9th floor of the [place], bed 929. Hinton Dyer, the receiving nurse contact took report.  Her number is  [phone], or charge phone [phone].

## 2016-04-27 ENCOUNTER — Telehealth: Payer: Self-pay

## 2016-04-27 ENCOUNTER — Telehealth: Payer: Self-pay | Admitting: Gastroenterology

## 2016-04-27 NOTE — Telephone Encounter (Signed)
Called patient to let him know that the Humira has been approved. He was just released from [facility], had to have a drain placed. He has a follow up next week there, he said that he was told that he will need to be scheduled for surgery. I asked him to call and let us know what/when the surgeon plans to do something.

## 2016-04-27 NOTE — Telephone Encounter (Signed)
Okay thanks for the update. We should hold off on his Humira until his surgery has been done, and will start it post-operatively. Thanks

## 2016-04-28 LAB — CULTURE, BLOOD (ROUTINE X 2)
Culture: NO GROWTH
Culture: NO GROWTH

## 2016-04-30 ENCOUNTER — Telehealth: Payer: Self-pay

## 2016-04-30 NOTE — Telephone Encounter (Signed)
Left message for patient that putting the Humira on hold until after his surgery. Asked that he keep [place] updated on his progress, call if he has questions or concerns.

## 2016-05-02 MED FILL — Hydromorphone HCl Inj 2 MG/ML: INTRAMUSCULAR | Qty: 1 | Status: AC

## 2016-05-09 ENCOUNTER — Telehealth: Payer: Self-pay | Admitting: Gastroenterology

## 2016-05-09 NOTE — Telephone Encounter (Signed)
Rx on hold for now until after patient has had surgery.

## 2016-05-10 NOTE — Telephone Encounter (Signed)
Clarification of Humira script sent to [facility].

## 2016-05-11 ENCOUNTER — Telehealth: Payer: Self-pay | Admitting: Gastroenterology

## 2016-05-11 NOTE — Telephone Encounter (Signed)
I have spoke twice to pharmacy to let them know Rx for Humira is on hold, until after patient has surgery.

## 2016-05-14 HISTORY — PX: LAPAROSCOPIC ILEOCECECTOMY: SHX5898

## 2016-05-17 ENCOUNTER — Telehealth: Payer: Self-pay

## 2016-05-17 NOTE — Telephone Encounter (Signed)
Patient had surgery on 05/14/16, doing okay. Understands to let us know when he's been cleared surgically to start Humira.

## 2016-05-21 ENCOUNTER — Encounter: Payer: Self-pay | Admitting: Anesthesiology

## 2016-06-06 NOTE — Telephone Encounter (Signed)
Patient calling today to state he has been cleared by the surgeon to go ahead and start humira.

## 2016-06-06 NOTE — Telephone Encounter (Signed)
Thanks Almyra Free. Yes I would like him to start as soon as possible and see him in the clinic in upcoming weeks for reassessment. Thanks

## 2016-06-06 NOTE — Telephone Encounter (Signed)
I am assuming you want him to start as soon as possible on his humira.

## 2016-06-07 NOTE — Telephone Encounter (Signed)
Spoke to patient, he is doing very well. He has been cleared to start the Humira. I gave him the number for the [facility], as he had misplaced it. Also [facility] phone number. I will fax in order to this pharmacy today. Patient has a follow up appointment with [place] on 4/23.

## 2016-06-18 ENCOUNTER — Other Ambulatory Visit (INDEPENDENT_AMBULATORY_CARE_PROVIDER_SITE_OTHER): Payer: BLUE CROSS/BLUE SHIELD

## 2016-06-18 ENCOUNTER — Other Ambulatory Visit: Payer: Self-pay

## 2016-06-18 ENCOUNTER — Encounter: Payer: Self-pay | Admitting: Gastroenterology

## 2016-06-18 ENCOUNTER — Encounter (INDEPENDENT_AMBULATORY_CARE_PROVIDER_SITE_OTHER): Payer: Self-pay

## 2016-06-18 ENCOUNTER — Ambulatory Visit (INDEPENDENT_AMBULATORY_CARE_PROVIDER_SITE_OTHER): Payer: BLUE CROSS/BLUE SHIELD | Admitting: Gastroenterology

## 2016-06-18 VITALS — BP 106/60 | HR 84 | Ht 72.0 in | Wt 175.5 lb

## 2016-06-18 DIAGNOSIS — K50813 Crohn's disease of both small and large intestine with fistula: Secondary | ICD-10-CM

## 2016-06-18 DIAGNOSIS — Z79899 Other long term (current) drug therapy: Secondary | ICD-10-CM | POA: Diagnosis not present

## 2016-06-18 DIAGNOSIS — K50119 Crohn's disease of large intestine with unspecified complications: Secondary | ICD-10-CM

## 2016-06-18 LAB — COMPREHENSIVE METABOLIC PANEL
ALK PHOS: 56 U/L (ref 39–117)
ALT: 43 U/L (ref 0–53)
AST: 25 U/L (ref 0–37)
Albumin: 4.3 g/dL (ref 3.5–5.2)
BILIRUBIN TOTAL: 0.3 mg/dL (ref 0.2–1.2)
BUN: 13 mg/dL (ref 6–23)
CO2: 29 mEq/L (ref 19–32)
Calcium: 9.8 mg/dL (ref 8.4–10.5)
Chloride: 107 mEq/L (ref 96–112)
Creatinine, Ser: 0.87 mg/dL (ref 0.40–1.50)
GFR: 129.28 mL/min (ref >60.00)
GLUCOSE: 93 mg/dL (ref 70–99)
Potassium: 4.5 mEq/L (ref 3.5–5.1)
SODIUM: 142 meq/L (ref 135–145)
TOTAL PROTEIN: 8 g/dL (ref 6.0–8.3)

## 2016-06-18 LAB — CBC
HCT: 38.4 % — ABNORMAL LOW (ref 39.0–52.0)
Hemoglobin: 12.5 g/dL — ABNORMAL LOW (ref 13.0–17.0)
MCHC: 32.4 g/dL (ref 30.0–36.0)
MCV: 83.1 fl (ref 78.0–100.0)
Platelets: 305 10*3/uL (ref 150.0–400.0)
RBC: 4.62 Mil/uL (ref 4.22–5.81)
RDW: 18.4 % — AB (ref 11.5–15.5)
WBC: 7.1 10*3/uL (ref 4.0–10.5)

## 2016-06-18 MED ORDER — FOLIC ACID 1 MG PO TABS: 1.0000 mg | ORAL_TABLET | Freq: Every day | ORAL | 3 refills | Status: DC

## 2016-06-18 MED ORDER — METHOTREXATE 2.5 MG PO TABS: ORAL_TABLET | ORAL | 1 refills | Status: DC

## 2016-06-18 NOTE — Patient Instructions (Signed)
If you are age 34 or older, your body mass index should be between 23-30. Your Body mass index is 23.8 kg/m. If this is out of the aforementioned range listed, please consider follow up with your Primary Care Provider.  If you are age 1 or younger, your body mass index should be between 19-25. Your Body mass index is 23.8 kg/m. If this is out of the aformentioned range listed, please consider follow up with your Primary Care Provider.   We have sent the following medications to your pharmacy for you to pick up at your convenience:  Folic Acid  Methotrexate  Your physician has requested that you go to the basement for the following lab work before leaving today:  CBC, CMET  Follow up in 3 months with Dr. Havery Moros  Thank you.

## 2016-06-18 NOTE — Progress Notes (Signed)
HPI :  36 y/o male with ileocolonic Crohn's disease diagnosed in 2015. He presented initially with spontaneous abscess which required drainage. He was followed in [place], [place] and maintained on Imuran 115m daily for 3 years or so, and [facility] with multiple retroperitoneal abscess in Jan 2019. He was treated with bowel rest and TPN and antibiotics, IR was able to drain one of the abscesses at the time. [facility] after his initial hospitalization, and he declined surgery at the time as he was feeling better. Unfortunately he failed conservative managemenet and had recurrence of abscess formation, [facility].   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 He has recovered really well since that time and is feeling much better.  He has regained > 20 lbs since the beginning of this month, went from 151 to 172 lbs.  He has not had any abdominal pain at all for the past 3 weeks.  He reports good bowel function - he has roughly 2 BMs per day. No blood in the stools.  Eating well, no vomiting. He has no complaints  No tobacco use. No FH of Crohn's.   We had previously discussed options for medical therapy for his Crohn's and he wanted to start Humira. He had the Humira delivered on Friday, to receive teaching to have his first injection in the next day or so. No NSAIDs. Off all medications.   IBD Health Care Maintenance: Annual Flu Vaccine - UTD Pneumococcal Vaccine : - Date patient thinks he received it when hospitalized for surgery TB testing if on anti-TNF, yearly - skin test negative 04/12/16 Vitamin D screening - Date Last Colonoscopy - 2015    Past Medical History:    Diagnosis Date  . Crohn's disease ([facility]      Past Surgical History:  Procedure Laterality Date  . bowel abscess from crohns     . IR GENERIC HISTORICAL  04/05/2016   IR RADIOLOGIST EVAL & MGMT 04/05/2016 JSandi Mariscal MD [facility] INTERV RAD  . LAPAROSCOPIC ILEOCECECTOMY  05/14/2016   with debridement of chronic abscess cavity asnd takedown of fistulas   History reviewed. No pertinent family history. Social History  Substance Use Topics  . Smoking status: Never Smoker  . Smokeless tobacco: Never Used  . Alcohol use No   Current Outpatient Prescriptions  Medication Sig Dispense Refill  . folic acid (FOLVITE) 1 MG tablet Take 1 tablet (1 mg total) by mouth daily. 90 tablet 3  . methotrexate (RHEUMATREX) 2.5 MG tablet Caution:Chemotherapy. Protect from light.  Take 6 tablets once weekly 72 tablet 1   No current facility-administered medications for this visit.    No Known Allergies   Review of Systems: All systems reviewed and negative except where noted in HPI.   Lab Results  Component Value Date   WBC 12.4 (H) 04/24/2016   HGB 8.5 (L) 04/24/2016   HCT 26.7 (L) 04/24/2016   MCV 79.9 04/24/2016   PLT 350 04/24/2016    Lab Results  Component Value Date   CREATININE 0.79 04/24/2016   BUN 9 04/24/2016   NA 138 04/24/2016   K 3.6 04/24/2016   CL 106 04/24/2016   CO2 24 04/24/2016    Lab Results  Component Value Date   ALT 8 (L) 04/24/2016   AST 11 (L) 04/24/2016   ALKPHOS 38 04/24/2016   BILITOT 1.0 04/24/2016     Physical Exam: BP 106/60   Pulse 84   Ht 6' (1.829 m)   Wt 175 lb 8 oz (79.6 kg)   BMI 23.80 kg/m  Constitutional: Pleasant,well-developed, male in no acute distress. HEENT: Normocephalic and atraumatic. Conjunctivae are normal. No scleral icterus. Neck supple.  Cardiovascular: Normal rate, regular rhythm.  Pulmonary/chest: Effort normal and breath sounds normal. No wheezing, rales or rhonchi. Abdominal: Soft, nondistended, nontender. Surgical  scars c/d, right inguinal incision still healing, no purulence or drainage.  There are no masses palpable. No hepatomegaly. Extremities: no edema Lymphadenopathy: No cervical adenopathy noted. Neurological: Alert and oriented to person place and time. Skin: Skin is warm and dry. No rashes noted. Psychiatric: Normal mood and affect. Behavior is normal.   ASSESSMENT AND PLAN: 36 year old male with perforating ileocolonic Crohn's disease, status post multiple hospitalizations for recurrent abscesses while on azathioprine monotherapy. He is now status post ileocecectomy with fistula takedown and doing much better.   We discussed Crohn's disease in general, natural history, management options, and risks and benefits of medical therapies. I consider him to have an aggressive phenotype of Crohn's disease given his recent course, and would recommend combination immunosuppressive therapy at this time. He's been approved for and about to start Humira this week. We discussed risk and benefits of this regimen he wanted to proceed. I'm recommending we also add either methotrexate or a thiopurine to his regimen to help prevent immunogenicity and prevent recurrence of his disease. We discussed risks and benefits of both methotrexate and thiopurines. He elected to start methotrexate due to increased risk of lymphoma in young males with thiopurine use. He will start Humira this week, and plan on starting methotrexate in the next 2-3 weeks (dosed at 58m PO q week). Due for CBC and LFTs prior to starting therapy today, and repeat 2 weeks after starting methotrexate. He will take folic acid 1 mg daily along with his methotrexate. I counseled him to avoid alcohol use swallow methotrexate as well as the need to stop this prior to attempts at conceiving a child, and he agreed. Otherwise [facility] at this time or not.   He agreed with the plan, all questions  answered, I will see him again in the office in 3 months or sooner with any questions or concerns.   SCarolina Cellar MD [facility] Pager [phone]

## 2016-06-19 ENCOUNTER — Telehealth: Payer: Self-pay

## 2016-06-19 NOTE — Telephone Encounter (Signed)
-----   Message from Manus Gunning, MD sent at 06/18/2016  5:02 PM EDT ----- He should take folic acid just when taking methotrexate, thanks for the clarification   ----- Message ----- From: Doristine Counter, RN Sent: 06/18/2016   3:38 PM To: Manus Gunning, MD  Patient asked about when to start the folic acid you prescribed today, looking at your note it looks like you want him to take that while on the methotrexate, so I advised him to start them together. If you want him on the folic acid now, please let me know. Thank you, Almyra Free

## 2016-06-19 NOTE — Telephone Encounter (Signed)
Left message for patient to start folic acid at the same time as methotrexate.

## 2016-09-10 ENCOUNTER — Encounter: Payer: Self-pay | Admitting: Gastroenterology

## 2016-11-08 ENCOUNTER — Other Ambulatory Visit: Payer: Self-pay

## 2016-11-08 ENCOUNTER — Telehealth: Payer: Self-pay

## 2016-11-08 NOTE — Telephone Encounter (Signed)
Left message for pt to call me.  He has an appt with Dr. Havery Moros on 11-13-16.  At his last OV on 06-18-16 Dr. Havery Moros had requested that he get repeat labs in 2 weeks and I don't see that he did that. Wanted to see if he could come in to do labs prior to appt on 11-13-16.  Lab orders are in place.

## 2016-11-08 NOTE — Telephone Encounter (Signed)
Spoke to patient.  He lives about an hour away, and with the bad weather we are supposed to have over the next few days we agreed that we will address the needed repeat labs when he comes in for his appt next week, pending Dr. Doyne Keel wishes.

## 2016-11-13 ENCOUNTER — Ambulatory Visit: Payer: BLUE CROSS/BLUE SHIELD | Admitting: Gastroenterology

## 2016-12-04 ENCOUNTER — Ambulatory Visit (INDEPENDENT_AMBULATORY_CARE_PROVIDER_SITE_OTHER): Payer: BLUE CROSS/BLUE SHIELD | Admitting: Gastroenterology

## 2016-12-04 ENCOUNTER — Encounter (INDEPENDENT_AMBULATORY_CARE_PROVIDER_SITE_OTHER): Payer: Self-pay

## 2016-12-04 ENCOUNTER — Other Ambulatory Visit (INDEPENDENT_AMBULATORY_CARE_PROVIDER_SITE_OTHER): Payer: BLUE CROSS/BLUE SHIELD

## 2016-12-04 ENCOUNTER — Encounter: Payer: Self-pay | Admitting: Gastroenterology

## 2016-12-04 VITALS — BP 112/80 | HR 72 | Ht 72.0 in | Wt 215.4 lb

## 2016-12-04 DIAGNOSIS — Z79899 Other long term (current) drug therapy: Secondary | ICD-10-CM | POA: Diagnosis not present

## 2016-12-04 DIAGNOSIS — K50019 Crohn's disease of small intestine with unspecified complications: Secondary | ICD-10-CM

## 2016-12-04 LAB — CBC WITH DIFFERENTIAL/PLATELET
Basophils Absolute: 0.1 10*3/uL (ref 0.0–0.1)
Basophils Relative: 0.9 % (ref 0.0–3.0)
Eosinophils Absolute: 0.2 10*3/uL (ref 0.0–0.7)
Eosinophils Relative: 2.6 % (ref 0.0–5.0)
HEMATOCRIT: 43.5 % (ref 39.0–52.0)
Hemoglobin: 14.7 g/dL (ref 13.0–17.0)
LYMPHS PCT: 27.2 % (ref 12.0–46.0)
Lymphs Abs: 1.8 10*3/uL (ref 0.7–4.0)
MCHC: 33.8 g/dL (ref 30.0–36.0)
MCV: 89.3 fl (ref 78.0–100.0)
MONOS PCT: 10.7 % (ref 3.0–12.0)
Monocytes Absolute: 0.7 10*3/uL (ref 0.1–1.0)
NEUTROS ABS: 3.9 10*3/uL (ref 1.4–7.7)
Neutrophils Relative %: 58.6 % (ref 43.0–77.0)
PLATELETS: 262 10*3/uL (ref 150.0–400.0)
RBC: 4.87 Mil/uL (ref 4.22–5.81)
RDW: 13.8 % (ref 11.5–15.5)
WBC: 6.6 10*3/uL (ref 4.0–10.5)

## 2016-12-04 LAB — COMPREHENSIVE METABOLIC PANEL
ALT: 22 U/L (ref 0–53)
AST: 21 U/L (ref 0–37)
Albumin: 4.4 g/dL (ref 3.5–5.2)
Alkaline Phosphatase: 52 U/L (ref 39–117)
BUN: 12 mg/dL (ref 6–23)
CALCIUM: 9.4 mg/dL (ref 8.4–10.5)
CHLORIDE: 105 meq/L (ref 96–112)
CO2: 28 meq/L (ref 19–32)
Creatinine, Ser: 0.93 mg/dL (ref 0.40–1.50)
GFR: 119.37 mL/min (ref >60.00)
Glucose, Bld: 96 mg/dL (ref 70–99)
Potassium: 4.3 mEq/L (ref 3.5–5.1)
Sodium: 138 mEq/L (ref 135–145)
Total Bilirubin: 0.8 mg/dL (ref 0.2–1.2)
Total Protein: 7.9 g/dL (ref 6.0–8.3)

## 2016-12-04 LAB — VITAMIN D 25 HYDROXY (VIT D DEFICIENCY, FRACTURES): VITD: 21.24 ng/mL — ABNORMAL LOW (ref 30.00–100.00)

## 2016-12-04 MED ORDER — SUPREP BOWEL PREP KIT 17.5-3.13-1.6 GM/177ML PO SOLN: ORAL | 0 refills | Status: DC

## 2016-12-04 MED ORDER — METHOTREXATE 2.5 MG PO TABS: ORAL_TABLET | ORAL | 1 refills | Status: DC

## 2016-12-04 NOTE — Progress Notes (Signed)
HPI :  Crohn's history: Ileocolonic Crohn's disease diagnosed in 2015. He presented initially with spontaneous abscess which required drainage. He was followed in [place], [place] and maintained on Imuran 12m daily for 3 years or so, and [facility] another [facility]ation with multiple retroperitoneal abscess in Jan 2018. He was treated with bowel rest and TPN and antibiotics, IR was able to drain one of the abscesses at the time. [facility] after his initial hospitalization, and he declined surgery at the time as he was feeling better. Unfortunately he failed conservative managemenet and had recurrence of abscess formation, [facility] and [facility].   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 Post-operatively he was started on Humira and methotrexate.   SINCE LAST VISIT:  The patient was started on Humira and methotrexate along with folic acid since our last visit. He reports he is tolerating this regimen quite well and denies any side effects. He reports compliance with regimen. He states he is feeling really well, he denies any abdominal pains, he denies any diarrhea or problems with his bowels. He denies any blood in the stools. He is eating well without nausea or vomiting. He has gained weight which is concerned about, ow weighs about 215 pounds. He states is artery had the flu shot this year and [facility]   No tobacco use. He does not drink alcohol.   IBD Health Care Maintenance: Annual Flu Vaccine - UTD 2018 [facility]neum[facility]ine : [facility]  skin test negative 04/12/16 Vitamin D screening - Date: DUE Last Colonoscopy -  2015  Past Medical History:  Diagnosis Date  . Crohn's disease ([facility]      Past Surgical History:  Procedure Laterality Date  . bowel abscess from crohns     . IR GENERIC HISTORICAL  04/05/2016   IR RADIOLOGIST EVAL & MGMT 04/05/2016 JSandi Mariscal MD [facility] INTERV RAD  . LAPAROSCOPIC ILEOCECECTOMY  05/14/2016   with debridement of chronic abscess cavity asnd takedown of fistulas   History reviewed. No pertinent family history. Social History  Substance Use Topics  . Smoking status: Never Smoker  . Smokeless tobacco: Never Used  . Alcohol use No   Current Outpatient Prescriptions  Medication Sig Dispense Refill  . folic acid (FOLVITE) 1 MG tablet Take 1 tablet (1 mg total) by mouth daily. 90 tablet 3  . HUMIRA PEN 40 MG/0.8ML PNKT Inject 40 mg as directed every 14 (fourteen) days.    . methotrexate (RHEUMATREX) 2.5 MG tablet Caution:Chemotherapy. Protect from light.  Take 6 tablets once weekly 72 tablet 1   No current facility-administered medications for this visit.    No Known Allergies   Review of Systems: All systems reviewed and negative except where noted in HPI.   Lab Results  Component Value Date   WBC 6.6 12/04/2016   HGB 14.7 12/04/2016   HCT 43.5 12/04/2016   MCV 89.3 12/04/2016   PLT 262.0 12/04/2016    Lab Results  Component Value Date   CREATININE 0.93 12/04/2016   BUN 12 12/04/2016   NA 138 12/04/2016   K 4.3 12/04/2016   CL 105 12/04/2016   CO2 28 12/04/2016  Lab Results  Component Value Date   ALT 22 12/04/2016   AST 21 12/04/2016   ALKPHOS 52 12/04/2016   BILITOT 0.8 12/04/2016   CBC Latest Ref Rng & Units 12/04/2016 06/18/2016 04/24/2016  WBC 4.0 - 10.5 K/uL 6.6 7.1 12.4(H)  Hemoglobin 13.0 - 17.0 g/dL 14.7 12.5(L) 8.5(L)  Hematocrit 39.0 - 52.0 % 43.5 38.4(L) 26.7(L)  Platelets 150.0 - 400.0 K/uL 262.0 305.0 350     Physical Exam: BP 112/80   Pulse 72   Ht 6' (1.829 m)   Wt 215 lb 6.4 oz (97.7 kg)   BMI 29.21 kg/m   Constitutional: Pleasant,well-developed, male in no acute distress. HEENT: Normocephalic and atraumatic. Conjunctivae are normal. No scleral icterus. Neck supple.  Cardiovascular: Normal rate, regular rhythm.  Pulmonary/chest: Effort normal and breath sounds normal. No wheezing, rales or rhonchi. Abdominal: Soft, nondistended, nontender. There are no masses palpable. No hepatomegaly. Extremities: no edema Lymphadenopathy: No cervical adenopathy noted. Neurological: Alert and oriented to person place and time. Skin: Skin is warm and dry. No rashes noted. Psychiatric: Normal mood and affect. Behavior is normal.   ASSESSMENT AND PLAN: 35 year old male with perforating ileocolonic Crohn's disease, status post multiple hospitalizations for recurrent abscesses while on azathioprine monotherapy. Now he is now status post ileocecectomy with fistula takedown this past Spring and on Humira / Methotrexate combination therapy. Overall he is doing really well and tolerating the medications. Labs repeated today and normal, his anemia has resolved, he has regained weight. We will continue the present regimen along with daily folic acid. I've counseled him extensively on the risks of anti-TNF therapy as well as methotrexate, he wishes to continue. He will continue to avoid smoking cigarettes drinking alcohol. His vaccinations are up-to-date, he is due for vitamin D testing. I think a colonoscopy in the upcoming months would be useful to assess mucosal response to therapy, for mucosal healing.   Plan: - Continue Humira and methotrexate, continue folic acid - Basic labs done today, we'll also check vitamin D and replete if needed - Discuss risks and benefits of colonoscopy with him, wishes to proceed - Healthcare maintenance otherwise up-to-date at this time - Further recommend addition to the result of colonoscopy.   Descanso Cellar, MD [facility] Gastroenterology Pager [phone]

## 2016-12-04 NOTE — Patient Instructions (Signed)
You have been scheduled for a colonoscopy. Please follow written instructions given to you at your visit today.  Please pick up your prep supplies at the pharmacy within the next 1-3 days. If you use inhalers (even only as needed), please bring them with you on the day of your procedure. Your physician has requested that you go to www.startemmi.com and enter the access code given to you at your visit today. This web site gives a general overview about your procedure. However, you should still follow specific instructions given to you by our office regarding your preparation for the procedure.  We have sent the following medications to your pharmacy for you to pick up at your convenience: Methotrexate  Your physician has requested that you go to the basement for the following lab work before leaving today: Cbc, Cmet and vitamin D  If you are age 34 or older, your body mass index should be between 23-30. Your Body mass index is 29.21 kg/m. If this is out of the aforementioned range listed, please consider follow up with your Primary Care Provider.  If you are age 35 or younger, your body mass index should be between 19-25. Your Body mass index is 29.21 kg/m. If this is out of the aformentioned range listed, please consider follow up with your Primary Care Provider.   Thank you.

## 2016-12-05 ENCOUNTER — Other Ambulatory Visit: Payer: Self-pay

## 2016-12-05 DIAGNOSIS — R7989 Other specified abnormal findings of blood chemistry: Secondary | ICD-10-CM

## 2017-01-14 ENCOUNTER — Encounter: Payer: BLUE CROSS/BLUE SHIELD | Admitting: Gastroenterology

## 2017-01-30 ENCOUNTER — Other Ambulatory Visit: Payer: Self-pay | Admitting: Gastroenterology

## 2017-01-30 NOTE — Telephone Encounter (Signed)
Yes you can refill - he is otherwise due for CBC and CMET in January / February time frame. Thanks

## 2017-01-30 NOTE — Telephone Encounter (Signed)
Dr. Loni Muse, ok to refill?

## 2017-02-27 ENCOUNTER — Ambulatory Visit (AMBULATORY_SURGERY_CENTER): Payer: BLUE CROSS/BLUE SHIELD | Admitting: Gastroenterology

## 2017-02-27 ENCOUNTER — Encounter: Payer: Self-pay | Admitting: Gastroenterology

## 2017-02-27 VITALS — BP 101/65 | HR 68 | Temp 96.0°F | Resp 24 | Ht 65.0 in | Wt 215.0 lb

## 2017-02-27 DIAGNOSIS — K50019 Crohn's disease of small intestine with unspecified complications: Secondary | ICD-10-CM

## 2017-02-27 MED ORDER — SODIUM CHLORIDE 0.9 % IV SOLN: 500.0000 mL | INTRAVENOUS | Status: DC

## 2017-02-27 NOTE — Progress Notes (Signed)
Called to room to assist during endoscopic procedure.  Patient ID and intended procedure confirmed with present staff. Received instructions for my participation in the procedure from the performing physician.  

## 2017-02-27 NOTE — Progress Notes (Signed)
Report to PACU, RN, vss, BBS= Clear.  

## 2017-02-27 NOTE — Patient Instructions (Signed)
YOU HAD AN ENDOSCOPIC PROCEDURE TODAY AT [facility]:   Refer to the procedure report that was given to you for any specific questions about what was found during the examination.  If the procedure report does not answer your questions, please call your gastroenterologist to clarify.  If you requested that your care partner not be given the details of your procedure findings, then the procedure report has been included in a sealed envelope for you to review at your convenience later.  YOU SHOULD EXPECT: Some feelings of bloating in the abdomen. Passage of more gas than usual.  Walking can help get rid of the air that was put into your GI tract during the procedure and reduce the bloating. If you had a lower endoscopy (such as a colonoscopy or flexible sigmoidoscopy) you may notice spotting of blood in your stool or on the toilet paper. If you underwent a bowel prep for your procedure, you may not have a normal bowel movement for a few days.  Please Note:  You might notice some irritation and congestion in your nose or some drainage.  This is from the oxygen used during your procedure.  There is no need for concern and it should clear up in a day or so.  SYMPTOMS TO REPORT IMMEDIATELY:   Following lower endoscopy (colonoscopy or flexible sigmoidoscopy):  Excessive amounts of blood in the stool  Significant tenderness or worsening of abdominal pains  Swelling of the abdomen that is new, acute  Fever of 100F or higher  DIET:  We do recommend a small meal at first, but then you may proceed to your regular diet.  Drink plenty of fluids but you should avoid alcoholic beverages for 24 hours.  ACTIVITY:  You should plan to take it easy for the rest of today and you should NOT DRIVE or use heavy machinery until tomorrow (because of the sedation medicines used during the test).    FOLLOW UP: Our staff will call the number listed on your records the next business day following your  procedure to check on you and address any questions or concerns that you may have regarding the information given to you following your procedure. If we do not reach you, we will leave a message.  However, if you are feeling well and you are not experiencing any problems, there is no need to return our call.  We will assume that you have returned to your regular daily activities without incident.  If any biopsies were taken you will be contacted by phone or by letter within the next 1-3 weeks.  Please call us at [phone] if you have not heard about the biopsies in 3 weeks.    SIGNATURES/CONFIDENTIALITY: You and/or your care partner have signed paperwork which will be entered into your electronic medical record.  These signatures attest to the fact that that the information above on your After Visit Summary has been reviewed and is understood.  Full responsibility of the confidentiality of this discharge information lies with you and/or your care-partner.  Await pathology report.

## 2017-02-27 NOTE — Op Note (Signed)
[facility] Patient Name: Larry [name] Procedure Date: 02/27/2017 11:04 AM MRN: [id number] Endoscopist: Remo Lipps P. Caidin Heidenreich MD, MD Age: 37 Referring MD:  Date of Birth: 21-Nov-1980 Gender: Male Account #: [id number] Procedure:                Colonoscopy Indications:              Follow-up of Crohn's disease of the small bowel and                            colon, s/p ileocectomy with takedown of fistula,                            post-operatively placed on Humira and Methotrexate,                            feeling well Medicines:                Monitored Anesthesia Care Procedure:                Pre-Anesthesia Assessment:                           - Prior to the procedure, a History and Physical                            was performed, and patient medications and                            allergies were reviewed. The patient's tolerance of                            previous anesthesia was also reviewed. The risks                            and benefits of the procedure and the sedation                            options and risks were discussed with the patient.                            All questions were answered, and informed consent                            was obtained. Prior Anticoagulants: The patient has                            taken no previous anticoagulant or antiplatelet                            agents. ASA Grade Assessment: II - A patient with                            mild systemic disease. After reviewing the risks  and benefits, the patient was deemed in                            satisfactory condition to undergo the procedure.                           After obtaining informed consent, the colonoscope                            was passed under direct vision. Throughout the                            procedure, the patient's blood pressure, pulse, and                            oxygen saturations were monitored  continuously. The                            Model CF-HQ190L [id number]) scope was introduced                            through the anus and advanced to the the terminal                            ileum. The colonoscopy was performed without                            difficulty. The patient tolerated the procedure                            well. The quality of the bowel preparation was                            excellent. The terminal ileum, surgical                            anastomosis, and the rectum were photographed. Scope In: 11:07:44 AM Scope Out: 11:24:54 AM Scope Withdrawal Time: 0 hours 13 minutes 58 seconds  Total Procedure Duration: 0 hours 17 minutes 10 seconds  Findings:                 The perianal and digital rectal examinations were                            normal.                           There was evidence of a prior end-to-side                            ileo-colonic anastomosis in the right colon. This                            was patent and was characterized by healthy  appearing mucosa. Rutgeerts' score of i0.                           The terminal ileum appeared normal.                           The exam was otherwise without significant                            abnormality on direct and retroflexion views. Very                            faint mild erythema in the rectum, could be due to                            bowel prep artifact, but otherwise no overt                            inflammation. Biopsies taken in right, transverse,                            left colon, and rectum.                           Biopsies were taken with a cold forceps for                            histology. Complications:            No immediate complications. Estimated blood loss:                            Minimal. Estimated Blood Loss:     Estimated blood loss was minimal. Impression:               - Patent end-to-side ileo-colonic  anastomosis,                            characterized by healthy appearing mucosa.                           - The examined portion of the ileum was normal.                           - Very faint rectal erythema. The examination was                            otherwise normal on direct and retroflexion views.                           - Biopsies were taken with a cold forceps for                            histology.  Overall, excellent control of Crohn's on present                            regimen. Recommendation:           - Patient has a contact number available for                            emergencies. The signs and symptoms of potential                            delayed complications were discussed with the                            patient. Return to normal activities tomorrow.                            Written discharge instructions were provided to the                            patient.                           - Resume previous diet.                           - Continue present medications.                           - Await pathology results.                           - Repeat colonoscopy is recommended for                            surveillance. The colonoscopy date will be                            determined after pathology results from today's                            exam become available for review. Remo Lipps P. Ellenor Wisniewski MD, MD 02/27/2017 11:33:01 AM This report has been signed electronically.

## 2017-02-28 ENCOUNTER — Telehealth: Payer: Self-pay | Admitting: *Deleted

## 2017-02-28 ENCOUNTER — Telehealth: Payer: Self-pay

## 2017-02-28 NOTE — Telephone Encounter (Signed)
  Follow up Call-  Call back number 02/27/2017  Post procedure Call Back phone  # [phone]  Permission to leave phone message Yes    [facility]

## 2017-02-28 NOTE — Telephone Encounter (Signed)
  Follow up Call-  Call back number 02/27/2017  Post procedure Call Back phone  # [phone]  Permission to leave phone message Yes      No answer; will try again this afternoon Larry [name]/Recovery Room

## 2017-02-28 NOTE — Telephone Encounter (Signed)
Patient returning phone call states he is doing fine.

## 2017-03-08 ENCOUNTER — Other Ambulatory Visit: Payer: Self-pay

## 2017-03-08 DIAGNOSIS — K50119 Crohn's disease of large intestine with unspecified complications: Secondary | ICD-10-CM

## 2017-03-13 ENCOUNTER — Other Ambulatory Visit: Payer: Self-pay

## 2017-03-20 ENCOUNTER — Telehealth: Payer: Self-pay

## 2017-03-20 NOTE — Telephone Encounter (Signed)
LM for pt that he has labs due.  Asked him to go to the lab sometime in the next couple of weeks and call us if he has any questions.

## 2017-03-21 ENCOUNTER — Telehealth: Payer: Self-pay

## 2017-03-21 NOTE — Telephone Encounter (Signed)
Left two messages for pt that it is time for him to come in and have labs done.  Reminded him of the lab hours.

## 2017-03-21 NOTE — Telephone Encounter (Signed)
-----   Message from Roetta Sessions, [place] sent at 01/31/2017  8:05 AM EST ----- Regarding: cbc and cmet  Cbc and CMET due Jan/Feb

## 2017-04-25 ENCOUNTER — Ambulatory Visit: Payer: BLUE CROSS/BLUE SHIELD | Admitting: Gastroenterology

## 2017-05-16 ENCOUNTER — Telehealth: Payer: Self-pay

## 2017-05-16 NOTE — Telephone Encounter (Signed)
LM for pt that he has an appt on 06-03-17 at 10:45am and reminded him that he still have labs due

## 2017-06-03 ENCOUNTER — Encounter: Payer: Self-pay | Admitting: Gastroenterology

## 2017-06-03 ENCOUNTER — Other Ambulatory Visit (INDEPENDENT_AMBULATORY_CARE_PROVIDER_SITE_OTHER): Payer: BLUE CROSS/BLUE SHIELD

## 2017-06-03 ENCOUNTER — Ambulatory Visit: Payer: BLUE CROSS/BLUE SHIELD | Admitting: Gastroenterology

## 2017-06-03 ENCOUNTER — Encounter (INDEPENDENT_AMBULATORY_CARE_PROVIDER_SITE_OTHER): Payer: Self-pay

## 2017-06-03 VITALS — BP 102/62 | HR 69 | Ht 72.0 in | Wt 226.0 lb

## 2017-06-03 DIAGNOSIS — R7989 Other specified abnormal findings of blood chemistry: Secondary | ICD-10-CM

## 2017-06-03 DIAGNOSIS — Z79899 Other long term (current) drug therapy: Secondary | ICD-10-CM

## 2017-06-03 DIAGNOSIS — K50819 Crohn's disease of both small and large intestine with unspecified complications: Secondary | ICD-10-CM | POA: Diagnosis not present

## 2017-06-03 DIAGNOSIS — K50119 Crohn's disease of large intestine with unspecified complications: Secondary | ICD-10-CM | POA: Diagnosis not present

## 2017-06-03 LAB — CBC WITH DIFFERENTIAL/PLATELET
BASOS ABS: 0.1 10*3/uL (ref 0.0–0.1)
Basophils Relative: 0.9 % (ref 0.0–3.0)
EOS ABS: 0.1 10*3/uL (ref 0.0–0.7)
Eosinophils Relative: 2 % (ref 0.0–5.0)
HCT: 43.8 % (ref 39.0–52.0)
Hemoglobin: 15 g/dL (ref 13.0–17.0)
LYMPHS ABS: 1.7 10*3/uL (ref 0.7–4.0)
LYMPHS PCT: 27.3 % (ref 12.0–46.0)
MCHC: 34.2 g/dL (ref 30.0–36.0)
MCV: 87.2 fl (ref 78.0–100.0)
MONOS PCT: 9.9 % (ref 3.0–12.0)
Monocytes Absolute: 0.6 10*3/uL (ref 0.1–1.0)
NEUTROS PCT: 59.9 % (ref 43.0–77.0)
Neutro Abs: 3.7 10*3/uL (ref 1.4–7.7)
Platelets: 226 10*3/uL (ref 150.0–400.0)
RBC: 5.03 Mil/uL (ref 4.22–5.81)
RDW: 13.5 % (ref 11.5–15.5)
WBC: 6.1 10*3/uL (ref 4.0–10.5)

## 2017-06-03 LAB — COMPREHENSIVE METABOLIC PANEL
ALK PHOS: 46 U/L (ref 39–117)
ALT: 22 U/L (ref 0–53)
AST: 20 U/L (ref 0–37)
Albumin: 4.2 g/dL (ref 3.5–5.2)
BILIRUBIN TOTAL: 0.7 mg/dL (ref 0.2–1.2)
BUN: 12 mg/dL (ref 6–23)
CO2: 27 mEq/L (ref 19–32)
CREATININE: 1.06 mg/dL (ref 0.40–1.50)
Calcium: 9.3 mg/dL (ref 8.4–10.5)
Chloride: 106 mEq/L (ref 96–112)
GFR: 102.35 mL/min (ref >60.00)
GLUCOSE: 89 mg/dL (ref 70–99)
Potassium: 3.9 mEq/L (ref 3.5–5.1)
Sodium: 139 mEq/L (ref 135–145)
TOTAL PROTEIN: 8.1 g/dL (ref 6.0–8.3)

## 2017-06-03 LAB — VITAMIN D 25 HYDROXY (VIT D DEFICIENCY, FRACTURES): VITD: 21.74 ng/mL — ABNORMAL LOW (ref 30.00–100.00)

## 2017-06-03 NOTE — Progress Notes (Signed)
HPI :  Crohn's history: Ileocolonic Crohn's disease diagnosed in 2015. He presented initially with spontaneous abscess which required drainage. He was followed in [place], [place] maintained on Imuran 187m daily for 3 years or so, and [facility] with multiple retroperitoneal abscess in Jan 2018. He was treated with bowel rest and TPN and antibiotics, IR was able to drain one of the abscesses at the time. [facility] AD[facility] Fl[facility] after his initial hospitalization, and he declined surgery at the time as he was feeling better. Unfortunately he failed conservative managemenet and had recurrence of abscess formation, [facility] and [facility].   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 Post-operatively he was started on Humira and methotrexate.   SINCE LAST VISIT:  The patient had a colonoscopy in January since his last visit. The ileocolonic anastomosis was healthy and normal, without any active inflammation. He had a normal healthy with the exception of some faint erythema in the rectum. Biopsies obtained showed mild chronic active colitis.   He has continued Humira and methotrexate since her last visit. He is responding to it quite well and states he has no side effects from either regimen. He missing any doses. He is taking folic acid supplement daily, states he has a hard time remembering to take his vitamin D at times. He now weighs around 220 pounds, he previously weighed around 150 pounds when hospitalized last year. He denies any abdominal pains. He reports his bowel habits are stable, no diarrhea. No rectal bleeding.   He is due for tuberculosis testing at this time. 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   IBD Health Care Maintenance: Annual Flu Vaccine- UTD 2018 [facility] test negative 04/12/16 Vitamin Dscreening - Date: deficient Last Colonoscopy 02/27/2017 - healthy anastomosis, normal ileum, faint erythema in rectum - "chronic active colitis" on path      Past Medical History:  Diagnosis Date  . Crohn's disease ([place]   . Vitamin D deficiency      Past Surgical History:  Procedure Laterality Date  . bowel abscess from crohns     . IR GENERIC HISTORICAL  04/05/2016   IR RADIOLOGIST EVAL & MGMT 04/05/2016 JSandi Mariscal MD [facility] INTERV RAD  . LAPAROSCOPIC ILEOCECECTOMY  05/14/2016   with debridement of chronic abscess cavity asnd takedown of fistulas   Family History  Problem Relation Age of Onset  . Stomach cancer Neg Hx   . Colon cancer Neg Hx    Social History   Tobacco Use  . Smoking status: Never Smoker  . Smokeless tobacco: Never Used  Substance Use Topics  . Alcohol use: No  . Drug use: No   Current Outpatient Medications  Medication Sig Dispense Refill  . folic acid (FOLVITE) 1 MG tablet Take 1 tablet (1 mg total) by mouth daily. 90 tablet 3  . HUMIRA PEN 40 MG/0.8ML PNKT INJECT 40 MG UNDER THE SKIN EVERY TWO WEEKS 2 each 6  . methotrexate (RHEUMATREX) 2.5 MG tablet Caution:Chemotherapy. Protect from light.  Take 6 tablets once weekly 72 tablet 1   Current Facility-Administered Medications  Medication Dose Route Frequency Provider Last Rate Last Dose  . 0.9 %  sodium chloride infusion  500 mL Intravenous Continuous Armbruster, Carlota Raspberry, MD       No Known Allergies   Review of Systems: All systems reviewed and negative except where noted in HPI.   Lab Results  Component Value Date   WBC 6.6 12/04/2016   HGB 14.7 12/04/2016   HCT 43.5 12/04/2016   MCV 89.3 12/04/2016   PLT 262.0 12/04/2016    Lab Results  Component Value Date    CREATININE 0.93 12/04/2016   BUN 12 12/04/2016   NA 138 12/04/2016   K 4.3 12/04/2016   CL 105 12/04/2016   CO2 28 12/04/2016    Lab Results  Component Value Date   ALT 22 12/04/2016   AST 21 12/04/2016   ALKPHOS 52 12/04/2016   BILITOT 0.8 12/04/2016     Physical Exam: BP 102/62   Pulse 69   Ht 6' (1.829 m)   Wt 226 lb (102.5 kg)   BMI 30.65 kg/m  Constitutional: Pleasant,well-developed, male in no acute distress. HEENT: Normocephalic and atraumatic. Conjunctivae are normal. No scleral icterus. Neck supple.  Cardiovascular: Normal rate, regular rhythm.  Pulmonary/chest: Effort normal and breath sounds normal. No wheezing, rales or rhonchi. Abdominal: Soft, nondistended, nontender. There are no masses palpable. No hepatomegaly. Extremities: no edema Lymphadenopathy: No cervical adenopathy noted. Neurological: Alert and oriented to person place and time. Skin: Skin is warm and dry. No rashes noted. Psychiatric: Normal mood and affect. Behavior is normal.   ASSESSMENT AND PLAN: 37 year old male here for reassessment following issues:  Crohn's disease - ileocolonic involvement with history of perforating complications. Now status post ileocecectomy with fistula takedown in 2018, followed by combination therapy with Humira and methotrexate. Overall doing really well since these interventions, his colonoscopy showed no recurrence at the anastomosis, with microscopic active colitis noted in the colon. I counseled him extensively in the past about the long-term risks of this regimen. He appears to be tolerating it quite well, and I think it is working well for him. He is due for maintenance labs today, we will recheck vitamin D and replete if needed. He is also due for tuberculosis testing, will have PPD done given indeterminate result on prior quantiferon gold testing, otherwise healthcare maintenance is up-to-date. I will check a fecal calprotectin in light of the biopsy results to  ensure this is normal. Recommend we continue methotrexate for at least 1-2 years following initiation of therapy to help minimize recurrence of immunogenicity to Humira. I completed paperwork for disability related to his prior hospitalizations last year. I will see him again in 6 months, or sooner with any issues.  Lovingston Cellar, MD [facility]

## 2017-06-03 NOTE — Patient Instructions (Addendum)
If you are age 35 or older, your body mass index should be between 23-30. Your Body mass index is 30.65 kg/m. If this is out of the aforementioned range listed, please consider follow up with your Primary Care Provider.  If you are age 14 or younger, your body mass index should be between 19-25. Your Body mass index is 30.65 kg/m. If this is out of the aformentioned range listed, please consider follow up with your Primary Care Provider.   Please go to the lab in the basement of our building to have lab work done as you leave today.  You have an appointment on Monday, 06-10-17 at 11:00am to receive your PPD injection.  Thank you for entrusting me with your care and for choosing [facility], Dr. Willoughby Cellar

## 2017-06-18 ENCOUNTER — Ambulatory Visit (INDEPENDENT_AMBULATORY_CARE_PROVIDER_SITE_OTHER): Payer: BLUE CROSS/BLUE SHIELD | Admitting: Gastroenterology

## 2017-06-18 DIAGNOSIS — K50819 Crohn's disease of both small and large intestine with unspecified complications: Secondary | ICD-10-CM

## 2017-06-21 LAB — TB SKIN TEST
Induration: 0 mm
TB SKIN TEST: NEGATIVE

## 2017-09-26 ENCOUNTER — Telehealth: Payer: Self-pay

## 2017-09-26 DIAGNOSIS — R7989 Other specified abnormal findings of blood chemistry: Secondary | ICD-10-CM

## 2017-09-26 NOTE — Telephone Encounter (Signed)
-----   Message from Roetta Sessions, [place] sent at 06/04/2017  8:20 AM EDT ----- Regarding: repeat Vitamin D Check vitamin D level (vitamin D deficiency)  Hopefully he has been taking 4000 iu/day.

## 2017-09-26 NOTE — Telephone Encounter (Signed)
Called pt.  His phone number is no longer valid.  Mailed letter asking pt to go to the lab for Vitamin D and to call us with updated contact information.

## 2017-10-15 ENCOUNTER — Other Ambulatory Visit: Payer: Self-pay | Admitting: Gastroenterology

## 2017-12-03 ENCOUNTER — Ambulatory Visit: Payer: BLUE CROSS/BLUE SHIELD | Admitting: Critical Care Medicine

## 2017-12-21 IMAGING — CT CT ABD-PELV W/ CM
2 of 4 series · 14 of 46 positions shown, 16 images · IV contrast (ISOVUE)
Comparison: 04/05/2016

CLINICAL DATA: Crohn's disease.

EXAM:
CT ABDOMEN AND PELVIS WITH CONTRAST
TECHNIQUE: Multidetector CT imaging of the abdomen and pelvis was performed
using the standard protocol following bolus administration of
intravenous contrast.
CONTRAST:  1 MH7YUV-BZZ IOPAMIDOL (MH7YUV-BZZ) INJECTION 61%

[Series 2: abd/pel with · axial · 0.84mm/px · z∈[+699,+1239]mm · 11 of 121 slices shown, 13 images]
[im 7/121  soft-tissue]
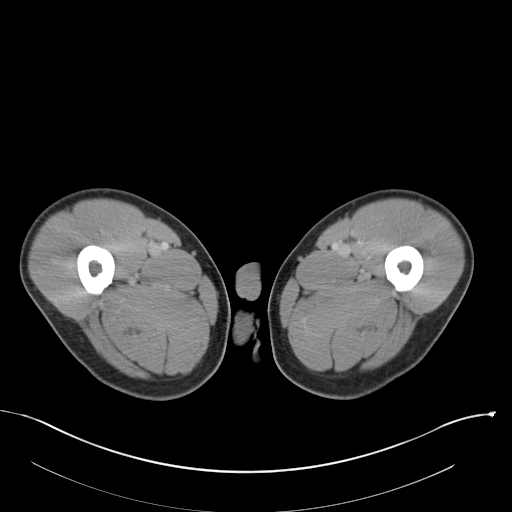
[im 7/121  bone]
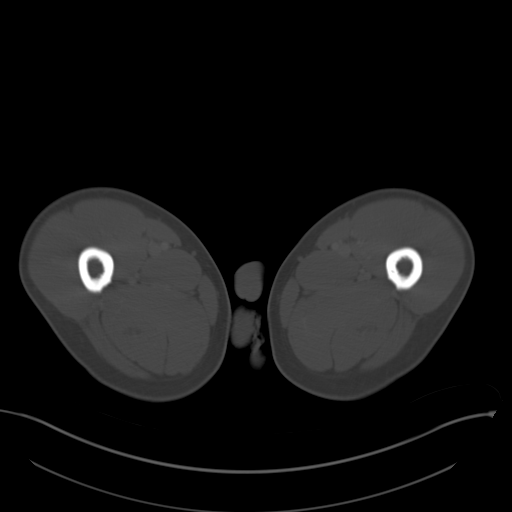
[im 19/121  soft-tissue]
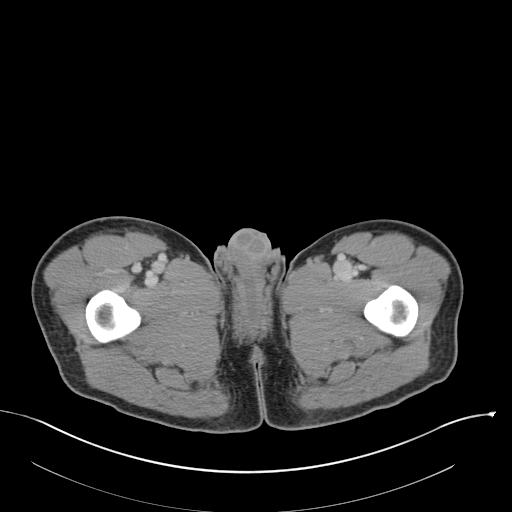
[im 31/121  soft-tissue]
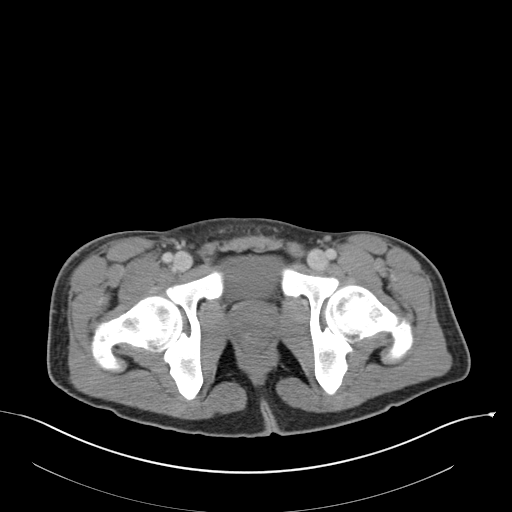
[im 43/121  soft-tissue]
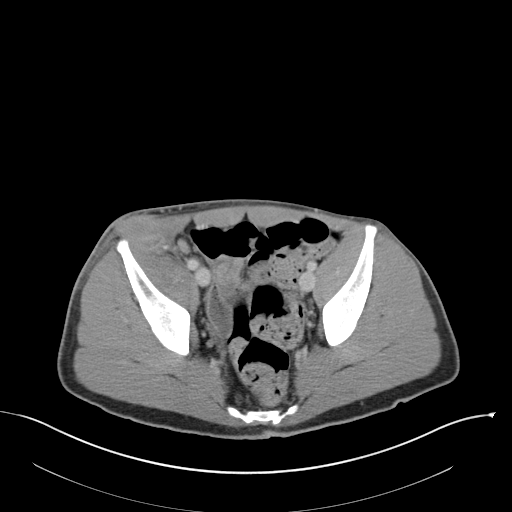
[im 49/121  soft-tissue]
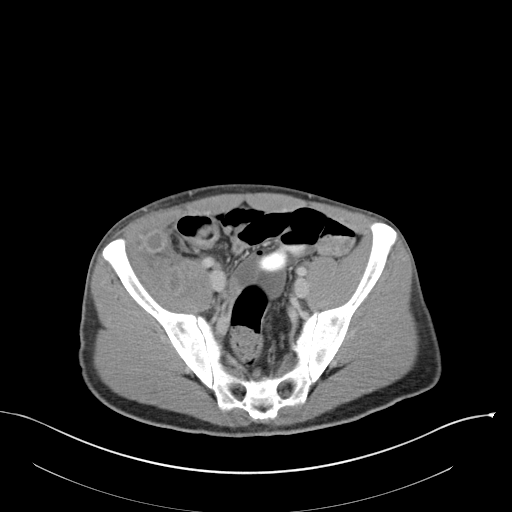
[im 61/121  soft-tissue]
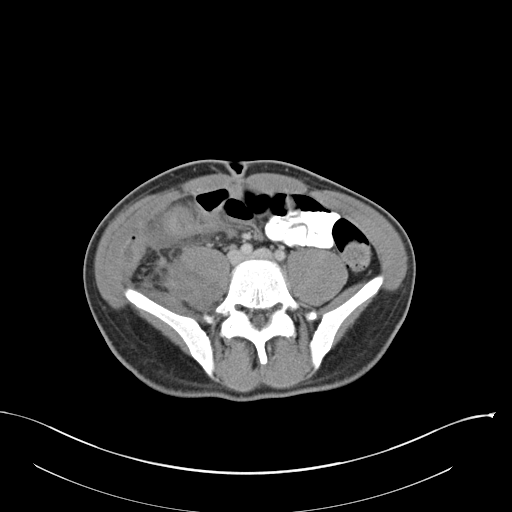
[im 73/121  soft-tissue]
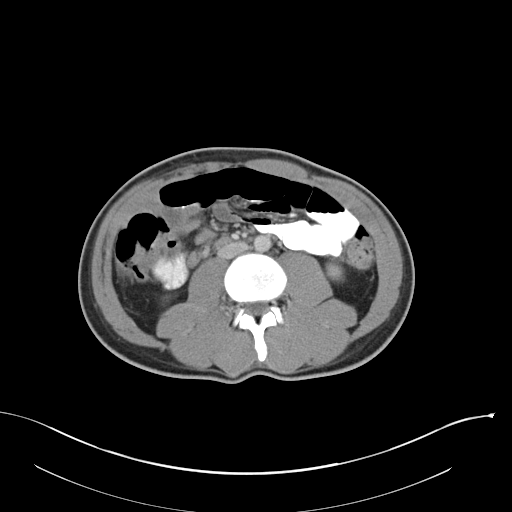
[im 79/121  soft-tissue]
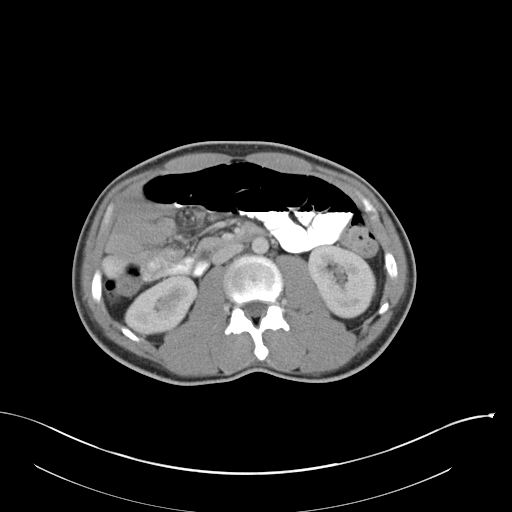
[im 91/121  soft-tissue]
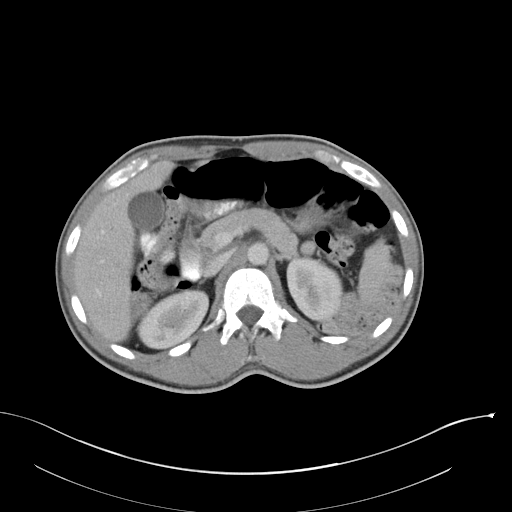
[im 91/121  bone]
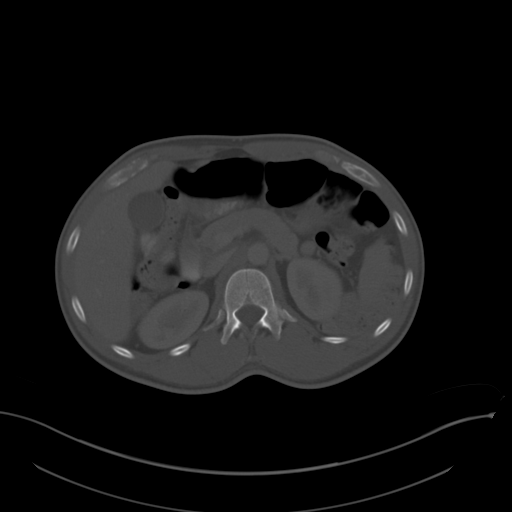
[im 103/121  soft-tissue]
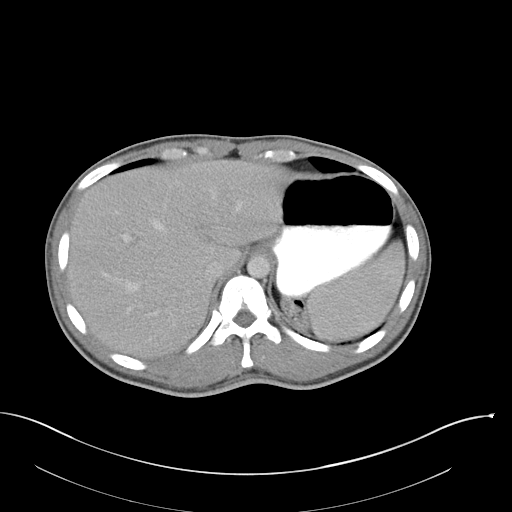
[im 115/121  soft-tissue]
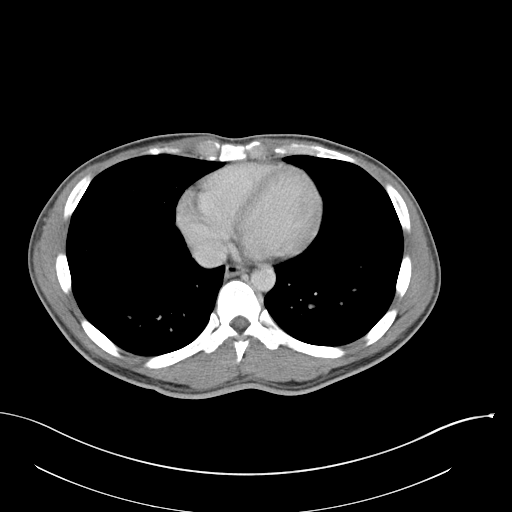

[Series 3: coronal a/|p · coronal · 0.74mm/px · 3 of 118 slices shown]
[im 40/118  soft-tissue]
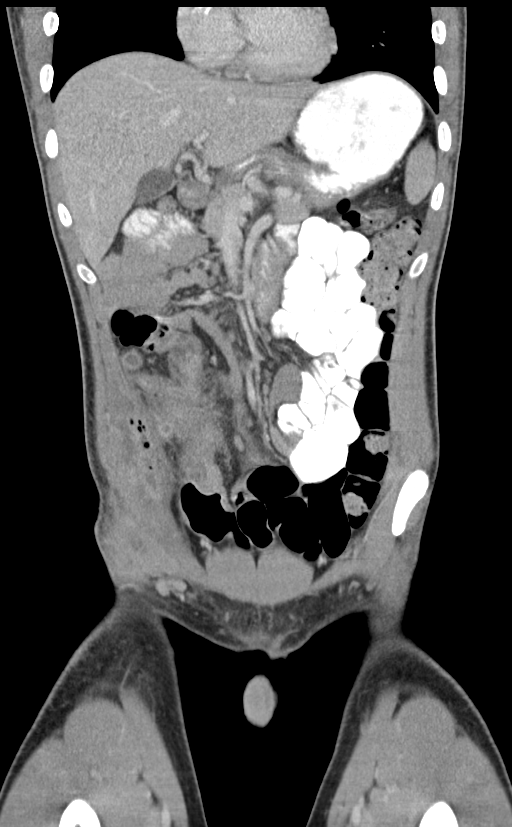
[im 53/118  soft-tissue]
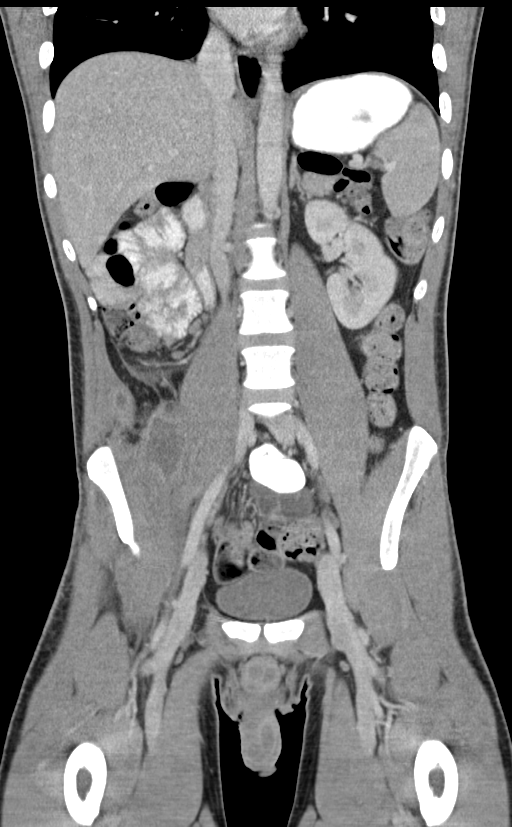
[im 66/118  soft-tissue]
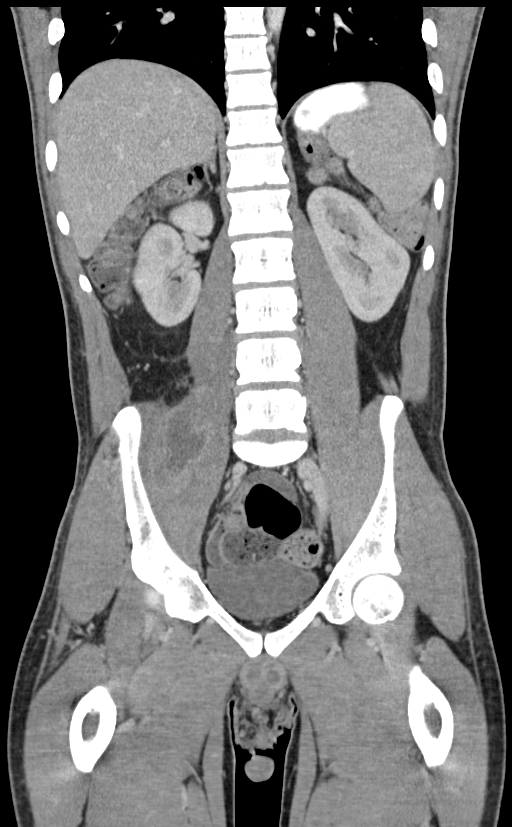

[14 of 46 positions shown; findings below may reference images not displayed]

FINDINGS: Lower chest: The lung bases are clear.  No pleural effusion.

Hepatobiliary: No focal liver abnormality is seen. No gallstones,
gallbladder wall thickening, or biliary dilatation.

Pancreas: Unremarkable. No pancreatic ductal dilatation or
surrounding inflammatory changes.

Spleen: Normal in size without focal abnormality.

Adrenals/Urinary Tract: Adrenal glands are unremarkable. Kidneys are
normal, without renal calculi, focal lesion, or hydronephrosis.
Bladder is unremarkable.

Stomach/Bowel: The stomach appears normal. There is persistent and
progressive inflammatory changes involving the right lower quadrant
bowel loops including the terminal ileum. Evidence of persistent and
recurrent penetrating disease with fistula formation is identified 2
previously drained abscess cavity, image 64 series 2. There is a
recurrent abscess within the right lower quadrant which extends into
the ileo psoas muscle. There is also a enterocutaneous fistula or
sinus tract which extends towards the anterior aspect of the right
upper thigh, image number 83 of series 2. Right lower quadrant
abscess measures 7.2 by 3.0 by 3.9 cm, image 65 of series 2 and
image 57 of series 3. Enterocutaneous fistula/ sinus tract extending
to the anterior aspect of the upper thigh it measures approximately
11.3 cm, image 45 of series 3. No evidence for bowel obstruction.

Vascular/Lymphatic: No significant vascular findings are present. No
enlarged abdominal or pelvic lymph nodes.

Reproductive: Uterus and bilateral adnexa are unremarkable.

Other: No abdominal wall hernia or abnormality. No abdominopelvic
ascites.

Musculoskeletal: No acute or significant osseous findings.
IMPRESSION: 1. Examination is positive for persistent progressive inflammatory
changes of Crohn's enteritis involving the right lower quadrant
bowel loops.
2. Progressive changes of penetrating disease identified with
recurrent right lower quadrant abscess involving the psoas muscle
and interval development of enterocutaneous fistula/sinus tract to
the anterior aspect of the right upper thigh.
3. No evidence for bowel obstruction.

## 2018-04-22 ENCOUNTER — Ambulatory Visit: Payer: BLUE CROSS/BLUE SHIELD | Admitting: Gastroenterology

## 2018-04-28 ENCOUNTER — Telehealth: Payer: Self-pay | Admitting: Gastroenterology

## 2018-04-28 NOTE — Telephone Encounter (Signed)
Larry [name] let's please resume both the Humira and methotrexate as previously dosed. He needs to let us know if he stops medication in advance. He had been doing really well on the regimen, there is a risk it may not do as well. He is due for CBC and CMET if you can have him go to the lab and I will see him again in April. Thanks

## 2018-04-28 NOTE — Telephone Encounter (Signed)
Spoke with the patient. He had his last humira injection in early December. He cannot remember the last Methotrexate, but thinks it was in January. Due TB screening in April. His appointment with you in is April also. Please advise.

## 2018-04-28 NOTE — Telephone Encounter (Signed)
Beth has Dr. Havery Moros this week.

## 2018-04-28 NOTE — Telephone Encounter (Signed)
Pt is requesting rf for humira, he stated that because of not having insurance he has not rf humira since December of last year. He has insurance again and is scheduled to see Dr. Havery Moros on 4/6.

## 2018-04-29 ENCOUNTER — Other Ambulatory Visit: Payer: Self-pay

## 2018-04-29 DIAGNOSIS — K50819 Crohn's disease of both small and large intestine with unspecified complications: Secondary | ICD-10-CM

## 2018-04-29 MED ORDER — METHOTREXATE 2.5 MG PO TABS: ORAL_TABLET | ORAL | 2 refills | Status: DC

## 2018-04-29 MED ORDER — ADALIMUMAB 40 MG/0.8ML ~~LOC~~ AJKT: 40.0000 mg | AUTO-INJECTOR | SUBCUTANEOUS | 2 refills | Status: DC

## 2018-04-29 NOTE — Telephone Encounter (Signed)
Patient notified. Orders placed.  May want to discuss changing to CF Humira at his office visit. We will have to have a copy of his new insurance card in the system to do a PA for citrate free.

## 2018-05-29 ENCOUNTER — Other Ambulatory Visit: Payer: Self-pay

## 2018-05-29 ENCOUNTER — Other Ambulatory Visit (INDEPENDENT_AMBULATORY_CARE_PROVIDER_SITE_OTHER): Payer: BLUE CROSS/BLUE SHIELD

## 2018-05-29 DIAGNOSIS — K50819 Crohn's disease of both small and large intestine with unspecified complications: Secondary | ICD-10-CM | POA: Diagnosis not present

## 2018-05-29 DIAGNOSIS — R7989 Other specified abnormal findings of blood chemistry: Secondary | ICD-10-CM

## 2018-05-29 LAB — CBC WITH DIFFERENTIAL/PLATELET
Basophils Absolute: 0 10*3/uL (ref 0.0–0.1)
Basophils Relative: 0.5 % (ref 0.0–3.0)
Eosinophils Absolute: 0.2 10*3/uL (ref 0.0–0.7)
Eosinophils Relative: 2.2 % (ref 0.0–5.0)
HCT: 43.7 % (ref 39.0–52.0)
Hemoglobin: 15 g/dL (ref 13.0–17.0)
Lymphocytes Relative: 25.3 % (ref 12.0–46.0)
Lymphs Abs: 1.7 10*3/uL (ref 0.7–4.0)
MCHC: 34.4 g/dL (ref 30.0–36.0)
MCV: 84.2 fl (ref 78.0–100.0)
Monocytes Absolute: 0.5 10*3/uL (ref 0.1–1.0)
Monocytes Relative: 7.8 % (ref 3.0–12.0)
Neutro Abs: 4.4 10*3/uL (ref 1.4–7.7)
Neutrophils Relative %: 64.2 % (ref 43.0–77.0)
Platelets: 256 10*3/uL (ref 150.0–400.0)
RBC: 5.19 Mil/uL (ref 4.22–5.81)
RDW: 13.2 % (ref 11.5–15.5)
WBC: 6.8 10*3/uL (ref 4.0–10.5)

## 2018-05-29 LAB — COMPREHENSIVE METABOLIC PANEL
ALT: 33 U/L (ref 0–53)
AST: 21 U/L (ref 0–37)
Albumin: 4.4 g/dL (ref 3.5–5.2)
Alkaline Phosphatase: 58 U/L (ref 39–117)
BUN: 12 mg/dL (ref 6–23)
CO2: 27 mEq/L (ref 19–32)
Calcium: 9.6 mg/dL (ref 8.4–10.5)
Chloride: 105 mEq/L (ref 96–112)
Creatinine, Ser: 1 mg/dL (ref 0.40–1.50)
GFR: 102.41 mL/min (ref >60.00)
Glucose, Bld: 117 mg/dL — ABNORMAL HIGH (ref 70–99)
Potassium: 4 mEq/L (ref 3.5–5.1)
Sodium: 139 mEq/L (ref 135–145)
Total Bilirubin: 0.6 mg/dL (ref 0.2–1.2)
Total Protein: 8.3 g/dL (ref 6.0–8.3)

## 2018-05-29 LAB — VITAMIN D 25 HYDROXY (VIT D DEFICIENCY, FRACTURES): VITD: 17.55 ng/mL — ABNORMAL LOW (ref 30.00–100.00)

## 2018-05-29 NOTE — Progress Notes (Signed)
Pt going to lab today.  Add Vitamin D per Dr. Havery Moros

## 2018-05-31 LAB — QUANTIFERON-TB GOLD PLUS
Mitogen-NIL: >10 IU/mL
NIL: 0.01 IU/mL
QuantiFERON-TB Gold Plus: NEGATIVE
TB1-NIL: 0.01 IU/mL
TB2-NIL: 0 IU/mL

## 2018-06-02 ENCOUNTER — Other Ambulatory Visit: Payer: Self-pay

## 2018-06-02 ENCOUNTER — Ambulatory Visit (INDEPENDENT_AMBULATORY_CARE_PROVIDER_SITE_OTHER): Payer: BLUE CROSS/BLUE SHIELD | Admitting: Gastroenterology

## 2018-06-02 DIAGNOSIS — E559 Vitamin D deficiency, unspecified: Secondary | ICD-10-CM | POA: Diagnosis not present

## 2018-06-02 DIAGNOSIS — K50819 Crohn's disease of both small and large intestine with unspecified complications: Secondary | ICD-10-CM | POA: Diagnosis not present

## 2018-06-02 DIAGNOSIS — Z79899 Other long term (current) drug therapy: Secondary | ICD-10-CM | POA: Diagnosis not present

## 2018-06-02 MED ORDER — FOLIC ACID 1 MG PO TABS: 1.0000 mg | ORAL_TABLET | Freq: Every day | ORAL | 3 refills | Status: DC

## 2018-06-02 NOTE — Progress Notes (Signed)
Virtual Visit via Video Note  I connected with Larry [name] on 06/02/18 at  2:00 PM EDT by a video enabled telemedicine application and verified that I am speaking with the correct person using two identifiers.   I discussed the limitations of evaluation and management by telemedicine and the availability of in person appointments. The patient expressed understanding and agreed to proceed.  THIS ENCOUNTER IS A VIRTUAL VISIT DUE TO COVID-19 - PATIENT WAS NOT SEEN IN THE OFFICE. PATIENT HAS CONSENTED TO VIRTUAL VISIT / TELEMEDICINE VISIT   Location of patient: home Location of provider: office Name of referring provider: NA, follow up visit  Persons participating: myself, patient   HPI :  Crohn's history: Ileocolonic Crohn's disease diagnosed in 2015. He presented initially with spontaneous abscess which required drainage. He was followed in [place], [place] maintained on Imuran 140m daily for 3 years or so, and [facility] with multiple retroperitoneal abscess in Jan 2018. He was treated with bowel rest and TPN and antibiotics, IR was able to drain one of the abscesses at the time. [facility] after his initial hospitalization, and he declined surgery at the time as he was feeling better. Unfortunately he failed conservative managemenet and had recurrence of abscess formation, [facility].   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   SINCE LAST VISIT:  The patient has been doing okay in general. His last colonoscopy in Jan 2019 showed a healthy surgical anastomosis with normal ileum, faint erythema in the  rectum with chronic active colitis on path. He had been maintained on Humira 466monce every other week and methotrexate 1576mer week and doing quite well on that regimen since his last operation. Unfortunately due to financial / insurance reasons he came off the Humira and methotrexate in December. He was eventually able to resume them last week and took both without any issues. He has tolerated both historically. He has not renewed the folic acid supplement. Generally since I've seen him he's felt pretty well. No bowel problems. No blood in the stools. He had a few sporadic abdominal cramps in recent months but nothing significant. He had some mild loss of appetite off the meds but eating okay and weight stable. 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 Otherwise feeling well.   Labs done last week stable. He is vitamin D deficient, has not been taking vitamin D recently. Has been taking general MVI.  IBD Health Care Maintenance: Annual Flu Vaccine- [id number] Vaccine: -[facility] test negative 04/12/16, QG negative on 05/29/18 Vitamin Dscreening - Date: deficient - level of 17.55 on 05/29/18 Last Colonoscopy 02/27/2017 - healthy anastomosis, normal ileum, faint erythema in rectum - "chronic active colitis" on path    Past Medical History:  Diagnosis Date  . [place] disease ([place] . Vitamin D deficiency      Past Surgical History:  Procedure Laterality Date  . bowel abscess from crohns     . IR GENERIC HISTORICAL  04/05/2016   IR RADIOLOGIST EVAL & MGMT 04/05/2016  Sandi Mariscal, MD [facility] INTERV RAD  . LAPAROSCOPIC ILEOCECECTOMY  05/14/2016   with debridement of chronic abscess cavity asnd takedown of fistulas   Family History  Problem Relation Age of Onset  . Stomach cancer Neg Hx   . Colon  cancer Neg Hx    Social History   Tobacco Use  . Smoking status: Never Smoker  . Smokeless tobacco: Never Used  Substance Use Topics  . Alcohol use: No  . Drug use: No   Current Outpatient Medications  Medication Sig Dispense Refill  . Adalimumab (HUMIRA PEN) 40 MG/0.8ML PNKT 40 mg by Subtenons route every 14 (fourteen) days. 2 each 2  . folic acid (FOLVITE) 1 MG tablet Take 1 tablet (1 mg total) by mouth daily. 90 tablet 3  . methotrexate (RHEUMATREX) 2.5 MG tablet TAKE 6 TABLET(S) BY MOUTH ONCE WEEKLY 24 tablet 2   Current Facility-Administered Medications  Medication Dose Route Frequency Provider Last Rate Last Dose  . 0.9 %  sodium chloride infusion  500 mL Intravenous Continuous Jackie Russman, Carlota Raspberry, MD       No Known Allergies   Review of Systems: All systems reviewed and negative except where noted in HPI.   Lab Results  Component Value Date   WBC 6.8 05/29/2018   HGB 15.0 05/29/2018   HCT 43.7 05/29/2018   MCV 84.2 05/29/2018   PLT 256.0 05/29/2018    Lab Results  Component Value Date   CREATININE 1.00 05/29/2018   BUN 12 05/29/2018   NA 139 05/29/2018   K 4.0 05/29/2018   CL 105 05/29/2018   CO2 27 05/29/2018    Lab Results  Component Value Date   ALT 33 05/29/2018   AST 21 05/29/2018   ALKPHOS 58 05/29/2018   BILITOT 0.6 05/29/2018     Physical Exam: NA  ASSESSMENT AND PLAN:  38 y/o male here for reassessment of the following issues:  Crohn's disease / high risk medication use / vitamin D deficiency - ileocolonic Crohn's complicated with fistula / abscess development leading to surgery in 2018, followed by therapy with Humira and methotrexate. Generally has done really well since surgery and being on this regimen, unfortunately he came off the regimen for a few months recently but has since resumed it. I counseled him on the regimen, risks / benefits. Given his aggressive disease phenotype I recommend we continue Humira indefinitely unless it  stops working or he doesn't tolerate it. I would continue methorexate for now to minimize risk of immunogenicity to Humira especially in light of the drug holiday. We discussed risks for immunogenicity with interruption of the drug regimen, he understood and will contact me if there are issues in the future with this. Otherwise I think it is helping him, he's continued to do well. His labs are UTD and normal, last colonoscopy showed good healing at the anastomosis. Will continue for now, refilled folic acid to take daily while on methotrexate. We may continue methotraxte for another year but will depend on how he does. Pneumovacc and TB testing UTD. I recommend he take 4000 IU of vitamin D supplementation daily given his chronically low levels and will repeat the lab again in 6 months. Otherwise due for routine maintenance labs in 3 or so, and a clinic visit in 6 months. He agreed. All questions answered.  Richfield Cellar, MD [facility]

## 2018-06-12 ENCOUNTER — Telehealth: Payer: Self-pay | Admitting: Gastroenterology

## 2018-06-12 NOTE — Telephone Encounter (Signed)
Pt is taking methotrexate, Humira Pen and vitamin D.  Pt stated that he would like to try a testosterone booster.  He inquired whether the testosterone would cause any rxns with any of his other medications.

## 2018-06-12 NOTE — Telephone Encounter (Signed)
Called patient and gave Dr. Doyne Keel  recommendation

## 2018-06-12 NOTE — Telephone Encounter (Signed)
Larry [name] can you relay, I don't normally recommend testosterone boosters, can have side effects and risks, etc. Unless he has low testosterone documented or symptoms concerning for low testosterone, I would recommend checking the level prior to recommending replacement. If he has a PCP, may be better to ask them about it. Thanks

## 2018-09-03 ENCOUNTER — Telehealth: Payer: Self-pay

## 2018-09-03 ENCOUNTER — Other Ambulatory Visit: Payer: Self-pay

## 2018-09-03 DIAGNOSIS — K50819 Crohn's disease of both small and large intestine with unspecified complications: Secondary | ICD-10-CM

## 2018-09-03 NOTE — Progress Notes (Signed)
Lm for pt that Dr. Loni Muse would like for him to have cbc and CMET this month and to please go to the lab. Letter sent to pt.

## 2018-09-03 NOTE — Telephone Encounter (Signed)
-----   Message from Roetta Sessions, [place] sent at 06/04/2018  7:53 AM EDT ----- Regarding: labs due in [facility] due in July cbc and cmet

## 2018-09-04 ENCOUNTER — Telehealth: Payer: Self-pay | Admitting: Gastroenterology

## 2018-09-05 NOTE — Telephone Encounter (Signed)
This is very unfortunate. He has aggressive Crohn's disease and warrants biologic therapy - unfortunately all the alternatives to Humira will be just as expensive but he should call his insurance company and see what would be covered under that plan. My concern is that if he comes off Humira for too long and needs to resume it, it may not work nearly as well and increases the risk for him to develop resistance to it. Higher dose oral methotrexate will not add much. We could switch his dose to 57m SQ methotrexate if he is certain he cannot obtain Humira or another biologic, but I'm concerned this won't work too well on its own. There are only so many options for Crohn's disease therapy, so if he can't continue Humira this is very unfortunate as he may lose a therapy that is working for him and risk of disease recurrence is high. Please tell him to contact his insurance company to see what biologic therapy they cover for Crohn's. If none, I'm not sure if he has the option to change his insurance right now. Can you let me know what he thinks. Thanks

## 2018-09-08 NOTE — Telephone Encounter (Signed)
LM for pt to call back to discuss Dr. Doyne Keel recommedations

## 2018-09-09 NOTE — Telephone Encounter (Signed)
Called and Left a detailed message for pt with Dr. Doyne Keel recommendations. Asked him to call his insurance company and see what other biologic they might cover.  We can discuss SQ methotrexate otherwise.

## 2018-09-12 NOTE — Telephone Encounter (Signed)
Patient called in requesting to speak with nurse to discuss medication. He would like a call back.

## 2018-09-12 NOTE — Telephone Encounter (Signed)
Patient has lost his insurance.  He is working on applying for [employer]. He can afford the methotrexate, but not Humira.  He is advised we will reach out to the rep and try and get a sample for him next week.  He is provided the phone number to the Humira assistance program [phone].  He is advised that once I hear back about a sample we will contact him to pick is up next week.  He thanked me for the help.

## 2018-09-16 NOTE — Telephone Encounter (Signed)
Received fax from [facility] Assist - for Humira Pt Assistance paperwork.  Completed the provider portion and placed on Dr. Doyne Keel desk for him to sign when he returns next week. When the samples we have requested arrive we will need to contact pt to pick up the samples AND the Patient portion of the Patient Assistance paperwork for him to complete and return to [place].  Once rec'd, we can fax complete application with provider and pt information back to [employer] at [phone].

## 2018-09-17 ENCOUNTER — Telehealth: Payer: Self-pay

## 2018-09-17 NOTE — Telephone Encounter (Signed)
Called and spoke to pt.  He will bring tax returns or pay stubs which need to be included with his patient assistance application.  He will come in the morning 7-23 to pick up samples and complete his portions of paperwork.

## 2018-09-17 NOTE — Telephone Encounter (Signed)
Called and LM for pt that his Humira samples we requested have arrived.  He can pick those up at our office (in Side A fridge) along with his portion of the Patient Assistance Paperwork for Humira (Jan's in basket).

## 2018-09-17 NOTE — Telephone Encounter (Signed)
Pt returned call, he stated that he will come tomorrow. He lives one hour away so he wants to know if there is any specific information or document that he needs to bring for the patient assistance program or if it just his signature. Pls call him and let him a detailed message.

## 2018-09-18 NOTE — Telephone Encounter (Signed)
Patient came to office this morning and picked up samples of Humira and completed his portion of the Pt Assistance Paperwork.  Waiting on signature by Dr. Havery Moros to send to [facility].

## 2018-09-18 NOTE — Telephone Encounter (Signed)
Faxed completed application at 49:97DK.  Per Julieanne Cotton, CMA, 1 additional box of Humira samples has been requested and should arrive next week.

## 2018-09-22 NOTE — Telephone Encounter (Signed)
Called and LM for pt that the other box of Humira samples came in and he can pick up at his convenience.

## 2018-09-23 NOTE — Telephone Encounter (Signed)
Wonderful news thanks for your help with this Jan

## 2018-09-23 NOTE — Telephone Encounter (Signed)
Patient APPROVED for patient assistance program for Humira  With [employer] through 09-23-2019. [phone].

## 2018-11-22 ENCOUNTER — Other Ambulatory Visit: Payer: Self-pay | Admitting: Gastroenterology

## 2019-06-23 ENCOUNTER — Other Ambulatory Visit: Payer: Self-pay | Admitting: Gastroenterology

## 2019-07-29 ENCOUNTER — Other Ambulatory Visit: Payer: Self-pay

## 2019-07-29 ENCOUNTER — Encounter: Payer: Self-pay | Admitting: Gastroenterology

## 2019-07-29 ENCOUNTER — Other Ambulatory Visit (INDEPENDENT_AMBULATORY_CARE_PROVIDER_SITE_OTHER): Payer: Medicaid - Out of State

## 2019-07-29 ENCOUNTER — Ambulatory Visit (INDEPENDENT_AMBULATORY_CARE_PROVIDER_SITE_OTHER): Payer: Medicaid - Out of State | Admitting: Gastroenterology

## 2019-07-29 VITALS — BP 118/80 | HR 81 | Ht 72.0 in | Wt 238.0 lb

## 2019-07-29 DIAGNOSIS — K50819 Crohn's disease of both small and large intestine with unspecified complications: Secondary | ICD-10-CM

## 2019-07-29 DIAGNOSIS — Z79899 Other long term (current) drug therapy: Secondary | ICD-10-CM

## 2019-07-29 DIAGNOSIS — E559 Vitamin D deficiency, unspecified: Secondary | ICD-10-CM

## 2019-07-29 DIAGNOSIS — K50818 Crohn's disease of both small and large intestine with other complication: Secondary | ICD-10-CM

## 2019-07-29 LAB — COMPREHENSIVE METABOLIC PANEL
ALT: 45 U/L (ref 0–53)
AST: 31 U/L (ref 0–37)
Albumin: 4.4 g/dL (ref 3.5–5.2)
Alkaline Phosphatase: 59 U/L (ref 39–117)
BUN: 13 mg/dL (ref 6–23)
CO2: 28 mEq/L (ref 19–32)
Calcium: 9.4 mg/dL (ref 8.4–10.5)
Chloride: 105 mEq/L (ref 96–112)
Creatinine, Ser: 0.91 mg/dL (ref 0.40–1.50)
GFR: 113.44 mL/min (ref >60.00)
Glucose, Bld: 97 mg/dL (ref 70–99)
Potassium: 4.1 mEq/L (ref 3.5–5.1)
Sodium: 138 mEq/L (ref 135–145)
Total Bilirubin: 0.8 mg/dL (ref 0.2–1.2)
Total Protein: 8 g/dL (ref 6.0–8.3)

## 2019-07-29 LAB — CBC WITH DIFFERENTIAL/PLATELET
Basophils Absolute: 0 10*3/uL (ref 0.0–0.1)
Basophils Relative: 0.5 % (ref 0.0–3.0)
Eosinophils Absolute: 0.2 10*3/uL (ref 0.0–0.7)
Eosinophils Relative: 2.9 % (ref 0.0–5.0)
HCT: 42.5 % (ref 39.0–52.0)
Hemoglobin: 14.5 g/dL (ref 13.0–17.0)
Lymphocytes Relative: 28.8 % (ref 12.0–46.0)
Lymphs Abs: 1.8 10*3/uL (ref 0.7–4.0)
MCHC: 34.2 g/dL (ref 30.0–36.0)
MCV: 85.9 fl (ref 78.0–100.0)
Monocytes Absolute: 0.6 10*3/uL (ref 0.1–1.0)
Monocytes Relative: 10 % (ref 3.0–12.0)
Neutro Abs: 3.6 10*3/uL (ref 1.4–7.7)
Neutrophils Relative %: 57.8 % (ref 43.0–77.0)
Platelets: 255 10*3/uL (ref 150.0–400.0)
RBC: 4.94 Mil/uL (ref 4.22–5.81)
RDW: 13.8 % (ref 11.5–15.5)
WBC: 6.3 10*3/uL (ref 4.0–10.5)

## 2019-07-29 LAB — VITAMIN D 25 HYDROXY (VIT D DEFICIENCY, FRACTURES): VITD: 24.12 ng/mL — ABNORMAL LOW (ref 30.00–100.00)

## 2019-07-29 MED ORDER — FOLIC ACID 1 MG PO TABS: 1.0000 mg | ORAL_TABLET | Freq: Every day | ORAL | 3 refills | Status: DC

## 2019-07-29 NOTE — Patient Instructions (Signed)
If you are age 37 or older, your body mass index should be between 23-30. Your Body mass index is 32.28 kg/m. If this is out of the aforementioned range listed, please consider follow up with your Primary Care Provider.  If you are age 52 or younger, your body mass index should be between 19-25. Your Body mass index is 32.28 kg/m. If this is out of the aformentioned range listed, please consider follow up with your Primary Care Provider.    Please go to the lab in the basement of our building to have lab work done as you leave today. Hit "B" for basement when you get on the elevator.  When the doors open the lab is on your left.  We will call you with the results. Thank you.  We have sent the following medications to your pharmacy for you to pick up at your convenience: Folic acid: Take EVERY day  You can go to the [facility] to get your Covid vaccine. Appointments are preferred but not required. They are open Monday through Friday, 7:00am to 7:00pm. You can call [phone] to get more information or to schedule an appointment at multiple locations.   We will reach out to [facility] to try and determine which pneumonia vaccine you have received.   Please follow up in 6 months.  Thank you for entrusting me with your care and for choosing [facility], Dr. Harmon Cellar

## 2019-07-29 NOTE — Progress Notes (Signed)
Per [place], patient should be taking 5000 IUs of Vitamin d daily

## 2019-07-29 NOTE — Progress Notes (Signed)
HPI :  Crohn's history: Ileocolonic Crohn's disease diagnosed in 2015. He presented initially with spontaneous abscess which required drainage. He was followed in [place], [place] maintained on Imuran 1108m daily for 3 years or so, and then [facility] another [facility]ation with multiple retroperitoneal abscess in Jan 2018. He was treated with bowel rest and TPN and antibiotics, IR was able to drain one of the abscesses at the time. [facility] after his initial hospitalization, and he declined surgery at the time as he was feeling better. Unfortunately he failed conservative managemenet and had recurrence of abscess formation, [facility].   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   SINCE LAST VISIT:  39year old male here for follow-up visit for Crohn's disease.  He has not been seen in over a year, has missed prior appointment.  He has been maintained on Humira 40 mg every other week and methotrexate 15 mg/week orally.  He has been on a regimen since his surgery in 2019.  He was previously on Imuran at the time when he had a perforation and was placed on methotrexate over Imuran due to lymphoma risk in young males on thiopurines.  Recall that about a year and a half ago he had been off the Humira and methotrexate for 2 months or so when he had an insurance issue, but he states he has been compliant with both since his last visit.  He states he ran out of the folic acid and never had it refilled so he has been off of that.  He tolerates the medications quite well.  He has been feeling really well.  Has 1 bowel movement per day which is formed.  He  denies any blood in his stools.  He denies any abdominal pains.  He has regained weight.  He denies any NSAIDs.  He is eating well.  He denies any tobacco use.  No NSAID use.  He has not had the Covid vaccine or flu shot this past year.  [facility]  He previously was vitamin D deficient however has not repleted but has not been on maintenance, currently only on multivitamin.  Generally, since his surgery he is quite pleased with how he has been doing.   IBD Health Care Maintenance: Annual Flu Vaccine- 2019 Pneumococcal Vaccine: -[facility] negative 04/12/16, QG negative on 05/29/18 Vitamin Dscreening - Date: deficient - level of 17.55 on 05/29/18 Last Colonoscopy1/03/2017 - healthy anastomosis, normal ileum, faint erythema in rectum - "chronic active colitis" on path   Past Medical History:  Diagnosis Date  . [place] ([place]   . Vitamin D deficiency      Past Surgical History:  Procedure Laterality Date  . bowel abscess from crohns     . IR GENERIC HISTORICAL  04/05/2016   IR RADIOLOGIST EVAL & MGMT 04/05/2016 JSandi Mariscal MD [facility] INTERV RAD  . LAPAROSCOPIC ILEOCECECTOMY  05/14/2016   with debridement of chronic abscess cavity asnd takedown of fistulas   Family History  Problem Relation Age of Onset  . Stomach cancer Neg Hx   . Colon cancer Neg Hx  Social History   Tobacco Use  . Smoking status: Never Smoker  . Smokeless tobacco: Never Used  Substance Use Topics  . Alcohol use: No  . Drug use: No   Current Outpatient Medications  Medication Sig Dispense Refill  . Adalimumab (HUMIRA PEN) 40 MG/0.8ML PNKT 40 mg by Subtenons route every 14 (fourteen) days. 2 each 2  . methotrexate (RHEUMATREX) 2.5 MG tablet TAKE 6 TABLETS BY MOUTH ONE TIME PER WEEK. Please keep June appt for further refills. 24 tablet 1   Current Facility-Administered Medications  Medication Dose Route  Frequency Provider Last Rate Last Admin  . 0.9 %  sodium chloride infusion  500 mL Intravenous Continuous Finnbar Cedillos, Carlota Raspberry, MD       No Known Allergies   Review of Systems: All systems reviewed and negative except where noted in HPI.   Lab Results  Component Value Date   WBC 6.8 05/29/2018   HGB 15.0 05/29/2018   HCT 43.7 05/29/2018   MCV 84.2 05/29/2018   PLT 256.0 05/29/2018    Lab Results  Component Value Date   CREATININE 1.00 05/29/2018   BUN 12 05/29/2018   NA 139 05/29/2018   K 4.0 05/29/2018   CL 105 05/29/2018   CO2 27 05/29/2018    Lab Results  Component Value Date   ALT 33 05/29/2018   AST 21 05/29/2018   ALKPHOS 58 05/29/2018   BILITOT 0.6 05/29/2018     Physical Exam: BP 118/80   Pulse 81   Ht 6' (1.829 m)   Wt 238 lb (108 kg)   BMI 32.28 kg/m  Constitutional: Pleasant,well-developed, male in no acute distress. Abdominal: Soft, nondistended, nontender.  There are no masses palpable. No hepatomegaly. Extremities: no edema Lymphadenopathy: No cervical adenopathy noted. Neurological: Alert and oriented to person place and time. Skin: Skin is warm and dry. No rashes noted. Psychiatric: Normal mood and affect. Behavior is normal.   ASSESSMENT AND PLAN: 39 year old male here for reassessment of following:  Crohn's disease / high risk medication use / vitamin D deficiency -history of ileocolonic Crohn's disease complicated by perforated complications with fistula and abscess leading to surgery in 2018.  At that time he was on Imuran, treated postoperatively with Humira and methotrexate, had been off thiopurine's due to lymphoma risk in a young male.  Since his surgery he has been doing really well.  His follow-up colonoscopy did not show any significant inflammatory changes.  He apparently had run out of folic acid and was not aware of that until now, so we will refill that for him today, counseled needs takes that every day if he is taking  methotrexate.  We discussed long-term regimen, the risks of his current regimen and if we should continue this or change it at all.  I counseled him that he needs to be seen every 6 months for this issue and be compliant with lab draw.   He is overdue for labs at this time recommend the following: CBC, CMET, vitamin D, quantiferon gold.  He otherwise warrants some objective evaluation for active inflammation in his bowel, we discussed colonoscopy versus imaging with MRI or CT versus fecal calprotectin.  After discussion of these options he wants to start with fecal calprotectin initially given he is feeling well which is reasonable.  If this is elevated we may pursue MRE versus colonoscopy.  Otherwise regarding his long-term regimen, I outlined recent guideline recommendations with him, methotrexate would be much more effective with him given subcu  or IM, however he is tolerating quite well orally.  We also discussed long-term risks of methotrexate and if and when we should stop this.  I do think he should stay on Humira indefinitely unless it stops working or he has side effects from it.  He is agreeable with this and will get labs back first prior to making decision about his methotrexate use.  Otherwise we need to clarify which Pneumovax he had and if he is in need of another one we will give that to him.  I also recommend highly that he get the Covid vaccine as he is immunosuppressed and can get quite sick if he gets Covid.  He was agreeable to get the Covid vaccine and our staff gave him instructions on how to get that.  I need to see him at least every 6 months with labs and will get in touch with him once I have the results of his recent labs back to discuss recommendations on his exam.  He agreed, and will take folic acid as long as he is on methotrexate.  [place] Cellar, MD [facility]

## 2019-07-30 ENCOUNTER — Telehealth: Payer: Self-pay

## 2019-07-30 NOTE — Telephone Encounter (Signed)
Patient indicated he had a pneumonia vaccine at [facility] but he was not sure which one he received the PCV13 or the PPSV23.  I requested records from [facility] but they replied that they have no record of him having any vaccines at a [facility] facility. I called the patient and left a message to call me back to let me know where else he may have gotten it so we could try to get documentation.

## 2019-07-30 NOTE — Telephone Encounter (Signed)
Okay thanks Jan

## 2019-07-31 LAB — QUANTIFERON-TB GOLD PLUS
Mitogen-NIL: >10 IU/mL
NIL: 0.03 IU/mL
QuantiFERON-TB Gold Plus: NEGATIVE
TB1-NIL: 0 IU/mL
TB2-NIL: 0 IU/mL

## 2019-08-06 ENCOUNTER — Telehealth: Payer: Self-pay | Admitting: Gastroenterology

## 2019-08-06 NOTE — Telephone Encounter (Signed)
Patient lives out of town and concerned about getting the specimen back to [place] in time.  I offered to send an order to [place] or [place].  He declined he will bring the specimen to [place].  He will call back if he changes his mind and wants to have it sent to an alternate lab

## 2019-09-15 ENCOUNTER — Other Ambulatory Visit: Payer: Self-pay

## 2019-09-15 ENCOUNTER — Other Ambulatory Visit: Payer: Self-pay | Admitting: Gastroenterology

## 2019-09-15 ENCOUNTER — Telehealth: Payer: Self-pay | Admitting: Gastroenterology

## 2019-09-15 NOTE — Telephone Encounter (Signed)
Called and spoke to pt.  Refill for methotrexate sent to pharmacy.  I reminded patient that he needs to complete fecal calprotectin  lab for Dr. Havery Moros.  He said he was unable to return the sample last month because something came up with his son.  He indicated he will come back to [place] next week to get a collection device and will return the sample while he is in town. I confirmed that we did received the paperwork he faxed for Humira patient assistance.  The provider sections were completed and signed and the application was faxed to [facility] Patient Assistance at [phone]. Patient requested a sample of Humira until the pt assistance comes through.  Sample has been requested from manufacturer rep.  We should hopefully have it by next week when pt is in town and coming to the lab for fecal calprotectin. Dr. Havery Moros, is there anything else the pt should have while he is in town? Thank you.

## 2019-09-15 NOTE — Telephone Encounter (Signed)
No I think that is all. Thanks for your help!

## 2019-09-16 NOTE — Telephone Encounter (Signed)
[employer] Patient assistance approved for Humira through 09-15-20. [phone].

## 2019-10-02 ENCOUNTER — Other Ambulatory Visit: Payer: Managed Care, Other (non HMO)

## 2019-10-02 DIAGNOSIS — K50819 Crohn's disease of both small and large intestine with unspecified complications: Secondary | ICD-10-CM

## 2019-10-02 DIAGNOSIS — Z79899 Other long term (current) drug therapy: Secondary | ICD-10-CM

## 2019-10-06 LAB — CALPROTECTIN, FECAL: Calprotectin, Fecal: 64 ug/g (ref 0–120)

## 2019-10-07 ENCOUNTER — Telehealth: Payer: Self-pay | Admitting: Gastroenterology

## 2019-10-07 NOTE — Telephone Encounter (Signed)
Spoke with patient, advised that Dr. Havery Moros is out of the office this week and once he reviews his lab results we will give him a call.

## 2019-10-08 ENCOUNTER — Other Ambulatory Visit: Payer: Self-pay

## 2019-10-08 DIAGNOSIS — Z79899 Other long term (current) drug therapy: Secondary | ICD-10-CM

## 2019-10-08 DIAGNOSIS — K50819 Crohn's disease of both small and large intestine with unspecified complications: Secondary | ICD-10-CM

## 2019-10-08 NOTE — Telephone Encounter (Signed)
Patient is returning your call.  

## 2019-10-08 NOTE — Telephone Encounter (Signed)
Spoke with patient, see result note for more information

## 2019-12-08 ENCOUNTER — Telehealth: Payer: Self-pay

## 2019-12-08 NOTE — Telephone Encounter (Signed)
Left detailed message reminding patient that he is due for a repeat fecal calprotectin. Advised that he can go by the lab at his convenience between 7:30 AM -5PM, Monday through Friday. Advised patient to give me a call if he had any questions.

## 2019-12-08 NOTE — Telephone Encounter (Signed)
-----   Message from Marlon Pel, RN sent at 12/08/2019 11:51 AM EDT ----- Regarding: FW: Labs  ----- Message ----- From: Yevette Edwards, RN Sent: 12/08/2019 To: Marlon Pel, RN Subject: Labs                                           Fecal calprotectin, order in epic

## 2019-12-18 ENCOUNTER — Telehealth: Payer: Self-pay | Admitting: Gastroenterology

## 2019-12-18 ENCOUNTER — Other Ambulatory Visit: Payer: Self-pay | Admitting: Gastroenterology

## 2019-12-18 NOTE — Telephone Encounter (Signed)
Refill sent to pharmacy.   

## 2019-12-18 NOTE — Telephone Encounter (Signed)
Pt is requesting a refill on his medication methotrexate

## 2019-12-21 ENCOUNTER — Other Ambulatory Visit: Payer: Managed Care, Other (non HMO)

## 2020-02-19 ENCOUNTER — Other Ambulatory Visit: Payer: Self-pay

## 2020-02-19 DIAGNOSIS — N390 Urinary tract infection, site not specified: Secondary | ICD-10-CM | POA: Insufficient documentation

## 2020-02-19 DIAGNOSIS — R1031 Right lower quadrant pain: Secondary | ICD-10-CM | POA: Diagnosis present

## 2020-02-20 ENCOUNTER — Emergency Department (HOSPITAL_COMMUNITY): Payer: BC Managed Care – PPO

## 2020-02-20 ENCOUNTER — Encounter (HOSPITAL_COMMUNITY): Payer: Self-pay

## 2020-02-20 ENCOUNTER — Emergency Department (HOSPITAL_COMMUNITY)
Admission: EM | Admit: 2020-02-20 | Discharge: 2020-02-20 | Disposition: A | Discharge status: Home / Self Care | Payer: BC Managed Care – PPO | Attending: Emergency Medicine | Admitting: Emergency Medicine

## 2020-02-20 DIAGNOSIS — N390 Urinary tract infection, site not specified: Secondary | ICD-10-CM

## 2020-02-20 DIAGNOSIS — R1031 Right lower quadrant pain: Secondary | ICD-10-CM

## 2020-02-20 LAB — URINALYSIS, ROUTINE W REFLEX MICROSCOPIC
Bilirubin Urine: NEGATIVE
Glucose, UA: NEGATIVE mg/dL
Ketones, ur: 5 mg/dL — AB
Nitrite: NEGATIVE
Protein, ur: 100 mg/dL — AB
Specific Gravity, Urine: 1.04 — ABNORMAL HIGH (ref 1.005–1.030)
pH: 6 (ref 5.0–8.0)

## 2020-02-20 LAB — CBC WITH DIFFERENTIAL/PLATELET
Abs Immature Granulocytes: 0.08 10*3/uL — ABNORMAL HIGH (ref 0.00–0.07)
Basophils Absolute: 0.1 10*3/uL (ref 0.0–0.1)
Basophils Relative: 0 %
Eosinophils Absolute: 0.1 10*3/uL (ref 0.0–0.5)
Eosinophils Relative: 0 %
HCT: 44.2 % (ref 39.0–52.0)
Hemoglobin: 14.8 g/dL (ref 13.0–17.0)
Immature Granulocytes: 1 %
Lymphocytes Relative: 12 %
Lymphs Abs: 1.9 10*3/uL (ref 0.7–4.0)
MCH: 29.7 pg (ref 26.0–34.0)
MCHC: 33.5 g/dL (ref 30.0–36.0)
MCV: 88.6 fL (ref 80.0–100.0)
Monocytes Absolute: 1.3 10*3/uL — ABNORMAL HIGH (ref 0.1–1.0)
Monocytes Relative: 8 %
Neutro Abs: 12.3 10*3/uL — ABNORMAL HIGH (ref 1.7–7.7)
Neutrophils Relative %: 79 %
Platelets: 267 10*3/uL (ref 150–400)
RBC: 4.99 MIL/uL (ref 4.22–5.81)
RDW: 12.9 % (ref 11.5–15.5)
WBC: 15.8 10*3/uL — ABNORMAL HIGH (ref 4.0–10.5)
nRBC: 0 % (ref 0.0–0.2)

## 2020-02-20 LAB — COMPREHENSIVE METABOLIC PANEL
ALT: 25 U/L (ref 0–44)
AST: 25 U/L (ref 15–41)
Albumin: 4.1 g/dL (ref 3.5–5.0)
Alkaline Phosphatase: 52 U/L (ref 38–126)
Anion gap: 6 (ref 5–15)
BUN: 11 mg/dL (ref 6–20)
CO2: 26 mmol/L (ref 22–32)
Calcium: 9.5 mg/dL (ref 8.9–10.3)
Chloride: 105 mmol/L (ref 98–111)
Creatinine, Ser: 1.07 mg/dL (ref 0.61–1.24)
GFR, Estimated: >60 mL/min (ref >60)
Glucose, Bld: 117 mg/dL — ABNORMAL HIGH (ref 70–99)
Potassium: 4.3 mmol/L (ref 3.5–5.1)
Sodium: 137 mmol/L (ref 135–145)
Total Bilirubin: 0.8 mg/dL (ref 0.3–1.2)
Total Protein: 8.6 g/dL — ABNORMAL HIGH (ref 6.5–8.1)

## 2020-02-20 LAB — LIPASE, BLOOD: Lipase: 23 U/L (ref 11–51)

## 2020-02-20 MED ORDER — CIPROFLOXACIN HCL 500 MG PO TABS: 500.0000 mg | ORAL_TABLET | Freq: Two times a day (BID) | ORAL | 0 refills | Status: DC

## 2020-02-20 MED ORDER — CIPROFLOXACIN HCL 500 MG PO TABS
500.0000 mg | ORAL_TABLET | Freq: Once | ORAL | Status: AC
  Administered 2020-02-20: 04:00:00 500 mg via ORAL
  Filled 2020-02-20: qty 1

## 2020-02-20 MED ORDER — PREDNISONE 20 MG PO TABS
40.0000 mg | ORAL_TABLET | Freq: Once | ORAL | Status: AC
  Administered 2020-02-20: 04:00:00 40 mg via ORAL
  Filled 2020-02-20: qty 2

## 2020-02-20 MED ORDER — PREDNISONE 10 MG (21) PO TBPK: ORAL_TABLET | Freq: Every day | ORAL | 0 refills | Status: DC

## 2020-02-20 MED ORDER — IOHEXOL 300 MG/ML  SOLN
100.0000 mL | Freq: Once | INTRAMUSCULAR | Status: AC | PRN
  Administered 2020-02-20: 02:00:00 100 mL via INTRAVENOUS

## 2020-02-20 MED ORDER — METRONIDAZOLE 500 MG PO TABS: 500.0000 mg | ORAL_TABLET | Freq: Two times a day (BID) | ORAL | 0 refills | Status: DC

## 2020-02-20 MED ORDER — HYDROCODONE-ACETAMINOPHEN 5-325 MG PO TABS: 1.0000 | ORAL_TABLET | Freq: Four times a day (QID) | ORAL | 0 refills | Status: DC | PRN

## 2020-02-20 MED ORDER — METRONIDAZOLE 500 MG PO TABS
500.0000 mg | ORAL_TABLET | Freq: Once | ORAL | Status: AC
  Administered 2020-02-20: 04:00:00 500 mg via ORAL
  Filled 2020-02-20: qty 1

## 2020-02-20 MED ORDER — KETOROLAC TROMETHAMINE 15 MG/ML IJ SOLN
15.0000 mg | Freq: Once | INTRAMUSCULAR | Status: AC
  Administered 2020-02-20: 01:00:00 15 mg via INTRAVENOUS
  Filled 2020-02-20: qty 1

## 2020-02-20 MED ORDER — ONDANSETRON HCL 4 MG/2ML IJ SOLN: 4.0000 mg | Freq: Once | INTRAMUSCULAR | Status: DC | PRN

## 2020-02-20 NOTE — Discharge Instructions (Addendum)
Take antibiotics as prescribed.  Take entire course, even if symptoms improve. Take prednisone as prescribed. Use Tylenol as needed for mild to moderate pain.  Use Norco as needed for severe breakthrough pain.  Have caution, this make you tired or groggy.  Do not drive or operate heavy machinery while taking this medicine.  Follow-up closely with your GI doctor for recheck. Return to the emergency room with any worsening symptoms.  Return with fevers, vomiting, severe worsening pain, persistent blood in your stool, or any new, worsening, or concerning symptoms

## 2020-02-20 NOTE — ED Provider Notes (Signed)
[facility] DEPT Provider Note   CSN: [id number] Arrival date & time: 02/19/20  2352     History Chief Complaint  Patient presents with  . Abdominal Pain    Larry [name] is a 39 y.o. male presenting for evaluation of abdominal pain.  Patient states for the past 3 days, he has had right lower quadrant abdominal pain.  Gradually worsening.  He reports intermittent chills, no known fevers.  No nausea or vomiting.  No change in bowel movements.  Patient reports urine is darker color, but no dysuria or hematuria.  Urination and bowel movements does not change his pain.  Movement helps with the pain, but is not resolve it.  It is not worse with p.o. intake.  He reports a history of Crohn's, states this feels similar to his last flare, which is in 2018.  At that time, he had significant infection requiring colectomy.  Patient is followed by Dr. Havery Moros with [facility], currently on methotrexate and Humira.  He reports no other medical problems, takes no other medications daily.  HPI     Past Medical History:  Diagnosis Date  . Crohn's disease ([place])   . Vitamin D deficiency     Patient Active Problem List   Diagnosis Date Noted  . Abscess   . Crohn's colitis, unspecified complication ([place]) 97/35/3299  . 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 Thrombocytosis 04/22/2016  . Crohn's disease of ileum with abscess ([place])   . Crohn's colitis ([place]) 03/16/2016  . Microcytic anemia 03/16/2016    Past Surgical History:  Procedure Laterality Date  . bowel abscess from crohns     . IR GENERIC HISTORICAL  04/05/2016   IR RADIOLOGIST EVAL & MGMT 04/05/2016 Sandi Mariscal, MD [facility] INTERV RAD  . LAPAROSCOPIC ILEOCECECTOMY  05/14/2016   with debridement of chronic abscess cavity asnd takedown of fistulas       Family History  Problem Relation Age of Onset  . Stomach cancer Neg Hx   . Colon cancer Neg Hx     Social History   Tobacco Use  .  Smoking status: Never Smoker  . Smokeless tobacco: Never Used  Vaping Use  . Vaping Use: Never used  Substance Use Topics  . Alcohol use: No  . Drug use: No    Home Medications Prior to Admission medications   Medication Sig Start Date End Date Taking? Authorizing Provider  Adalimumab (HUMIRA PEN) 40 MG/0.8ML PNKT 40 mg by Subtenons route every 14 (fourteen) days. 04/29/18   Armbruster, Carlota Raspberry, MD  Cholecalciferol (VITAMIN D) 125 MCG (5000 UT) CAPS Take 5,000 Units by mouth daily. 07/29/19   Armbruster, Carlota Raspberry, MD  ciprofloxacin (CIPRO) 500 MG tablet Take 1 tablet (500 mg total) by mouth every 12 (twelve) hours. 02/20/20   Alima Naser, PA-C  folic acid (FOLVITE) 1 MG tablet Take 1 tablet (1 mg total) by mouth daily. 07/29/19   Armbruster, Carlota Raspberry, MD  HYDROcodone-acetaminophen (NORCO/VICODIN) 5-325 MG tablet Take 1 tablet by mouth every 6 (six) hours as needed. 02/20/20   Erik Burkett, PA-C  methotrexate (RHEUMATREX) 2.5 MG tablet TAKE 6 TABLETS BY MOUTH ONE TIME PER WEEK. 12/18/19   Armbruster, Carlota Raspberry, MD  metroNIDAZOLE (FLAGYL) 500 MG tablet Take 1 tablet (500 mg total) by mouth 2 (two) times daily. 02/20/20   Jarita Raval, PA-C  predniSONE (STERAPRED UNI-PAK 21 TAB) 10 MG (21) TBPK tablet Take by mouth daily. 6 tabs po qd x 2 days, 5  tabs x2 days, 4 tabs x2 days, 3 tabs x2 days, 2 tabs x2 days, 1 tab x 2 days 02/20/20   Sharronda Schweers, PA-C    Allergies    Patient has no known allergies.  Review of Systems   Review of Systems  Constitutional: Positive for chills.  Gastrointestinal: Positive for abdominal pain.  All other systems reviewed and are negative.   Physical Exam Updated Vital Signs BP 123/82   Pulse 85   Temp 98.7 F (37.1 C) (Oral)   Resp (!) 24   Ht 6' 1"  (1.854 m)   Wt 104.3 kg   SpO2 98%   BMI 30.34 kg/m   Physical Exam Vitals and nursing note reviewed.  Constitutional:      General: He is not in acute distress.    Appearance: He is  well-developed and well-nourished.     Comments: Resting in the bed in NAD  HENT:     Head: Normocephalic and atraumatic.  Eyes:     Extraocular Movements: EOM normal.     Conjunctiva/sclera: Conjunctivae normal.     Pupils: Pupils are equal, round, and reactive to light.  Cardiovascular:     Rate and Rhythm: Normal rate and regular rhythm.     Pulses: Normal pulses and intact distal pulses.  Pulmonary:     Effort: Pulmonary effort is normal. No respiratory distress.     Breath sounds: Normal breath sounds. No wheezing.  Abdominal:     General: There is no distension.     Palpations: Abdomen is soft. There is no mass.     Tenderness: There is abdominal tenderness in the right lower quadrant. There is no guarding or rebound.     Comments: Focal ttp of the RLQ.  No rigidity, guarding, distention.  Negative rebound.  No peritonitis.  No CVA tenderness.  Musculoskeletal:        General: Normal range of motion.     Cervical back: Normal range of motion and neck supple.  Skin:    General: Skin is warm and dry.     Capillary Refill: Capillary refill takes less than 2 seconds.  Neurological:     Mental Status: He is alert and oriented to person, place, and time.  Psychiatric:        Mood and Affect: Mood and affect normal.     ED Results / Procedures / Treatments   Labs (all labs ordered are listed, but only abnormal results are displayed) Labs Reviewed  COMPREHENSIVE METABOLIC PANEL - Abnormal; Notable for the following components:      Result Value   Glucose, Bld 117 (*)    Total Protein 8.6 (*)    All other components within normal limits  URINALYSIS, ROUTINE W REFLEX MICROSCOPIC - Abnormal; Notable for the following components:   APPearance CLOUDY (*)    Specific Gravity, Urine 1.040 (*)    Hgb urine dipstick SMALL (*)    Ketones, ur 5 (*)    Protein, ur 100 (*)    Leukocytes,Ua LARGE (*)    Bacteria, UA RARE (*)    All other components within normal limits  CBC WITH  DIFFERENTIAL/PLATELET - Abnormal; Notable for the following components:   WBC 15.8 (*)    Neutro Abs 12.3 (*)    Monocytes Absolute 1.3 (*)    Abs Immature Granulocytes 0.08 (*)    All other components within normal limits  LIPASE, BLOOD    EKG None  Radiology CT ABDOMEN PELVIS W CONTRAST  Result Date: 02/20/2020 CLINICAL DATA:  Initial evaluation for acute right lower quadrant pain. History of Crohn's disease with prior ileocecectomy. EXAM: CT ABDOMEN AND PELVIS WITH CONTRAST TECHNIQUE: Multidetector CT imaging of the abdomen and pelvis was performed using the standard protocol following bolus administration of intravenous contrast. CONTRAST:  160m OMNIPAQUE IOHEXOL 300 MG/ML  SOLN COMPARISON:  Prior CT from 04/22/2016. FINDINGS: Lower chest: Mild scattered subsegmental atelectatic changes seen dependently within the visualized lung bases. Visualized lungs are otherwise clear. Hepatobiliary: Subcentimeter hypodensity noted within the left hepatic lobe (series 2, image 19), nonspecific, but of doubtful significance. Liver otherwise demonstrates a normal contrast enhanced appearance. Gallbladder within normal limits. No biliary dilatation. Pancreas: Pancreas within normal limits. Spleen: Spleen within normal limits. Adrenals/Urinary Tract: Adrenal glands are normal. Kidneys equal size with symmetric enhancement. No nephrolithiasis, hydronephrosis, or focal enhancing renal mass. No visible hydroureter. Bladder largely decompressed without acute finding. Stomach/Bowel: Stomach within normal limits. No evidence for bowel obstruction. Postoperative changes from priorileocecectomy seen at the right lower quadrant. Appendix not visualized, and may be absent. Minimal residual stranding along the right pericolic gutter and lateral aspect of the right psoas muscle, markedly improved as compared to previous exam, suspected reflect scarring from prior surgery and/or intervention. No acute inflammatory changes  seen elsewhere about the bowels. Vascular/Lymphatic: Normal intravascular enhancement seen throughout the intra-abdominal aorta. Mesenteric vessels patent proximally. No adenopathy. Reproductive: Prostate seminal vesicles within normal limits. Other: No free air or fluid. Musculoskeletal: Small focus of emphysema present within the subcutaneous fat of the right ventral abdominal wall, nonspecific, but could reflect an injection site. No acute osseous finding. No discrete or worrisome osseous lesions. Degenerative disc bulging with annular calcification noted at L5-S1. IMPRESSION: 1. No CT evidence for acute intra-abdominal or pelvic process. 2. Postoperative changes from prior ileocecectomy at the right lower quadrant. Minimal residual stranding along the adjacent right pericolic gutter and lateral aspect of the right psoas muscle suspected to reflect scarring from prior inflammation and/or surgery. 3. Small focus of emphysema within the subcutaneous fat of the right ventral abdominal wall, nonspecific, but could reflect an injection site. Correlation with physical exam recommended. Electronically Signed   By: BJeannine BogaM.D.   On: 02/20/2020 03:04    Procedures Procedures (including critical care time)  Medications Ordered in ED Medications  ondansetron (ZOFRAN) injection 4 mg (has no administration in time range)  ciprofloxacin (CIPRO) tablet 500 mg (has no administration in time range)  metroNIDAZOLE (FLAGYL) tablet 500 mg (has no administration in time range)  predniSONE (DELTASONE) tablet 40 mg (has no administration in time range)  ketorolac (TORADOL) 15 MG/ML injection 15 mg (15 mg Intravenous Given 02/20/20 0107)  iohexol (OMNIPAQUE) 300 MG/ML solution 100 mL (100 mLs Intravenous Contrast Given 02/20/20 0220)    ED Course  I have reviewed the triage vital signs and the nursing notes.  Pertinent labs & imaging results that were available during my care of the patient were reviewed  by me and considered in my medical decision making (see chart for details).    MDM Rules/Calculators/A&P                          Patient presenting for evaluation of abdominal pain.  On exam, he appears nontoxic.  He does have focal tenderness palpation of the right lower quadrant with associated chills.  He has a history of Crohn's, this could be a Crohn's flare.  However also consider appendicitis.  Less likely obstruction, as patient is having bowel movements, however he does have a history of significant abdominal surgical history.  Consider GI illness.  Consider kidney stone.  Will obtain labs and CT abdomen pelvis for further evaluation.  Labs interpreted by me, mild leukocytosis of 15.  Otherwise reassuring.  Urine is consistent with infection.  As patient has pain and color change of the urine, will treat for UTI.  CT abdomen pelvis without appendicitis or obvious acute findings.  However there is some mild residual stranding where patient has a surgery several years ago.  As patient states his symptoms are consistent with his previous flare, will treat as a Crohn's flare.  However as his vitals are stable and lab work is overall reassuring, I do not believe he needs admission at this time.  Will treat with antibiotics and steroids.  We will have him follow up closely with GI.  Case discussed with attending, Dr. Tomi Bamberger agrees to plan.  Discussed findings and plan with patient, he is agreeable.  Short course of pain medicine given.  Discussed importance of close follow-up with GI.  At this time, patient appears safe for discharge.  Strict return precautions given.  Patient states he understands and agrees to plan.  Final Clinical Impression(s) / ED Diagnoses Final diagnoses:  RLQ abdominal pain  Urinary tract infection without hematuria, site unspecified    Rx / DC Orders ED Discharge Orders         Ordered    ciprofloxacin (CIPRO) 500 MG tablet  Every 12 hours        02/20/20 0347     metroNIDAZOLE (FLAGYL) 500 MG tablet  2 times daily        02/20/20 0347    HYDROcodone-acetaminophen (NORCO/VICODIN) 5-325 MG tablet  Every 6 hours PRN        02/20/20 0347    predniSONE (STERAPRED UNI-PAK 21 TAB) 10 MG (21) TBPK tablet  Daily        02/20/20 0347           Franchot Heidelberg, PA-C 02/20/20 0350    Rolland Porter, MD 02/20/20 [phone]

## 2020-02-20 NOTE — ED Notes (Signed)
Per Caryl Pina NT, pt refused to put on gown

## 2020-02-20 NOTE — ED Notes (Signed)
Patient to CT.

## 2020-02-20 NOTE — ED Triage Notes (Signed)
Patient reports pain in right lower abdomen that began Thursday morning. Pt has a hx of crohn's disease and has had several surgeries in the past for it. Pt reports pain is currently 6/10.

## 2020-02-25 ENCOUNTER — Telehealth: Payer: Self-pay

## 2020-02-25 NOTE — Telephone Encounter (Addendum)
[name], Larry Raspberry, MD  Yevette Edwards, RN [place] can you call this patient next week and see how he is doing. Needs a follow up office appointment. Thanks     Called patient twice, phone does not ring, received automated message "the person you are trying to reach has a voicemail box that has not been set up yet. Will attempt again later.

## 2020-03-01 NOTE — Telephone Encounter (Signed)
Spoke with patient, he states that he does not have anymore abdominal pain, pt states that he is feeling better, he took all medications as instructed and bowel movements are okay now. Patient has been scheduled for a follow up on Monday, 03/07/2020 at 9:50 AM. Patient had no concerns at the end of the call.

## 2020-03-07 ENCOUNTER — Ambulatory Visit: Payer: BC Managed Care – PPO | Admitting: Gastroenterology

## 2020-04-06 ENCOUNTER — Other Ambulatory Visit: Payer: Self-pay | Admitting: Gastroenterology

## 2020-04-29 ENCOUNTER — Ambulatory Visit: Payer: BC Managed Care – PPO | Admitting: Gastroenterology

## 2020-06-24 ENCOUNTER — Other Ambulatory Visit (INDEPENDENT_AMBULATORY_CARE_PROVIDER_SITE_OTHER): Payer: BC Managed Care – PPO

## 2020-06-24 ENCOUNTER — Encounter: Payer: Self-pay | Admitting: Gastroenterology

## 2020-06-24 ENCOUNTER — Ambulatory Visit (INDEPENDENT_AMBULATORY_CARE_PROVIDER_SITE_OTHER): Payer: BC Managed Care – PPO | Admitting: Gastroenterology

## 2020-06-24 VITALS — BP 118/88 | HR 74 | Ht 73.0 in | Wt 239.0 lb

## 2020-06-24 DIAGNOSIS — K50819 Crohn's disease of both small and large intestine with unspecified complications: Secondary | ICD-10-CM

## 2020-06-24 DIAGNOSIS — E559 Vitamin D deficiency, unspecified: Secondary | ICD-10-CM

## 2020-06-24 DIAGNOSIS — Z79899 Other long term (current) drug therapy: Secondary | ICD-10-CM | POA: Diagnosis not present

## 2020-06-24 LAB — HEPATIC FUNCTION PANEL
ALT: 25 U/L (ref 0–53)
AST: 23 U/L (ref 0–37)
Albumin: 4.3 g/dL (ref 3.5–5.2)
Alkaline Phosphatase: 51 U/L (ref 39–117)
Bilirubin, Direct: 0.2 mg/dL (ref 0.0–0.3)
Total Bilirubin: 0.8 mg/dL (ref 0.2–1.2)
Total Protein: 8.3 g/dL (ref 6.0–8.3)

## 2020-06-24 LAB — CBC WITH DIFFERENTIAL/PLATELET
Basophils Absolute: 0.1 10*3/uL (ref 0.0–0.1)
Basophils Relative: 0.7 % (ref 0.0–3.0)
Eosinophils Absolute: 0.2 10*3/uL (ref 0.0–0.7)
Eosinophils Relative: 2.6 % (ref 0.0–5.0)
HCT: 41.4 % (ref 39.0–52.0)
Hemoglobin: 14.2 g/dL (ref 13.0–17.0)
Lymphocytes Relative: 28.6 % (ref 12.0–46.0)
Lymphs Abs: 2.1 10*3/uL (ref 0.7–4.0)
MCHC: 34.4 g/dL (ref 30.0–36.0)
MCV: 86.1 fl (ref 78.0–100.0)
Monocytes Absolute: 0.8 10*3/uL (ref 0.1–1.0)
Monocytes Relative: 11.3 % (ref 3.0–12.0)
Neutro Abs: 4.1 10*3/uL (ref 1.4–7.7)
Neutrophils Relative %: 56.8 % (ref 43.0–77.0)
Platelets: 214 10*3/uL (ref 150.0–400.0)
RBC: 4.81 Mil/uL (ref 4.22–5.81)
RDW: 14 % (ref 11.5–15.5)
WBC: 7.2 10*3/uL (ref 4.0–10.5)

## 2020-06-24 LAB — VITAMIN D 25 HYDROXY (VIT D DEFICIENCY, FRACTURES): VITD: 33.71 ng/mL (ref 30.00–100.00)

## 2020-06-24 MED ORDER — METHOTREXATE 2.5 MG PO TABS: ORAL_TABLET | ORAL | 3 refills | Status: DC

## 2020-06-24 MED ORDER — SUTAB 1479-225-188 MG PO TABS: 1.0000 | ORAL_TABLET | Freq: Once | ORAL | 0 refills | Status: AC

## 2020-06-24 NOTE — Patient Instructions (Signed)
We have sent the following medications to your pharmacy for you to pick up at your convenience:  Methotrexate  Your provider has requested that you go to the basement level for lab work before leaving today. Press "B" on the elevator. The lab is located at the first door on the left as you exit the elevator.  You have been scheduled for a colonoscopy. Please follow written instructions given to you at your visit today.  Please pick up your prep supplies at the pharmacy within the next 1-3 days. If you use inhalers (even only as needed), please bring them with you on the day of your procedure.

## 2020-06-24 NOTE — Progress Notes (Signed)
HPI :  Crohn's history: Ileocolonic Crohn's disease diagnosed in 2015. He presented initially with spontaneous abscess which required drainage. He was followed in [place], [place] maintained on Imuran 146m daily for 3 years or so, and [facility] with multiple retroperitoneal abscess in Jan 2018. He was treated with bowel rest and TPN and antibiotics, IR was able to drain one of the abscesses at the time. [facility] ADrue Flirt after his initial hospitalization, and he declined surgery at the time as he was feeling better. Unfortunately he failed conservative managemenet and had recurrence of abscess formation, [facility].   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   SINCE LAST VISIT:  40year old male here for a follow-up visit for his Crohn's disease.  I last saw him in June 2021.  He is currently taking Humira every other week and oral methotrexate.  He has been on this regimen postoperatively.  He has been doing well since I have seen him in general.  He did go to the emergency department on Christmas day for abdominal pain in association with dysuria.  He had a CT scan done which showed that his bowel looked okay and it was not thought that he had active Crohn's disease but was treated for a UTI and got better.  He denies any abdominal pains.  His bowels are regular.  He states he is compliant with his medications.  He never got the COVID-vaccine and ended up getting COVID this past January, fortunately it was mild.  [facility]  He was taking vitamin D supplementation given his history  of deficiency of this, was taking about 4000 units a day but then ran out of it and stopped, he has not resumed it.  He is taking folic acid every day.  [place]  His weight is stable, in fact he thinks he has gained some weight, currently 239 pounds.  Generally he is happy with how he is feeling at this time and has no complaints.  Since I last saw him we did a fecal calprotectin which was borderline elevated.  We had discussed doing a follow-up stool test or colonoscopy.    IBD Health Care Maintenance: Annual Flu Vaccine- UTD [facility]neumococcal Vaccine: [facility], QG negative on 05/29/18, 07/2019 Vitamin Dscreening - Date: deficient- level of 24.12 on 07/29/19 Last Colonoscopy1/03/2017 - healthy anastomosis, normal ileum, faint erythema in rectum - "chronic active colitis" on path  Fecal calprotectin 64 on 10/02/19   CT abdomen / pelvis 02/20/20 - IMPRESSION: 1. No CT evidence for acute intra-abdominal or pelvic process. 2. Postoperative changes from prior ileocecectomy at the right lower quadrant. Minimal residual stranding along the adjacent right pericolic gutter and lateral aspect of the right psoas muscle suspected to reflect scarring from prior inflammation and/or surgery. 3. Small focus of emphysema within the subcutaneous fat of the right ventral abdominal wall, nonspecific, but could reflect an injection site. Correlation with physical exam recommended.    Past Medical History:  Diagnosis Date  . [place]   . Vitamin D deficiency      Past Surgical  History:  Procedure Laterality Date  . bowel abscess from crohns     . IR GENERIC HISTORICAL  04/05/2016   IR RADIOLOGIST EVAL & MGMT 04/05/2016 Sandi Mariscal, MD [facility] INTERV RAD  . LAPAROSCOPIC ILEOCECECTOMY  05/14/2016   with debridement of chronic abscess cavity asnd takedown of fistulas   Family History  Problem Relation Age of Onset   . Stomach cancer Neg Hx   . Colon cancer Neg Hx    Social History   Tobacco Use  . Smoking status: Never Smoker  . Smokeless tobacco: Never Used  Vaping Use  . Vaping Use: Never used  Substance Use Topics  . Alcohol use: No  . Drug use: No   Current Outpatient Medications  Medication Sig Dispense Refill  . Adalimumab (HUMIRA PEN) 40 MG/0.8ML PNKT 40 mg by Subtenons route every 14 (fourteen) days. 2 each 2  . Cholecalciferol (VITAMIN D) 125 MCG (5000 UT) CAPS Take 5,000 Units by mouth daily. 30 capsule   . folic acid (FOLVITE) 1 MG tablet Take 1 tablet (1 mg total) by mouth daily. 90 tablet 3  . Sodium Sulfate-Mag Sulfate-KCl (SUTAB) [phone] MG TABS Take 1 kit by mouth once for 1 dose. MANUFACTURER CODES!! BIN: [id number] PCN: CN GROUP: [id number] MEMBER ID: [id number];[employer] AS SECONDARY INSURANCE ;NO PRIOR AUTHORIZATION 24 tablet 0  . methotrexate (RHEUMATREX) 2.5 MG tablet TAKE 6 TABLETS BY MOUTH ONE TIME PER WEEK. 24 tablet 3   No current facility-administered medications for this visit.   No Known Allergies   Review of Systems: All systems reviewed and negative except where noted in HPI.   Lab Results  Component Value Date   WBC 7.2 06/24/2020   HGB 14.2 06/24/2020   HCT 41.4 06/24/2020   MCV 86.1 06/24/2020   PLT 214.0 06/24/2020    Lab Results  Component Value Date   CREATININE 1.07 02/20/2020   BUN 11 02/20/2020   NA 137 02/20/2020   K 4.3 02/20/2020   CL 105 02/20/2020   CO2 26 02/20/2020    Lab Results  Component Value Date   ALT 25 06/24/2020   AST 23 06/24/2020   ALKPHOS 51 06/24/2020   BILITOT 0.8 06/24/2020     Physical Exam: BP 118/88   Pulse 74   Ht _0  (1.854 m)   Wt 239 lb (108.4 kg)   BMI 31.53 kg/m  Constitutional: Pleasant,well-developed, male in no acute distress. Abdominal: Soft, nondistended, nontender. There are no masses palpable.  Extremities: no edema Lymphadenopathy: No cervical adenopathy noted. Neurological: Alert  and oriented to person place and time. Skin: Skin is warm and dry. No rashes noted. Psychiatric: Normal mood and affect. Behavior is normal.   ASSESSMENT AND PLAN: 40 year old male here for reassessment of the following:  Crohn's disease High risk medication use Vitamin D deficiency  History of ileocolonic Crohn's disease with spontaneous perforation, abscess and fistula leading to surgery.  Postoperatively he has been on Humira and methotrexate and clinically doing quite well.  He previously was on thiopurines when his perforation occurred, he failed monotherapy with that and wanted to go on another regimen.  His fecal calprotectin was mildly elevated when last checked but clinically he is doing well.  CT scan last December did not show any active disease.  We discussed long-term plans.  I discussed with him that subcutaneous methotrexate is recommended in the treatment of Crohn's disease and appears to work better than oral administration however he is doing well on his present regimen  and wishes to continue oral formulation.  We discussed how long to keep him on methotrexate and at what point to consider just Humira monotherapy.  To help [place] make this decision we discussed performing a surveillance colonoscopy to assess for any active inflammation given his prior fecal calprotectin test.  I discussed risk benefits of colonoscopy and he wants to proceed.  If it looks good and he is in remission we may take off the methotrexate given he has been on combination therapy for a few years now and see how he does with Humira monotherapy.  He is agreeable to this.  Otherwise he is due for basic lab work today, will recheck his vitamin D level and QuantiFERON gold.  He has forgot to take his vitamin D recently and recommend he resume that.  He will need assistance with a ride home from his colonoscopy, gave him handouts on [facility] to help with this.  Otherwise up-to-date with vaccines, other than he  declines COVID vaccine  Plan: - CBC, LFTs, vitamin D level, quantiferon gold - continue Humira and methotrexate - colonoscopy - will consider stopping methotrexate pending colonoscopy results   Christine Cellar, MD [facility]

## 2020-06-26 LAB — QUANTIFERON-TB GOLD PLUS
Mitogen-NIL: >10 IU/mL
NIL: 0.03 IU/mL
QuantiFERON-TB Gold Plus: NEGATIVE
TB1-NIL: <0 IU/mL
TB2-NIL: <0 IU/mL

## 2020-09-06 ENCOUNTER — Encounter: Payer: BC Managed Care – PPO | Admitting: Gastroenterology

## 2020-10-03 ENCOUNTER — Telehealth: Payer: Self-pay

## 2020-10-03 NOTE — Telephone Encounter (Signed)
Received fax from [facility] for Humira. Patient Assistance for Humira is nearing conclusion and is needing to be reapplied for.  Form completed for "provider" sections and placed on Dr. Doyne Keel desk for his signature when he returns later this week. Patient will need to complete all patient sections and mail or fax to [facility]. [phone]

## 2020-10-12 NOTE — Telephone Encounter (Signed)
Completed and signed paperwork mailed to patient.

## 2020-11-03 ENCOUNTER — Encounter: Payer: Self-pay | Admitting: Gastroenterology

## 2020-11-16 ENCOUNTER — Telehealth: Payer: Self-pay

## 2020-11-16 NOTE — Telephone Encounter (Signed)
Patient asst paperwork for [employer] completed and faxed to [phone]. Called and spoke to patient.  He did have have any financial/income documentation to include as he does not work and asked that I fax it "as it".  Two pages and list of meds/allergies faxed.

## 2020-12-26 ENCOUNTER — Telehealth: Payer: Self-pay | Admitting: Gastroenterology

## 2020-12-26 NOTE — Telephone Encounter (Signed)
Patient called states he had to leave work on Friday evening due to having pain and went to the ER in [place] on Saturday seeking an excuse letter for work please advise.

## 2020-12-26 NOTE — Telephone Encounter (Signed)
Lm on vm for patient to return call 

## 2020-12-27 ENCOUNTER — Other Ambulatory Visit: Payer: Self-pay

## 2020-12-27 DIAGNOSIS — K50819 Crohn's disease of both small and large intestine with unspecified complications: Secondary | ICD-10-CM

## 2020-12-27 NOTE — Telephone Encounter (Signed)
Thanks [employer].  Was he in the ED for his Crohn's disease or something else? If for his Crohn's we can write a letter, but I have not seen him since February.  He did not follow up for colonoscopy as recommended. Can you see how he is doing with his Crohn's and if not doing well let me know. He is due for CBC, CMET, vitamin D level if you can ask him to go to the lab and also book a follow up appointment with me. Thanks

## 2020-12-27 NOTE — Telephone Encounter (Signed)
Left detailed message for pt to call back to let us know how his crohn's is, was the ER due to crohn's. Also that he had not had his colon done, he is due for labs and needs an OV. Requested he return our call.  Spoke with pt and he has already scheduled his colon. He will come for labs. Letter sent to pt via mychart.

## 2020-12-27 NOTE — Telephone Encounter (Signed)
Pt showed up in the lobby stating he got a letter from the er for 10/29 but he states he had to leave work early on the 28th but did not go to the er until 10/29. Pt requesting a letter for work for 12/23/20. Please advise.

## 2020-12-27 NOTE — Telephone Encounter (Signed)
Patient called back to state that his Chron's was doing well after receiving prednisone from the ER on Saturday.  He is concerned about getting the doctor's work absence note before Thursday when he goes back to work.  He cannot access My Chart.  If unable to reach him by phone, please leave a message.  Thank you.

## 2021-01-11 ENCOUNTER — Telehealth: Payer: Self-pay

## 2021-01-11 ENCOUNTER — Other Ambulatory Visit: Payer: Self-pay

## 2021-01-11 MED ORDER — HUMIRA PEN 40 MG/0.8ML ~~LOC~~ PNKT: 40.0000 mg | PEN_INJECTOR | SUBCUTANEOUS | 6 refills | Status: DC

## 2021-01-11 NOTE — Telephone Encounter (Signed)
Prescriber sections of the Patient Assistance Application have been completed and the packet has been mailed to patient for him to complete his sections and mail or fax to [employer].

## 2021-02-03 ENCOUNTER — Other Ambulatory Visit: Payer: Self-pay

## 2021-02-03 ENCOUNTER — Ambulatory Visit (AMBULATORY_SURGERY_CENTER): Payer: BC Managed Care – PPO

## 2021-02-03 VITALS — Ht 73.0 in | Wt 230.0 lb

## 2021-02-03 DIAGNOSIS — K50819 Crohn's disease of both small and large intestine with unspecified complications: Secondary | ICD-10-CM

## 2021-02-03 MED ORDER — NA SULFATE-K SULFATE-MG SULF 17.5-3.13-1.6 GM/177ML PO SOLN: 1.0000 | Freq: Once | ORAL | 0 refills | Status: AC

## 2021-02-03 NOTE — Progress Notes (Signed)
   Patient's pre-visit was done today over the phone with the patient   Name,DOB and address verified.   Patient denies any allergies to Eggs and Soy.  Patient denies any problems with anesthesia/sedation. Patient denies taking diet pills or blood thinners.  Denies atrial flutter or atrial fib Denies chronic constipation No home Oxygen.   Packet of Prep instructions mailed to patient including a copy of a consent form-pt is aware.  Patient understands to call us back with any questions or concerns.  Patient is aware of our care-partner policy and [id number] safety protocol.

## 2021-02-06 NOTE — Telephone Encounter (Signed)
Fax from my [place] dated 02-06-21. They have approved his application for Humira patient assistance through 02-06-22. Document scanned to chart

## 2021-02-09 ENCOUNTER — Telehealth: Payer: Self-pay

## 2021-02-09 NOTE — Telephone Encounter (Signed)
Received a fax from [place] assist stating: "Application received appears to have come from a third party and is not valid for prescription purposes. Please provide a valid prescription."  I have faxed Humira prescription back to [employer] assist at [phone].

## 2021-02-15 MED ORDER — HUMIRA (2 PEN) 40 MG/0.4ML ~~LOC~~ AJKT: 1.0000 "pen " | AUTO-INJECTOR | SUBCUTANEOUS | 3 refills | Status: DC

## 2021-02-15 NOTE — Telephone Encounter (Addendum)
Received a fax from [place] assist stating that we need to call in and speak with a pharmacist to verify prescription. I called [employer] assist and spoke with Salena Saner, pharmacist. I gave her a verbal order for Humira 40 mg/0.19m. No other information needed at this time.

## 2021-02-15 NOTE — Addendum Note (Signed)
Addended by: Yevette Edwards on: 02/15/2021 10:48 AM   Modules accepted: Orders

## 2021-02-17 ENCOUNTER — Encounter: Payer: BC Managed Care – PPO | Admitting: Gastroenterology

## 2021-03-16 ENCOUNTER — Telehealth: Payer: Self-pay

## 2021-03-16 NOTE — Telephone Encounter (Signed)
-----   Message from Yetta Flock, MD sent at 03/16/2021  8:32 AM EST ----- Regarding: folic acid Jan would you be able to contact this patient to make sure he is taking folic acid every day?  I got a message from his pharmacist stating he did not have a prescription for this and is required that he take this with methotrexate.  The last time I saw him he had a prescription for it and was taking it daily, just wanted to confirm.  Thanks

## 2021-03-16 NOTE — Telephone Encounter (Signed)
Called and Lm for patient to call back to discuss medications.

## 2021-03-17 NOTE — Telephone Encounter (Signed)
Called patient and left another message to call back and let us know if he is taking methotrexate and folic acid

## 2021-03-17 NOTE — Telephone Encounter (Signed)
Patient called back.  He indicated he is consistent with the methotrexate but not with the folic acid. I encouraged him to make sure he is taking the folic acid with the methotrexate daily. He will let me know if he needs refills [facility] [place], [place].

## 2021-04-04 ENCOUNTER — Encounter: Payer: BC Managed Care – PPO | Admitting: Gastroenterology

## 2021-04-24 ENCOUNTER — Other Ambulatory Visit: Payer: Self-pay | Admitting: Gastroenterology

## 2021-04-27 ENCOUNTER — Other Ambulatory Visit: Payer: Self-pay

## 2021-04-27 DIAGNOSIS — K50819 Crohn's disease of both small and large intestine with unspecified complications: Secondary | ICD-10-CM

## 2021-04-27 MED ORDER — FOLIC ACID 1 MG PO TABS: 1.0000 mg | ORAL_TABLET | Freq: Every day | ORAL | 1 refills | Status: DC

## 2021-04-27 NOTE — Progress Notes (Signed)
New script for folic acid 1 mg qd, [facility] [place], [place] ?

## 2021-05-09 ENCOUNTER — Ambulatory Visit (AMBULATORY_SURGERY_CENTER): Payer: BC Managed Care – PPO | Admitting: *Deleted

## 2021-05-09 ENCOUNTER — Other Ambulatory Visit: Payer: Self-pay

## 2021-05-09 VITALS — Ht 72.0 in | Wt 230.0 lb

## 2021-05-09 DIAGNOSIS — K50819 Crohn's disease of both small and large intestine with unspecified complications: Secondary | ICD-10-CM

## 2021-05-09 NOTE — Progress Notes (Signed)
No egg or soy allergy known to patient  ?No issues known to pt with past sedation with any surgeries or procedures ?Patient denies ever being told they had issues or difficulty with intubation  ?No FH of Malignant Hyperthermia ?Pt is not on diet pills ?Pt is not on  home 02  ?Pt is not on blood thinners  ?Pt denies issues with constipation  ?No A fib or A flutter ? ?Pt is fully vaccinated  for Covid  ?Pt has a Suprep Kit at home from his 02-03-2021 PV   ? ?Due to the COVID-19 pandemic we are asking patients to follow certain guidelines in PV and the [place]   ?Pt aware of COVID protocols and [facility] guidelines  ? ?PV completed over the phone. Pt verified name, DOB, address and insurance during PV today.  ?Pt mailed instruction packet with copy of consent form to read and not return, and instructions.  ?Pt encouraged to call with questions or issues.  ?If pt has My chart, procedure instructions sent via My Chart  ? ?

## 2021-05-12 ENCOUNTER — Telehealth: Payer: Self-pay | Admitting: Gastroenterology

## 2021-05-12 NOTE — Telephone Encounter (Signed)
Inbound call from patient. Reports he had to leave work early due to his stomach pains and would like a note, if possible, emailed. Email: [email or web address] ?

## 2021-05-15 NOTE — Telephone Encounter (Signed)
Called and spoke with patient. He is aware that I will compose the letter for him. He states that he missed his evening shift on 05/11/21. Pt is aware that we can not e-mail his letter. I told him that I can mail it to him or he can come by the office and pick it up. Pt states that he will come by the office today to pick up the letter, he knows to go to the 2nd floor receptionist desk. Pt verbalized understanding and had no concerns at the end of the call. ?

## 2021-05-18 ENCOUNTER — Other Ambulatory Visit: Payer: Self-pay | Admitting: Gastroenterology

## 2021-05-22 ENCOUNTER — Encounter: Payer: Self-pay | Admitting: Gastroenterology

## 2021-05-23 ENCOUNTER — Other Ambulatory Visit: Payer: Self-pay | Admitting: *Deleted

## 2021-05-23 ENCOUNTER — Other Ambulatory Visit (INDEPENDENT_AMBULATORY_CARE_PROVIDER_SITE_OTHER): Payer: BC Managed Care – PPO

## 2021-05-23 ENCOUNTER — Encounter: Payer: Self-pay | Admitting: Gastroenterology

## 2021-05-23 ENCOUNTER — Other Ambulatory Visit: Payer: Self-pay

## 2021-05-23 ENCOUNTER — Ambulatory Visit (AMBULATORY_SURGERY_CENTER): Payer: BC Managed Care – PPO | Admitting: Gastroenterology

## 2021-05-23 VITALS — BP 100/64 | HR 83 | Temp 97.8°F | Resp 14 | Ht 73.0 in | Wt 230.0 lb

## 2021-05-23 DIAGNOSIS — K50819 Crohn's disease of both small and large intestine with unspecified complications: Secondary | ICD-10-CM

## 2021-05-23 DIAGNOSIS — K5150 Left sided colitis without complications: Secondary | ICD-10-CM | POA: Diagnosis not present

## 2021-05-23 DIAGNOSIS — E559 Vitamin D deficiency, unspecified: Secondary | ICD-10-CM

## 2021-05-23 DIAGNOSIS — K529 Noninfective gastroenteritis and colitis, unspecified: Secondary | ICD-10-CM | POA: Diagnosis not present

## 2021-05-23 LAB — COMPREHENSIVE METABOLIC PANEL
ALT: 24 U/L (ref 0–53)
AST: 22 U/L (ref 0–37)
Albumin: 4.3 g/dL (ref 3.5–5.2)
Alkaline Phosphatase: 52 U/L (ref 39–117)
BUN: 8 mg/dL (ref 6–23)
CO2: 27 mEq/L (ref 19–32)
Calcium: 9.3 mg/dL (ref 8.4–10.5)
Chloride: 103 mEq/L (ref 96–112)
Creatinine, Ser: 1.06 mg/dL (ref 0.40–1.50)
GFR: 88.85 mL/min (ref >60.00)
Glucose, Bld: 89 mg/dL (ref 70–99)
Potassium: 3.9 mEq/L (ref 3.5–5.1)
Sodium: 136 mEq/L (ref 135–145)
Total Bilirubin: 0.9 mg/dL (ref 0.2–1.2)
Total Protein: 8.1 g/dL (ref 6.0–8.3)

## 2021-05-23 LAB — CBC WITH DIFFERENTIAL/PLATELET
Basophils Absolute: 0 10*3/uL (ref 0.0–0.1)
Basophils Relative: 0.6 % (ref 0.0–3.0)
Eosinophils Absolute: 0.2 10*3/uL (ref 0.0–0.7)
Eosinophils Relative: 2.5 % (ref 0.0–5.0)
HCT: 42.9 % (ref 39.0–52.0)
Hemoglobin: 14.5 g/dL (ref 13.0–17.0)
Lymphocytes Relative: 28.9 % (ref 12.0–46.0)
Lymphs Abs: 2.2 10*3/uL (ref 0.7–4.0)
MCHC: 33.7 g/dL (ref 30.0–36.0)
MCV: 85.8 fl (ref 78.0–100.0)
Monocytes Absolute: 0.7 10*3/uL (ref 0.1–1.0)
Monocytes Relative: 9 % (ref 3.0–12.0)
Neutro Abs: 4.6 10*3/uL (ref 1.4–7.7)
Neutrophils Relative %: 59 % (ref 43.0–77.0)
Platelets: 242 10*3/uL (ref 150.0–400.0)
RBC: 5 Mil/uL (ref 4.22–5.81)
RDW: 13.8 % (ref 11.5–15.5)
WBC: 7.8 10*3/uL (ref 4.0–10.5)

## 2021-05-23 LAB — VITAMIN D 25 HYDROXY (VIT D DEFICIENCY, FRACTURES): VITD: 19.49 ng/mL — ABNORMAL LOW (ref 30.00–100.00)

## 2021-05-23 LAB — VITAMIN B12: Vitamin B-12: 227 pg/mL (ref 211–911)

## 2021-05-23 MED ORDER — SODIUM CHLORIDE 0.9 % IV SOLN: 500.0000 mL | Freq: Once | INTRAVENOUS | Status: AC

## 2021-05-23 NOTE — Op Note (Signed)
[facility] ?Patient Name: Larry [name] ?Procedure Date: 05/23/2021 8:02 AM ?MRN: [id number] ?Endoscopist: Carlota Raspberry. Havery Moros , MD ?Age: 41 ?Referring MD:  ?Date of Birth: November 03, 1980 ?Gender: Male ?Account #: [id number] ?Procedure:                Colonoscopy ?Indications:              Disease activity assessment of Crohn's disease - on  ?                          Humira every other week and oral methotrexate ?Medicines:                Monitored Anesthesia Care ?Procedure:                Pre-Anesthesia Assessment: ?                          - Prior to the procedure, a History and Physical  ?                          was performed, and patient medications and  ?                          allergies were reviewed. The patient's tolerance of  ?                          previous anesthesia was also reviewed. The risks  ?                          and benefits of the procedure and the sedation  ?                          options and risks were discussed with the patient.  ?                          All questions were answered, and informed consent  ?                          was obtained. Prior Anticoagulants: The patient has  ?                          taken no previous anticoagulant or antiplatelet  ?                          agents. ASA Grade Assessment: II - A patient with  ?                          mild systemic disease. After reviewing the risks  ?                          and benefits, the patient was deemed in  ?                          satisfactory condition to undergo the procedure. ?  After obtaining informed consent, the colonoscope  ?                          was passed under direct vision. Throughout the  ?                          procedure, the patient's blood pressure, pulse, and  ?                          oxygen saturations were monitored continuously. The  ?                          CF [id number] #[id number] was introduced through the anus  ?                          and  advanced to the the terminal ileum. The  ?                          colonoscopy was performed without difficulty. The  ?                          patient tolerated the procedure well. The quality  ?                          of the bowel preparation was good. The terminal  ?                          ileum and the rectum, surgical anastomosis were  ?                          photographed. ?Scope In: 8:08:17 AM ?Scope Out: 8:24:31 AM ?Scope Withdrawal Time: 0 hours 11 minutes 5 seconds  ?Total Procedure Duration: 0 hours 16 minutes 14 seconds  ?Findings:                 The perianal and digital rectal examinations were  ?                          normal. ?                          There was evidence of a prior end-to-end  ?                          ileo-colonic anastomosis in the ascending colon.  ?                          This was patent and was characterized by a a very  ?                          mild stenosis with one focal small ulceration (  ?                          perhaps post surgical / ischemic) ?  The terminal ileum appeared normal. ?                          Patchy mild inflammation characterized by loss of  ?                          vascularity was found in the rectum, in the sigmoid  ?                          colon and in the descending colon. Very subtle in  ?                          portions of sigmoid and descending colon, most  ?                          prominent in the rectum. Biopsies were taken with a  ?                          cold forceps for histology. ?                          The exam was otherwise without abnormality. ?Complications:            No immediate complications. Estimated blood loss:  ?                          Minimal. ?Estimated Blood Loss:     Estimated blood loss was minimal. ?Impression:               - Patent end-to-end ileo-colonic anastomosis,  ?                          characterized by very mild focal ulceration and  ?                           mild stenosis. ?                          - The examined portion of the ileum was normal. ?                          - Patchy mild inflammation was found in the rectum,  ?                          in the sigmoid colon and in the descending colon as  ?                          outlined Biopsied. ?                          - The examination was otherwise normal. ?                          Overall, changes are quite mild of inflammation,  ?  most prominent in the rectum. ?Recommendation:           - Patient has a contact number available for  ?                          emergencies. The signs and symptoms of potential  ?                          delayed complications were discussed with the  ?                          patient. Return to normal activities tomorrow.  ?                          Written discharge instructions were provided to the  ?                          patient. ?                          - Resume previous diet. ?                          - Continue present medications. ?                          - Await pathology results. ?                          - Lab today (overdue for routine labs) - CBC, CMET,  ?                          vitamin D, iron / B12, quantiferon gold. May also  ?                          check Humira trough levels - will discuss with the  ?                          patient ?Remo Lipps P. Sai Moura, MD ?05/23/2021 8:33:04 AM ?This report has been signed electronically. ?

## 2021-05-23 NOTE — Progress Notes (Signed)
Error

## 2021-05-23 NOTE — Progress Notes (Deleted)
cbc

## 2021-05-23 NOTE — Progress Notes (Signed)
VSS, transported to PACU °

## 2021-05-23 NOTE — Addendum Note (Signed)
Addended by: Horris Latino on: 05/23/2021 08:42 AM ? ? Modules accepted: Orders ? ?

## 2021-05-23 NOTE — Progress Notes (Signed)
[facility] History and Physical ? ? ?Primary Care Physician:  Jacqualine Code, DO ? ? ?Reason for Procedure:   Crohn's disease ? ?Plan:    colonoscopy ? ? ? ? ?HPI: Larry [name] is a 41 y.o. male  here for colonoscopy surveillance of Crohn's disease on Humira and MTX. Feeling well.  Patient denies any bowel symptoms at this time. Otherwise feels well without any cardiopulmonary symptoms. Have discussed risks of the exam, he wishes to proceed. ? ? ?Past Medical History:  ?Diagnosis Date  ? Crohn's disease ([place])   ? Vitamin D deficiency   ? ? ?Past Surgical History:  ?Procedure Laterality Date  ? abcess removal    ? 2015, 02-2016 abcess in colon  ? bowel abscess from crohns     ? COLONOSCOPY    ? IR GENERIC HISTORICAL  04/05/2016  ? IR RADIOLOGIST EVAL & MGMT 04/05/2016 Sandi Mariscal, MD [facility] INTERV RAD  ? LAPAROSCOPIC ILEOCECECTOMY  05/14/2016  ? 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 ? ?Prior to Admission medications   ?Medication Sig Start Date End Date Taking? Authorizing Provider  ?folic acid (FOLVITE) 1 MG tablet Take 1 tablet (1 mg total) by mouth daily. 04/27/21  Yes Aarohi Redditt, Carlota Raspberry, MD  ?methotrexate (RHEUMATREX) 2.5 MG tablet TAKE 6 TABLETS BY MOUTH ONE TIME PER WEEK 04/24/21  Yes Kendrix Orman, Carlota Raspberry, MD  ?Adalimumab (HUMIRA PEN) 40 MG/0.4ML PNKT Inject 1 pen into the skin every 14 (fourteen) days. 02/15/21   Ellionna Buckbee, Carlota Raspberry, MD  ?atomoxetine (STRATTERA) 40 MG capsule atomoxetine 40 mg capsule ? TAKE 1 CAPSULE BY MOUTH EVERY DAY    [provider]  ?Cholecalciferol (VITAMIN D) 125 MCG (5000 UT) CAPS Take 5,000 Units by mouth daily. 07/29/19   Sydell Prowell, Carlota Raspberry, MD  ? ? ?Current Outpatient Medications  ?Medication Sig Dispense Refill  ? folic acid (FOLVITE) 1 MG tablet Take 1 tablet (1 mg total) by mouth daily. 90 tablet 1  ? methotrexate (RHEUMATREX) 2.5 MG tablet TAKE 6 TABLETS BY MOUTH ONE TIME PER WEEK 24 tablet 1  ? Adalimumab (HUMIRA  PEN) 40 MG/0.4ML PNKT Inject 1 pen into the skin every 14 (fourteen) days. 6 each 3  ? atomoxetine (STRATTERA) 40 MG capsule atomoxetine 40 mg capsule ? TAKE 1 CAPSULE BY MOUTH EVERY DAY    ? Cholecalciferol (VITAMIN D) 125 MCG (5000 UT) CAPS Take 5,000 Units by mouth daily. 30 capsule   ? ?Current Facility-Administered Medications  ?Medication Dose Route Frequency Provider Last Rate Last Admin  ? 0.9 %  sodium chloride infusion  500 mL Intravenous Once Braxson Hollingsworth, Carlota Raspberry, MD      ? ? ?Allergies as of 05/23/2021  ? (No Known Allergies)  ? ? ?Family History  ?Problem Relation Age of Onset  ? Stomach cancer Neg Hx   ? Colon cancer Neg Hx   ? Colon polyps Neg Hx   ? Esophageal cancer Neg Hx   ? Rectal cancer Neg Hx   ? ? ?Social History  ? ?Socioeconomic History  ? Marital status: Single  ?  Spouse name: Not on file  ? Number of children: Not on file  ? Years of education: Not on file  ? Highest education level: Not on file  ?Occupational History  ? Occupation: Glass blower/designer  ?Tobacco Use  ? Smoking status: Never  ? Smokeless tobacco: Never  ?Vaping Use  ? Vaping Use: Never used  ?Substance and Sexual Activity  ?  Alcohol use: No  ? Drug use: No  ? Sexual activity: Not on file  ?Other Topics Concern  ? Not on file  ?Social History Narrative  ? Not on file  ? ?Social Determinants of Health  ? ?Financial Resource Strain: Not on file  ?Food Insecurity: Not on file  ?Transportation Needs: Not on file  ?Physical Activity: Not on file  ?Stress: Not on file  ?Social Connections: Not on file  ?Intimate Partner Violence: Not on file  ? ? ?Review of Systems: ?All other review of systems negative except as mentioned in the HPI. ? ?Physical Exam: ?Vital signs ?BP 121/82   Pulse 72   Temp 97.8 ?F (36.6 ?C)   Ht 6' 1"  (1.854 m)   Wt 230 lb (104.3 kg)   SpO2 96%   BMI 30.34 kg/m?  ? ?General:   Alert,  Well-developed, pleasant and cooperative in NAD ?Lungs:  Clear throughout to auscultation.   ?Heart:  Regular rate and  rhythm ?Abdomen:  Soft, nontender and nondistended.   ?Neuro/Psych:  Alert and cooperative. Normal mood and affect. A and O x 3 ? ?Jolly Mango, MD ?[facility] ? ? ?

## 2021-05-23 NOTE — Patient Instructions (Signed)
? ? ?LABWORK TO BE DONE ON D/C TODAY- DR ARMBRUSTER PUT IN LAB ORDERS  ? ?AWAIT BIOPSY RESULTS  ? ? ?YOU HAD AN ENDOSCOPIC PROCEDURE TODAY AT [facility]:   Refer to the procedure report that was given to you for any specific questions about what was found during the examination.  If the procedure report does not answer your questions, please call your gastroenterologist to clarify.  If you requested that your care partner not be given the details of your procedure findings, then the procedure report has been included in a sealed envelope for you to review at your convenience later. ? ?YOU SHOULD EXPECT: Some feelings of bloating in the abdomen. Passage of more gas than usual.  Walking can help get rid of the air that was put into your GI tract during the procedure and reduce the bloating. If you had a lower endoscopy (such as a colonoscopy or flexible sigmoidoscopy) you may notice spotting of blood in your stool or on the toilet paper. If you underwent a bowel prep for your procedure, you may not have a normal bowel movement for a few days. ? ?Please Note:  You might notice some irritation and congestion in your nose or some drainage.  This is from the oxygen used during your procedure.  There is no need for concern and it should clear up in a day or so. ? ?SYMPTOMS TO REPORT IMMEDIATELY: ? ?Following lower endoscopy (colonoscopy or flexible sigmoidoscopy): ? Excessive amounts of blood in the stool ? Significant tenderness or worsening of abdominal pains ? Swelling of the abdomen that is new, acute ? Fever of 100?F or higher ? ?For urgent or emergent issues, a gastroenterologist can be reached at any hour by calling [phone]. ?Do not use MyChart messaging for urgent concerns.  ? ? ?DIET:  We do recommend a small meal at first, but then you may proceed to your regular diet.  Drink plenty of fluids but you should avoid alcoholic beverages for 24 hours. ? ?ACTIVITY:  You should plan to take it  easy for the rest of today and you should NOT DRIVE or use heavy machinery until tomorrow (because of the sedation medicines used during the test).   ? ?FOLLOW UP: ?Our staff will call the number listed on your records 48-72 hours following your procedure to check on you and address any questions or concerns that you may have regarding the information given to you following your procedure. If we do not reach you, we will leave a message.  We will attempt to reach you two times.  During this call, we will ask if you have developed any symptoms of COVID 19. If you develop any symptoms (ie: fever, flu-like symptoms, shortness of breath, cough etc.) before then, please call [phone].  If you test positive for Covid 19 in the 2 weeks post procedure, please call and report this information to [place].   ? ?If any biopsies were taken you will be contacted by phone or by letter within the next 1-3 weeks.  Please call us at [phone] if you have not heard about the biopsies in 3 weeks.  ? ? ?SIGNATURES/CONFIDENTIALITY: ?You and/or your care partner have signed paperwork which will be entered into your electronic medical record.  These signatures attest to the fact that that the information above on your After Visit Summary has been reviewed and is understood.  Full responsibility of the confidentiality of this discharge information lies with you  and/or your care-partner.  ? ? ? ?

## 2021-05-23 NOTE — Progress Notes (Signed)
VS  CW ? ?Pt's states no medical or surgical changes since previsit or office visit. ? ?

## 2021-05-25 ENCOUNTER — Telehealth: Payer: Self-pay

## 2021-05-25 NOTE — Telephone Encounter (Signed)
?  Follow up Call- ? ? ?  05/23/2021  ?  7:05 AM  ?Call back number  ?Post procedure Call Back phone  # [phone]  ?Permission to leave phone message Yes  ?  ? ?Patient questions: ? ?Do you have a fever, pain , or abdominal swelling? No. ?Pain Score  0 * ? ?Have you tolerated food without any problems? Yes.   ? ?Have you been able to return to your normal activities? Yes.   ? ?Do you have any questions about your discharge instructions: ?Diet   No. ?Medications  No. ?Follow up visit  No. ? ?Do you have questions or concerns about your Care? No. ? ?Actions: ?* If pain score is 4 or above: ?No action needed, pain <4. ? ? ?

## 2021-05-27 LAB — QUANTIFERON-TB GOLD PLUS
Mitogen-NIL: >10 IU/mL
NIL: 0.02 IU/mL
QuantiFERON-TB Gold Plus: NEGATIVE
TB1-NIL: 0 IU/mL
TB2-NIL: 0 IU/mL

## 2021-05-27 LAB — IRON,TIBC AND FERRITIN PANEL
%SAT: 21 % (calc) (ref 20–48)
Ferritin: 144 ng/mL (ref 38–380)
Iron: 54 ug/dL (ref 50–180)
TIBC: 256 mcg/dL (calc) (ref 250–425)

## 2021-05-29 ENCOUNTER — Other Ambulatory Visit: Payer: Self-pay

## 2021-05-29 DIAGNOSIS — K50819 Crohn's disease of both small and large intestine with unspecified complications: Secondary | ICD-10-CM

## 2021-05-29 DIAGNOSIS — Z79899 Other long term (current) drug therapy: Secondary | ICD-10-CM

## 2021-05-31 ENCOUNTER — Telehealth: Payer: Self-pay

## 2021-05-31 DIAGNOSIS — Z79899 Other long term (current) drug therapy: Secondary | ICD-10-CM

## 2021-05-31 DIAGNOSIS — K50819 Crohn's disease of both small and large intestine with unspecified complications: Secondary | ICD-10-CM

## 2021-05-31 MED ORDER — HUMIRA (2 PEN) 40 MG/0.4ML ~~LOC~~ AJKT: 1.0000 "pen " | AUTO-INJECTOR | SUBCUTANEOUS | 3 refills | Status: DC

## 2021-05-31 NOTE — Telephone Encounter (Signed)
Received a fax from [employer] for Humira refill request. ?Refills have been sent electronically. ?

## 2021-06-07 NOTE — Telephone Encounter (Signed)
PA Case: [id number], Status: Approved, Coverage Starts on: 06/07/2021 12:00:00 AM, Coverage Ends on: 06/07/2022 12:00:00 AM. ?

## 2021-06-07 NOTE — Telephone Encounter (Signed)
Humira PA initiated via CMM ?CMM Key: [id number] - PA Case ID: [id number] ?

## 2021-06-29 ENCOUNTER — Telehealth: Payer: Self-pay | Admitting: Gastroenterology

## 2021-06-29 NOTE — Telephone Encounter (Signed)
Thanks [employer].  ?We can do that, no problem with writing a note for him, but he has not followed up with his labs for [place]. He had a colonoscopy showing some mildly active colitis. I wanted to check his Humira trough level  to see if we need to adjust to change his regimen. Can you please ask him to come in for a Humira trough level to be done the AM of when he is next due for his Humira. He should also follow up with me in the office. IF he is feeling acutely unwell can you ask him what is going on, may need steroids in the interim. Thanks  ? ?Called and spoke with patient regarding Dr. Doyne Keel recommendations. Pt is aware that he is overdue for labs. It old him that it is important that he gets the labs drawn prior to his next injection that way Dr. Havery Moros will know if he needs to adjust his medication dosing at all. Pt states that he does not feel like he is having a bad flare, but he thinks that his stomach gets upset when he eats certain things. He denies any blood in the stool. Pt states that his next Humira injection is due on 07/08/21. I told pt that he can come by the lab the morning of 07/07/21. Pt has been scheduled for a follow up with Dr. Havery Moros on Tuesday, 08/01/21 at 8:10 am. Pt is aware that the work excuse will be available in [place]. Pt verbalized understanding and had no concerns at the end of the call. ? ?Lab reminder in epic. ?

## 2021-06-29 NOTE — Telephone Encounter (Signed)
Patient called stating he had to miss work last night because of his Crohn's.  He is asking that you send an excuse note to his My Chart.  If there are any issues with providing this note, please call patient and advise.  Thank you. ?

## 2021-07-06 ENCOUNTER — Telehealth: Payer: Self-pay

## 2021-07-06 NOTE — Telephone Encounter (Signed)
-----   Message from Yevette Edwards, RN sent at 06/29/2021  1:33 PM EDT ----- ?Regarding: Labs on 5/12 ?Humira trough and Ab level - order is in epic ?Next Humira injection due on 5/13 ? ?

## 2021-07-06 NOTE — Telephone Encounter (Signed)
Lm on vm for patient to return call 

## 2021-07-07 NOTE — Telephone Encounter (Signed)
Called and spoke with patient. He stated that his Humira injection is actually due on 07/09/21. Pt states that he will hold injection on Sunday and come in for labs on Monday morning and then give himself his injection. Pt asked that I give him a call on Monday as a reminder. Pt had no other concerns at the end of the call. ?

## 2021-07-10 ENCOUNTER — Other Ambulatory Visit: Payer: BC Managed Care – PPO

## 2021-07-10 ENCOUNTER — Telehealth: Payer: Self-pay

## 2021-07-10 DIAGNOSIS — K50819 Crohn's disease of both small and large intestine with unspecified complications: Secondary | ICD-10-CM

## 2021-07-10 DIAGNOSIS — Z79899 Other long term (current) drug therapy: Secondary | ICD-10-CM

## 2021-07-10 NOTE — Telephone Encounter (Signed)
-----   Message from Yevette Edwards, RN sent at 07/07/2021 10:14 AM EDT ----- ?Regarding: Labs today ?Lab reminder - see alternate telephone encounter ?Pt coming for Humira trough and AB today ? ?

## 2021-07-10 NOTE — Telephone Encounter (Signed)
Patient came in for labs. 

## 2021-07-10 NOTE — Telephone Encounter (Signed)
Lm on vm for patient to return call 

## 2021-07-18 LAB — SERIAL MONITORING

## 2021-07-19 LAB — ADALIMUMAB+AB (SERIAL MONITOR)
Adalimumab Drug Level: 3.5 ug/mL
Anti-Adalimumab Antibody: 113 ng/mL

## 2021-08-01 ENCOUNTER — Encounter: Payer: Self-pay | Admitting: Gastroenterology

## 2021-08-01 ENCOUNTER — Ambulatory Visit (INDEPENDENT_AMBULATORY_CARE_PROVIDER_SITE_OTHER): Payer: BC Managed Care – PPO | Admitting: Gastroenterology

## 2021-08-01 VITALS — BP 116/70 | HR 84 | Ht 72.0 in | Wt 230.4 lb

## 2021-08-01 DIAGNOSIS — K50819 Crohn's disease of both small and large intestine with unspecified complications: Secondary | ICD-10-CM | POA: Diagnosis not present

## 2021-08-01 DIAGNOSIS — Z79899 Other long term (current) drug therapy: Secondary | ICD-10-CM

## 2021-08-01 MED ORDER — RINVOQ 45 MG PO TB24: 45.0000 mg | ORAL_TABLET | Freq: Every day | ORAL | 0 refills | Status: DC

## 2021-08-01 MED ORDER — ZOSTER VAC RECOMB ADJUVANTED 50 MCG/0.5ML IM SUSR: 0.5000 mL | Freq: Once | INTRAMUSCULAR | 0 refills | Status: AC

## 2021-08-01 NOTE — Patient Instructions (Addendum)
If you are age 39 or older, your body mass index should be between 23-30. Your Body mass index is 31.24 kg/m. If this is out of the aforementioned range listed, please consider follow up with your Primary Care Provider.  If you are age 59 or younger, your body mass index should be between 19-25. Your Body mass index is 31.24 kg/m. If this is out of the aformentioned range listed, please consider follow up with your Primary Care Provider.   ________________________________________________________  The [facility] providers would like to encourage you to use [facility] to communicate with providers for non-urgent requests or questions.  Due to long hold times on the telephone, sending your provider a message by [facility] may be a faster and more efficient way to get a response.  Please allow 48 business hours for a response.  Please remember that this is for non-urgent requests.  _______________________________________________________  Stop Methotrexate, Humira and folic acid.  We have given you samples of the following medication to take: RINVOQ  Wait 2 weeks after stopping Humira and start RINVOQ  45 mg: Take 1 tablet by mouth once daily for 12 weeks. Then take 30 mg once daily thereafter  You will need to go to the lab 2 WEEKS after you start RINVOQ.  Our lab is located in the basement of our building, located at [address]. They are open Monday through Friday from 7:30 am to 5:00 pm. You do not need an appointment.  We are giving you a script for the [facility] vaccine to get at your local pharmacy.  Please follow up in 4 months.

## 2021-08-01 NOTE — Progress Notes (Signed)
HPI :  Crohn's history: Ileocolonic Crohn's disease diagnosed in 2015. He presented initially with spontaneous abscess which required drainage. He was followed in [place], [place] and maintained on Imuran 127m daily for 3 years or so, and [facility] another [facility] perforation with multiple retroperitoneal abscess in Jan 2018. He was treated with bowel rest and TPN and antibiotics, IR was able to drain one of the abscesses at the time. [facility]Dr. ADrue Flirt after his initial hospitalization, and he declined surgery at the time as he was feeling better. Unfortunately he failed conservative managemenet and had recurrence of abscess formation, [facility] and [facility].    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 Post-operatively he was started on Humira and methotrexate.    SINCE LAST VISIT:   41year old here for follow-up visit.  I last saw him in the office about a year ago.  At that time he was doing pretty well with Humira and methotrexate and we continued the regimen.  I had recommended a follow-up colonoscopy to survey his disease at that time.  He did not follow-up for it at the time to schedule it.  In October he was in the ER for some symptoms and sounds like he was given a course of steroids.  He called in this past March with some symptoms that kept him out of work of abdominal pain.  He eventually came in for his colonoscopy in March 28, as outlined below.  He had some mild stricturing at the surgical anastomosis with mild inflammation there, also had some mildly active colitis in the left colon.  A few weeks ago we obtained a Humira trough level which was 3.5, with an intermediate antibody titer of 113.  I asked him to come in  the office to discuss long-term plan given these findings.  He has some loose stools that can occur and bother him periodically, he has some urgency to go to the bathroom but does not always have a bowel movement with that.  Since that he has some tenesmus.  No blood in the stools.  He has not been having much abdominal pains recently, his appetite is poor though, below his baseline.  Denies any joint pains, no skin rashes or lesions.  Weight is stable.    IBD Health Care Maintenance: Annual Flu Vaccine - UTD Pneum[facility] Vaccine : [facility]  QG negative 3/23 Vitamin D screening - Date: deficient - on supplementation Last Colonoscopy 3/23 - mild stenosis / ulceration at anastomosis, active left sided colitis     Colonoscopy 05/23/21: The perianal and digital rectal examinations were normal. - There was evidence of a prior end-to-end ileo-colonic anastomosis in the ascending colon. This was patent and was characterized by a a very mild stenosis with one focal small ulceration ( perhaps post surgical / ischemic) - The terminal ileum appeared normal. - Patchy mild inflammation characterized by loss of vascularity was found in the rectum, in the sigmoid colon and in the descending colon. Very subtle in portions of sigmoid and descending colon, most prominent in the rectum. Biopsies were taken with a cold forceps for histology. - The exam was otherwise without abnormality.  1. Surgical [P], left colon - MILD TO MODERATELY ACTIVE CHRONIC COLITIS. -  NEGATIVE FOR DYSPLASIA. 2. Surgical [P], colon, rectum - MILDLY ACTIVE CHRONIC COLITIS. - NEGATIVE FOR DYSPLASIA.  Labs 07/10/21 Humira level of 3.5 Antibody level of 113 - despite being on MTX  QG negative 05/23/21 Iron and B12 okay on 05/24/11 Vitamin D 19.49   Past Medical History:  Diagnosis Date   Crohn's disease ([place])    Vitamin D deficiency      Past Surgical History:  Procedure Laterality Date   abcess  removal     2015, 02-2016 abcess in colon   bowel abscess from crohns      COLONOSCOPY     IR GENERIC HISTORICAL  04/05/2016   IR RADIOLOGIST EVAL & MGMT 04/05/2016 Sandi Mariscal, MD [facility] INTERV RAD   LAPAROSCOPIC ILEOCECECTOMY  05/14/2016   with debridement of chronic abscess cavity asnd takedown of fistulas   Family History  Problem Relation Age of Onset   Stomach cancer Neg Hx    Colon cancer Neg Hx    Colon polyps Neg Hx    Esophageal cancer Neg Hx    Rectal cancer Neg Hx    Social History   Tobacco Use   Smoking status: Never   Smokeless tobacco: Never  Vaping Use   Vaping Use: Never used  Substance Use Topics   Alcohol use: No   Drug use: No   Current Outpatient Medications  Medication Sig Dispense Refill   Adalimumab (HUMIRA PEN) 40 MG/0.4ML PNKT Inject 1 pen. into the skin every 14 (fourteen) days. 6 each 3   Cholecalciferol (VITAMIN D) 125 MCG (5000 UT) CAPS Take 5,000 Units by mouth daily. 30 capsule    folic acid (FOLVITE) 1 MG tablet Take 1 tablet (1 mg total) by mouth daily. 90 tablet 1   methotrexate (RHEUMATREX) 2.5 MG tablet TAKE 6 TABLETS BY MOUTH ONE TIME PER WEEK 24 tablet 1   atomoxetine (STRATTERA) 40 MG capsule atomoxetine 40 mg capsule  TAKE 1 CAPSULE BY MOUTH EVERY DAY (Patient not taking: Reported on 08/01/2021)     Current Facility-Administered Medications  Medication Dose Route Frequency Provider Last Rate Last Admin   0.9 %  sodium chloride infusion  500 mL Intravenous Once Star Cheese, Carlota Raspberry, MD       No Known Allergies   Review of Systems: All systems reviewed and negative except where noted in HPI.   Lab Results  Component Value Date   WBC 7.8 05/23/2021   HGB 14.5 05/23/2021   HCT 42.9 05/23/2021   MCV 85.8 05/23/2021   PLT 242.0 05/23/2021    Lab Results  Component Value Date   CREATININE 1.06 05/23/2021   BUN 8 05/23/2021   NA 136 05/23/2021   K 3.9 05/23/2021   CL 103 05/23/2021   CO2 27 05/23/2021    Lab Results   Component Value Date   ALT 24 05/23/2021   AST 22 05/23/2021   ALKPHOS 52 05/23/2021   BILITOT 0.9 05/23/2021     Physical Exam: BP 116/70   Pulse 84   Ht 6' (1.829 m)   Wt 230 lb 6 oz (104.5 kg)   BMI 31.24 kg/m  Constitutional: Pleasant,well-developed, male in no acute distress. Neurological: Alert and oriented to person place and time. Psychiatric: Normal mood and affect. Behavior is normal.   ASSESSMENT AND PLAN: 41 year old male here for reassessment of the following:  Ileocolonic Crohn's disease High risk medication  As above, historically had failed thiopurine's, had perforating disease with abscess, led to surgery, postoperatively placed on Humira  and oral methotrexate (he declined SQ).  Had done well for a few years postoperatively but having some intermittent bothersome symptoms.  Colonoscopy shows some active disease as outlined.  His Humira trough is low with a moderate antibody formation despite methotrexate.  Unfortunately I think he is failing Humira, I think we need to make a change in his regimen given his development of immunogenicity to Humira and severe disease.  I discussed options with him in this setting.  Fortunately we have some new drugs on the market for Crohn's disease which are quite effective and safe.  I think his next best options would be Skyrizi or Rinvoq.  I discussed both of these with him, including risks and benefits.  An oral regimen would be preferred for him, and after discussion of Rinvoq including risks, that his his preference.  At this point time we will stop Humira, stop methotrexate, stop folic acid.  We will complete paperwork to start Rinvoq and ask for approval from insurance.  Dosing will be 45 mg/day for 12 weeks and then decrease to 30 mg/day thereafter.  I provided him samples start.  I recommend he wait 1 to 2 weeks to let the Humira and methotrexate flush out before we start the Rinvoq.  He will need CBC and CMET 1 month after  starting drug.  He will need lipid panel in 12 weeks.  I recommend Shingrix vaccine to reduce his risk for shingles on this regimen, prescription provided he was agreeable to have this done.  I would like to see him back in 3 to 4 months for follow-up.  If for some reason Rinvoq is not approved by insurance he needs to contact me so we can discuss other options.  Plan: - stop Humira - stop methotrexate - stop folic acid - completed paperwork for Rinvoq and will ask for approval from insurance - hopefully can soon start Rinvoq 17m / day for 12 weeks, and then 320m/ day thereafter. 2 week free sample given - needs labs CBC and CMET one month after starting drug, lipid panel in next few months - recommend shingrix vaccine, script given - f/u 3-4 months for follow up  I spent 35 minutes of time, including in depth chart review,, face-to-face time with the patient, and documentation.  StJolly MangoMD [facility]

## 2021-08-08 ENCOUNTER — Telehealth: Payer: Self-pay

## 2021-08-08 DIAGNOSIS — K50819 Crohn's disease of both small and large intestine with unspecified complications: Secondary | ICD-10-CM

## 2021-08-08 NOTE — Telephone Encounter (Signed)
-----   Message from Greggory Keen, LPN sent at 4/93/5521 12:41 PM EDT ----- [facility] called about the patient's Rinvoq.  The patient has Humira patient assistance. He can have it changed to Rinvoq using the Complete Pro. Just send the prescriber portion electronically or print it and fax.  It looks like Jan was working on it.

## 2021-08-08 NOTE — Telephone Encounter (Signed)
My [employer] patient assistance application for Rinvoq has been submitted via Complete Pro.

## 2021-08-17 MED ORDER — RINVOQ 30 MG PO TB24: 1.0000 | ORAL_TABLET | Freq: Every day | ORAL | 1 refills | Status: DC

## 2021-08-17 MED ORDER — RINVOQ 45 MG PO TB24: 1.0000 | ORAL_TABLET | Freq: Every day | ORAL | 0 refills | Status: DC

## 2021-08-17 NOTE — Telephone Encounter (Signed)
Called patient to follow up and see if he was able to get the prescription for Rinvoq yet. Pt states that he received a call from [facility] yesterday and will have to call them back to set up shipment of his medication. He did not realize that he had to get this at a specialty pharmacy. Pt has been advised to call and let us know when he starts the medication so that we can place the appropriate lab reminder. Pt verbalized understanding and had no concerns at the end of the call.

## 2021-08-24 NOTE — Telephone Encounter (Signed)
Lm on vm for patient to return call 

## 2021-08-31 NOTE — Telephone Encounter (Signed)
Called and spoke with patient. He will stop by the office in the morning to pick up samples from 2nd floor receptionist desk. Pt is aware that he will need labs on 09/14/21. Pt verbalized understanding and had no concerns at the end of the call.

## 2021-08-31 NOTE — Telephone Encounter (Signed)
Pt returned call. He reports that [facility] reached out to him and told him that Rinvoq does require a PA. He also wanted to know if we had any additional samples until her is able to get the prescription. Pt finished sample on 08/29/21. I told patient that I will call [facility] and check with Dr. Havery Moros regarding samples. Pt had no concerns at the end of the call.  Secure staff message sent to Dr. Havery Moros to see if he has any samples.   On [employer] complete pro it stated that Rinvoq was covered and no PA required.  Called [place] and spoke with Alorah. She reports that the claim for Rinvoq 45 mg tablet and Rinvoq 30 mg tablets are being rejected due to PA requirement. Will route to [employer], CPhT.

## 2021-08-31 NOTE — Telephone Encounter (Signed)
Called and left message for patient to call back to confirm he was able to get his RINVOQ and to let us know When we started so we can place reminders for labs

## 2021-08-31 NOTE — Telephone Encounter (Signed)
2 boxes of RINVOQ 45 mg (4 week supply) left at the 2nd floor for patient to pick up

## 2021-09-04 ENCOUNTER — Other Ambulatory Visit (HOSPITAL_COMMUNITY): Payer: Self-pay

## 2021-09-04 NOTE — Telephone Encounter (Signed)
Patient Advocate Encounter   Received notification from RN that prior authorization for Rinvoq is required by his/her insurance [employer].   PA submitted on 09/04/21 Key [id number] Status is pending    [facility] will continue to follow:  Patient Advocate Fax:  [phone]

## 2021-09-06 NOTE — Telephone Encounter (Signed)
Good morning, I looked up the submitted PA for Rinvoq. It looks like the PA was denied. Is there a denial reason? The starter dose is Rinvoq 45 mg tablet and 30 mg tablets are for the maintenance dose. Thanks

## 2021-09-06 NOTE — Telephone Encounter (Signed)
Received a fax regarding Prior Authorization from [facility] for [facility]. Authorization has been DENIED because:   .

## 2021-09-07 NOTE — Telephone Encounter (Signed)
Pt called into the office today after following up with specialty pharmacy. Pt was informed that the PA was denied, but we are working on this now. I told pt that we may have to appeal this. Pt has been notified that we will inform Dr. Havery Moros so that he is aware. I told pt to continue Rinvoq 45 mg samples that we gave him. Pt knows that we will be in contact once we hear back from his insurance. Pt verbalized understanding and had no concerns at the ned of the call.

## 2021-09-08 NOTE — Telephone Encounter (Signed)
Brooklyn can you please keep me posted about this. I was under the impression this had already been approved by the time we started him on free samples. He has severe Crohn's, I really would like to keep him on this drug. We will appeal if needed. Thanks

## 2021-09-11 NOTE — Telephone Encounter (Signed)
Larry [name] is there any way to appeal this decision? Did his insurance company state what drug would be covered given he has failed some options in the past? Thanks

## 2021-09-12 NOTE — Telephone Encounter (Signed)
I called and spoke with patient. He is aware that medication has been Approved. He knows that we will have the PA team reach out to the pharmacy to let them know about the approval. Pt will come by for labs this week. His has been taking Rinvoq for about 3.5 weeks since he missed about 5 days in early July. Pt knows that he will need lipid panel at the 12 week mark as well. Pt verbalized understanding and had no concerns at the end of the call.   [place], will you please see information below regarding approval and contact pt's pharmacy to make sure prescription has been received? Thank you

## 2021-09-12 NOTE — Telephone Encounter (Signed)
Dr. Havery Moros, I have printed a copy of the denial letter. There is a phone number listed where you can contact the reviewer to discuss the denial. I did not see a list of alternative medications that are covered. I have placed the denial letter in your office for your review. Thanks

## 2021-09-12 NOTE — Telephone Encounter (Signed)
I reviewed the paperwork for his denial letter.  This did not make sense to me in regards to why they denied the drug.  I called [facility], the transfer me to peer to peer line and I discussed this case with them.  I was on the phone with them for roughly half an hour.  Ultimately after our conversation they stated the drug is approved.  Authorization #[id number].  They state his pharmacy needs to contact their pharmacy help desk to help this get prescribed for him -that contact number is [phone].  [place]lyn can you relay this to the patient's pharmacy and asked them to contact the pharmacy helpdesk to clarify how they can get the drug to him.  Hopefully this works out for him.  He should contact us if not if there is any further issues getting the medication.  Thanks   Otherwise the patient is due for a follow-up CBC and CMET if he can ask him to go to the lab.  At week 12 after he started taking this (I am not sure how many weeks he is at now) he will need a lipid panel.  Thanks

## 2021-09-14 ENCOUNTER — Other Ambulatory Visit (HOSPITAL_COMMUNITY): Payer: Self-pay

## 2021-09-14 MED ORDER — RINVOQ 45 MG PO TB24: 1.0000 | ORAL_TABLET | Freq: Every day | ORAL | 0 refills | Status: DC

## 2021-09-14 MED ORDER — RINVOQ 30 MG PO TB24: 1.0000 | ORAL_TABLET | Freq: Every day | ORAL | 1 refills | Status: DC

## 2021-09-14 NOTE — Telephone Encounter (Signed)
Prescriptions have been sent to [facility]. Pt knows that [facility] will contact him directly to set up shipment of the medications. Pt also reminded again that he is due for labs, pt states that he can come by on Monday for labs. Pt had no concerns at the end of the call.

## 2021-10-03 ENCOUNTER — Telehealth: Payer: Self-pay

## 2021-10-03 NOTE — Telephone Encounter (Signed)
[facility] specialty pharmacy to follow up on Rinvoq prescription for patient. I spoke with Larry [name] and was informed that Rinvoq 45 mg tablets should have been delivered to patient on 09/29/21 for a 28-days supply. I questioned why the patient was only receiving a 28-day supply and 84 tablets were sent in. There was a note that states the first portion of the starter dose was mailed out. Rinvoq 30 mg is available and ready to fill as well, but pt will need to contact pharmacy when he is due for refill. I need to clarify with patient how many weeks he has actually completed of the medication. He is also overdue for labs. Pt was supposed to have CBC and CMET 1 month after starting Rinvoq. Lipid panel at 12 weeks.   Lm on vm for patient to return call.

## 2021-10-17 NOTE — Telephone Encounter (Signed)
Called and spoke with patient. He confirmed that he received his prescription delivery. He states that he knows he is overdue for labs. He states that he has been out of town and plans to come in this week for CBC and CMET. Pt should be at the 9-week mark of treatment this week. Pt is aware that he will need lipid panel in 3 weeks. Pt knows that at the 12 week mark he can transition to his maintenance dose of Rinvoq 30 mg/ daily. Pt verbalized understanding and had no concerns at the end of the call.

## 2021-10-20 ENCOUNTER — Other Ambulatory Visit (INDEPENDENT_AMBULATORY_CARE_PROVIDER_SITE_OTHER): Payer: BC Managed Care – PPO

## 2021-10-20 DIAGNOSIS — Z79899 Other long term (current) drug therapy: Secondary | ICD-10-CM | POA: Diagnosis not present

## 2021-10-20 DIAGNOSIS — K50819 Crohn's disease of both small and large intestine with unspecified complications: Secondary | ICD-10-CM

## 2021-10-20 LAB — COMPREHENSIVE METABOLIC PANEL
ALT: 22 U/L (ref 0–53)
AST: 24 U/L (ref 0–37)
Albumin: 4.3 g/dL (ref 3.5–5.2)
Alkaline Phosphatase: 58 U/L (ref 39–117)
BUN: 10 mg/dL (ref 6–23)
CO2: 25 mEq/L (ref 19–32)
Calcium: 9.3 mg/dL (ref 8.4–10.5)
Chloride: 104 mEq/L (ref 96–112)
Creatinine, Ser: 0.99 mg/dL (ref 0.40–1.50)
GFR: 96.16 mL/min (ref >60.00)
Glucose, Bld: 86 mg/dL (ref 70–99)
Potassium: 3.9 mEq/L (ref 3.5–5.1)
Sodium: 139 mEq/L (ref 135–145)
Total Bilirubin: 0.8 mg/dL (ref 0.2–1.2)
Total Protein: 8.1 g/dL (ref 6.0–8.3)

## 2021-10-20 LAB — CBC WITH DIFFERENTIAL/PLATELET
Basophils Absolute: 0 10*3/uL (ref 0.0–0.1)
Basophils Relative: 0.5 % (ref 0.0–3.0)
Eosinophils Absolute: 0.1 10*3/uL (ref 0.0–0.7)
Eosinophils Relative: 1.1 % (ref 0.0–5.0)
HCT: 40.4 % (ref 39.0–52.0)
Hemoglobin: 13.7 g/dL (ref 13.0–17.0)
Lymphocytes Relative: 36.8 % (ref 12.0–46.0)
Lymphs Abs: 2.5 10*3/uL (ref 0.7–4.0)
MCHC: 34 g/dL (ref 30.0–36.0)
MCV: 87.6 fl (ref 78.0–100.0)
Monocytes Absolute: 0.6 10*3/uL (ref 0.1–1.0)
Monocytes Relative: 9.1 % (ref 3.0–12.0)
Neutro Abs: 3.5 10*3/uL (ref 1.4–7.7)
Neutrophils Relative %: 52.5 % (ref 43.0–77.0)
Platelets: 305 10*3/uL (ref 150.0–400.0)
RBC: 4.61 Mil/uL (ref 4.22–5.81)
RDW: 14.5 % (ref 11.5–15.5)
WBC: 6.7 10*3/uL (ref 4.0–10.5)

## 2021-10-20 NOTE — Telephone Encounter (Signed)
Pt came in for labs this afternoon.

## 2021-11-09 ENCOUNTER — Telehealth: Payer: Self-pay

## 2021-11-09 DIAGNOSIS — K50819 Crohn's disease of both small and large intestine with unspecified complications: Secondary | ICD-10-CM

## 2021-11-09 DIAGNOSIS — Z79899 Other long term (current) drug therapy: Secondary | ICD-10-CM

## 2021-11-09 NOTE — Telephone Encounter (Signed)
Spoke with patient to remind him that he is due for repeat labs at this time since he is at the 12 week mark. 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 Pt states that he can come by next week for labs. Pt also has been advised to transition to his maintenance dose of Rinvoq 30 mg, pt will call the pharmacy to initiate fill. Patient verbalized understanding and had no concerns at the end of the call.   Lab orders in epic.

## 2021-11-09 NOTE — Telephone Encounter (Signed)
-----   Message from Yevette Edwards, RN sent at 10/17/2021 12:15 PM EDT ----- Regarding: Labs Lipid panel, CBC, LFTs due - need to enter orders 12 weeks of Rinvoq 45 mg, pt can transition to maintenance dose

## 2021-11-16 NOTE — Telephone Encounter (Signed)
Patient has not come in for labs. MyChart message with reminder sent to patient.

## 2021-11-17 NOTE — Telephone Encounter (Signed)
Patient reviewed MyChart message. Last read by Rexene Alberts at  6:21 AM on 11/17/2021.

## 2021-11-20 ENCOUNTER — Other Ambulatory Visit (INDEPENDENT_AMBULATORY_CARE_PROVIDER_SITE_OTHER): Payer: BC Managed Care – PPO

## 2021-11-20 DIAGNOSIS — Z79899 Other long term (current) drug therapy: Secondary | ICD-10-CM

## 2021-11-20 DIAGNOSIS — K50819 Crohn's disease of both small and large intestine with unspecified complications: Secondary | ICD-10-CM

## 2021-11-20 LAB — CBC WITH DIFFERENTIAL/PLATELET
Basophils Absolute: 0 10*3/uL (ref 0.0–0.1)
Basophils Relative: 0.5 % (ref 0.0–3.0)
Eosinophils Absolute: 0.1 10*3/uL (ref 0.0–0.7)
Eosinophils Relative: 0.7 % (ref 0.0–5.0)
HCT: 42 % (ref 39.0–52.0)
Hemoglobin: 14.3 g/dL (ref 13.0–17.0)
Lymphocytes Relative: 27.5 % (ref 12.0–46.0)
Lymphs Abs: 2.4 10*3/uL (ref 0.7–4.0)
MCHC: 34.1 g/dL (ref 30.0–36.0)
MCV: 87.7 fl (ref 78.0–100.0)
Monocytes Absolute: 0.8 10*3/uL (ref 0.1–1.0)
Monocytes Relative: 9.4 % (ref 3.0–12.0)
Neutro Abs: 5.4 10*3/uL (ref 1.4–7.7)
Neutrophils Relative %: 61.9 % (ref 43.0–77.0)
Platelets: 281 10*3/uL (ref 150.0–400.0)
RBC: 4.79 Mil/uL (ref 4.22–5.81)
RDW: 14.5 % (ref 11.5–15.5)
WBC: 8.7 10*3/uL (ref 4.0–10.5)

## 2021-11-20 LAB — LIPID PANEL
Cholesterol: 130 mg/dL (ref 0–200)
HDL: 45 mg/dL (ref >39.00)
LDL Cholesterol: 57 mg/dL (ref 0–99)
NonHDL: 85.31
Total CHOL/HDL Ratio: 3
Triglycerides: 144 mg/dL (ref 0.0–149.0)
VLDL: 28.8 mg/dL (ref 0.0–40.0)

## 2021-11-20 LAB — HEPATIC FUNCTION PANEL
ALT: 31 U/L (ref 0–53)
AST: 29 U/L (ref 0–37)
Albumin: 4.4 g/dL (ref 3.5–5.2)
Alkaline Phosphatase: 50 U/L (ref 39–117)
Bilirubin, Direct: 0.1 mg/dL (ref 0.0–0.3)
Total Bilirubin: 0.7 mg/dL (ref 0.2–1.2)
Total Protein: 8.5 g/dL — ABNORMAL HIGH (ref 6.0–8.3)

## 2021-12-29 ENCOUNTER — Ambulatory Visit: Payer: BC Managed Care – PPO | Admitting: Gastroenterology

## 2022-01-02 ENCOUNTER — Ambulatory Visit: Payer: BC Managed Care – PPO | Admitting: Gastroenterology

## 2022-03-01 ENCOUNTER — Ambulatory Visit: Payer: BC Managed Care – PPO | Admitting: Gastroenterology

## 2022-04-03 ENCOUNTER — Encounter (HOSPITAL_COMMUNITY): Payer: Self-pay

## 2022-04-03 ENCOUNTER — Emergency Department (HOSPITAL_COMMUNITY)
Admission: EM | Admit: 2022-04-03 | Discharge: 2022-04-03 | Disposition: A | Discharge status: Home / Self Care | Payer: BC Managed Care – PPO | Attending: Emergency Medicine | Admitting: Emergency Medicine

## 2022-04-03 ENCOUNTER — Emergency Department (HOSPITAL_COMMUNITY): Payer: BC Managed Care – PPO

## 2022-04-03 DIAGNOSIS — N201 Calculus of ureter: Secondary | ICD-10-CM | POA: Insufficient documentation

## 2022-04-03 DIAGNOSIS — R1032 Left lower quadrant pain: Secondary | ICD-10-CM | POA: Diagnosis present

## 2022-04-03 LAB — CBC WITH DIFFERENTIAL/PLATELET
Abs Immature Granulocytes: 0.03 10*3/uL (ref 0.00–0.07)
Basophils Absolute: 0.1 10*3/uL (ref 0.0–0.1)
Basophils Relative: 1 %
Eosinophils Absolute: 0.2 10*3/uL (ref 0.0–0.5)
Eosinophils Relative: 2 %
HCT: 46.1 % (ref 39.0–52.0)
Hemoglobin: 14.9 g/dL (ref 13.0–17.0)
Immature Granulocytes: 0 %
Lymphocytes Relative: 33 %
Lymphs Abs: 3 10*3/uL (ref 0.7–4.0)
MCH: 28.5 pg (ref 26.0–34.0)
MCHC: 32.3 g/dL (ref 30.0–36.0)
MCV: 88.1 fL (ref 80.0–100.0)
Monocytes Absolute: 1 10*3/uL (ref 0.1–1.0)
Monocytes Relative: 11 %
Neutro Abs: 4.8 10*3/uL (ref 1.7–7.7)
Neutrophils Relative %: 53 %
Platelets: 306 10*3/uL (ref 150–400)
RBC: 5.23 MIL/uL (ref 4.22–5.81)
RDW: 12.8 % (ref 11.5–15.5)
WBC: 9 10*3/uL (ref 4.0–10.5)
nRBC: 0 % (ref 0.0–0.2)

## 2022-04-03 LAB — BASIC METABOLIC PANEL
Anion gap: 7 (ref 5–15)
BUN: 15 mg/dL (ref 6–20)
CO2: 24 mmol/L (ref 22–32)
Calcium: 9.2 mg/dL (ref 8.9–10.3)
Chloride: 106 mmol/L (ref 98–111)
Creatinine, Ser: 0.97 mg/dL (ref 0.61–1.24)
GFR, Estimated: >60 mL/min (ref >60)
Glucose, Bld: 106 mg/dL — ABNORMAL HIGH (ref 70–99)
Potassium: 4.1 mmol/L (ref 3.5–5.1)
Sodium: 137 mmol/L (ref 135–145)

## 2022-04-03 LAB — URINALYSIS, ROUTINE W REFLEX MICROSCOPIC
Bacteria, UA: NONE SEEN
Bilirubin Urine: NEGATIVE
Glucose, UA: NEGATIVE mg/dL
Ketones, ur: NEGATIVE mg/dL
Leukocytes,Ua: NEGATIVE
Nitrite: NEGATIVE
Protein, ur: NEGATIVE mg/dL
Specific Gravity, Urine: 1.023 (ref 1.005–1.030)
pH: 6 (ref 5.0–8.0)

## 2022-04-03 MED ORDER — SODIUM CHLORIDE (PF) 0.9 % IJ SOLN
INTRAMUSCULAR | Status: AC
  Filled 2022-04-03: qty 50

## 2022-04-03 MED ORDER — IOHEXOL 300 MG/ML  SOLN
100.0000 mL | Freq: Once | INTRAMUSCULAR | Status: AC | PRN
  Administered 2022-04-03: 100 mL via INTRAVENOUS

## 2022-04-03 MED ORDER — KETOROLAC TROMETHAMINE 15 MG/ML IJ SOLN
15.0000 mg | Freq: Once | INTRAMUSCULAR | Status: AC
  Administered 2022-04-03: 15 mg via INTRAVENOUS
  Filled 2022-04-03: qty 1

## 2022-04-03 MED ORDER — HYDROCODONE-ACETAMINOPHEN 5-325 MG PO TABS: 1.0000 | ORAL_TABLET | ORAL | 0 refills | Status: DC | PRN

## 2022-04-03 MED ORDER — TAMSULOSIN HCL 0.4 MG PO CAPS: 0.4000 mg | ORAL_CAPSULE | Freq: Every day | ORAL | 0 refills | Status: DC

## 2022-04-03 MED ORDER — SODIUM CHLORIDE 0.9 % IV BOLUS
1000.0000 mL | Freq: Once | INTRAVENOUS | Status: AC
  Administered 2022-04-03: 1000 mL via INTRAVENOUS

## 2022-04-03 MED ORDER — ONDANSETRON 4 MG PO TBDP: 4.0000 mg | ORAL_TABLET | Freq: Three times a day (TID) | ORAL | 0 refills | Status: DC | PRN

## 2022-04-03 NOTE — ED Triage Notes (Signed)
Patient arrived stating two weeks ago he was diagnosed with a 22m kidney stone. Today has complaints of left sided flank pain that he believes may be inflammation due to passing it.

## 2022-04-03 NOTE — ED Provider Notes (Signed)
[place] EMERGENCY DEPARTMENT AT [facility] Provider Note   CSN: [id number] Arrival date & time: 04/03/22  0048     History  Chief Complaint  Patient presents with   Flank Pain    Larry [name] is a 42 y.o. male.  42 year old male with past medical history of Crohn's disease and kidney stones presents with complaint of left lower quadrant abdominal pain.  Patient reports onset of pain about a week ago, CT with contrast at that time diagnosed a 1 mm left ureteral stone at the L4 level.  Patient states that he was told it was so small he probably would not even notice that it passed it.  He states that his symptoms improved however he has now left lower quadrant pain which is worse at night.  Not associated with nausea, vomiting, fevers, chills, changes in bowel or bladder habits.  Reports history of prior intra-abdominal abscesses related to his Crohn's, is concerned for same.       Home Medications Prior to Admission medications   Medication Sig Start Date End Date Taking? Authorizing Provider  HYDROcodone-acetaminophen (NORCO/VICODIN) 5-325 MG tablet Take 1 tablet by mouth every 4 (four) hours as needed. 04/03/22  Yes Tacy Learn, PA-C  ondansetron (ZOFRAN-ODT) 4 MG disintegrating tablet Take 1 tablet (4 mg total) by mouth every 8 (eight) hours as needed for nausea or vomiting. 04/03/22  Yes Tacy Learn, PA-C  tamsulosin (FLOMAX) 0.4 MG CAPS capsule Take 1 capsule (0.4 mg total) by mouth daily. 04/03/22  Yes Tacy Learn, PA-C  Upadacitinib ER (RINVOQ) 30 MG TB24 Take 1 tablet by mouth daily. Maintenance dose Patient taking differently: Take 1 tablet by mouth daily. 09/14/21  Yes Armbruster, Carlota Raspberry, MD      Allergies    Patient has no known allergies.    Review of Systems   Review of Systems Negative except as per HPI Physical Exam Updated Vital Signs BP 117/80   Pulse 74   Temp 98 F (36.7 C) (Oral)   Resp 18   Ht 6' (1.829 m)   Wt 104.3 kg   SpO2  98%   BMI 31.19 kg/m  Physical Exam Vitals and nursing note reviewed.  Constitutional:      General: He is not in acute distress.    Appearance: He is well-developed. He is not diaphoretic.  HENT:     Head: Normocephalic and atraumatic.  Cardiovascular:     Rate and Rhythm: Normal rate and regular rhythm.     Heart sounds: Normal heart sounds.  Pulmonary:     Effort: Pulmonary effort is normal.     Breath sounds: Normal breath sounds.  Abdominal:     Palpations: Abdomen is soft.     Tenderness: There is abdominal tenderness in the left lower quadrant. There is guarding. There is no right CVA tenderness, left CVA tenderness or rebound.  Musculoskeletal:     Right lower leg: No edema.     Left lower leg: No edema.  Skin:    General: Skin is warm and dry.     Findings: No erythema or rash.  Neurological:     Mental Status: He is alert and oriented to person, place, and time.  Psychiatric:        Behavior: Behavior normal.     ED Results / Procedures / Treatments   Labs (all labs ordered are listed, but only abnormal results are displayed) Labs Reviewed  BASIC METABOLIC PANEL - Abnormal; Notable  for the following components:      Result Value   Glucose, Bld 106 (*)    All other components within normal limits  URINALYSIS, ROUTINE W REFLEX MICROSCOPIC - Abnormal; Notable for the following components:   Hgb urine dipstick SMALL (*)    All other components within normal limits  CBC WITH DIFFERENTIAL/PLATELET    EKG None  Radiology CT Abdomen Pelvis W Contrast  Result Date: 04/03/2022 CLINICAL DATA:  History of Crohn's disease with left lower quadrant abdominal pain. Left flank pain after passing a 1 mm kidney stone 2 weeks ago. Microhematuria on urinalysis. EXAM: CT ABDOMEN AND PELVIS WITH CONTRAST TECHNIQUE: Multidetector CT imaging of the abdomen and pelvis was performed using the standard protocol following bolus administration of intravenous contrast. RADIATION DOSE  REDUCTION: This exam was performed according to the departmental dose-optimization program which includes automated exposure control, adjustment of the mA and/or kV according to patient size and/or use of iterative reconstruction technique. CONTRAST:  162m OMNIPAQUE IOHEXOL 300 MG/ML  SOLN COMPARISON:  CT with IV contrast 02/20/2020 FINDINGS: Lower chest: There is mild posterior atelectasis in the lower lobes lingular base. No significant lung base findings, with mild elevation again on the right hemidiaphragm. The cardiac size is normal. Hepatobiliary: The liver is mildly steatotic. There are small cysts in the left lobe. There is no solid mass enhancement. There are a few small stones in the proximal gallbladder but no wall thickening no biliary dilatation. Pancreas: No abnormality. Spleen: No abnormality.  No splenomegaly. Adrenals/Urinary Tract: There are 2 small subcentimeter cortical hypodensities in the right kidney. These are most likely cysts. No follow-up imaging is recommended ([facility]2018 Feb; 264-273, Management of the Incidental Renal Mass on CT, [employer] 2021; 814-848, Bosniak Classification of Cystic Renal Masses, Version 2019). Left kidney enhances homogeneously. There is no adrenal mass. No intrarenal stone is seen and no hydronephrosis or cortical contrast asymmetry. There is mild distal left ureterectasis above a 3 mm stone 1.7 cm proximal to the UVJ and just above the level of the ischial spines. The bladder wall and lumen are unremarkable and both ureters are otherwise clear. Stomach/Bowel: There are surgical changes of remote terminal ileocecectomy with a primary end to end anastomosis. No anastomotic breakdown is seen. More proximally, there are chronic thickened folds in the stomach, and today there are increased thickened folds in the duodenum and jejunum, without mesenteric inflammation. There are mildly dilated small bowel segments in the mid to lower abdomen up to 2.7 cm caliber,  relatively smaller caliber in the terminal ileal segments in the right lower abdomen but a focal small-bowel transition could not be found. Etiology could be ileus or a low-grade partial small bowel obstruction such as due to enteritis, adhesive disease or occult internal hernia. Vascular/Lymphatic: There are no significant vascular findings. A borderline prominent right lower quadrant mesenteric node is noted on 2:61 measuring 8 mm in short axis not seen previously. There are no enlarged abdominal or pelvic nodes or inguinal adenopathy. Reproductive: Prostate is unremarkable. Other: There is no free air, free fluid, free hemorrhage or acute inflammatory change. There are no incarcerated hernias. Musculoskeletal: There is a partially rim calcified central disc protrusion at L5-S1 abutting and slightly deforming both S1 nerve roots along their medial sheaths but there is no frank nerve root compression. The appearance suggests a subacute process but this was not present in 2021. No acute or significant osseous findings. IMPRESSION: 1. 3 mm stone in the distal left ureter 1.7  cm proximal to the UVJ, with mild distal ureterectasis but no hydronephrosis. 2. No intrarenal stones. 3. Mildly dilated small bowel segments in the mid to lower abdomen, with relatively smaller caliber in the terminal ileal segments in the right lower abdomen. Etiology could be ileus or a low-grade partial small bowel obstruction such as due to enteritis, adhesive disease or occult internal hernia. A focal transition could not be found. 4. Chronic thickened folds in the stomach and increased thickened folds in the duodenum and jejunum. No mesenteric inflammation. This could be due to enteritis or enteropathy. 5. Single borderline prominent right lower quadrant mesenteric node. 6. Cholelithiasis. 7. Mild hepatic steatosis. 8. Partially rim calcified central disc protrusion at L5-S1 abutting and slightly deforming both S1 nerve roots along their  medial sheaths but no frank nerve root compression. The appearance suggests a subacute process but this was not present in 2021. Electronically Signed   By: Telford Nab M.D.   On: 04/03/2022 06:13    Procedures Procedures    Medications Ordered in ED Medications  sodium chloride (PF) 0.9 % injection (has no administration in time range)  ketorolac (TORADOL) 15 MG/ML injection 15 mg (has no administration in time range)  sodium chloride 0.9 % bolus 1,000 mL (0 mLs Intravenous Stopped 04/03/22 0447)  iohexol (OMNIPAQUE) 300 MG/ML solution 100 mL (100 mLs Intravenous Contrast Given 04/03/22 0529)    ED Course/ Medical Decision Making/ A&P                             Medical Decision Making Amount and/or Complexity of Data Reviewed Labs: ordered. Radiology: ordered.  Risk Prescription drug management.   This patient presents to the ED for concern of left lower quadrant abdominal pain, this involves an extensive number of treatment options, and is a complaint that carries with it a high risk of complications and morbidity.  The differential diagnosis includes but not limited to ureterolithiasis, diverticulitis, colitis, Crohn's flare   Co morbidities that complicate the patient evaluation  Crohn's disease   Additional history obtained:  External records from outside source obtained and reviewed including CT from outside hospital dated 03/25/2022 revealing 78m left ureteral stone at the L4 level.   Lab Tests:  I Ordered, and personally interpreted labs.  The pertinent results include: Urinalysis with small hemoglobin, no evidence of infection.  CBC within normal notes.  BMP without significant findings.   Imaging Studies ordered:  I ordered imaging studies including CT abdomen pelvis with contrast I independently visualized and interpreted imaging which showed distal left ureteral stone I agree with the radiologist interpretation   Problem List / ED Course / Critical  interventions / Medication management  42year old male with history of Crohn's disease with history of prior complications with abscess presents with left lower quadrant pain.  Recent left ureteral stone which he believes he may have passed by now with concern for Crohn's flare causing left lower quadrant pain.  Does have guarding with left lower quadrant tenderness.  Labs reassuring.  CT with 3 mm distal left ureteral stone.  Additional findings however not consistent with presentation as patient does not have nausea, vomiting, changes in bowel or bladder habits.  Suspect patient's pain is due to his 3 mm distal left ureteral stone.  He is provided with Flomax, Zofran, Norco and referral to urology.  Advised to follow-up with GI regarding his CT report for recheck. I have reviewed the patients home  medicines and have made adjustments as needed   Social Determinants of Health:  Has PCP for follow-up care   Test / Admission - Considered:  Discharged to follow-up with urology outpatient with return to ER precautions provided.         Final Clinical Impression(s) / ED Diagnoses Final diagnoses:  Ureterolithiasis    Rx / DC Orders ED Discharge Orders          Ordered    ondansetron (ZOFRAN-ODT) 4 MG disintegrating tablet  Every 8 hours PRN        04/03/22 0628    tamsulosin (FLOMAX) 0.4 MG CAPS capsule  Daily        04/03/22 0628    HYDROcodone-acetaminophen (NORCO/VICODIN) 5-325 MG tablet  Every 4 hours PRN        04/03/22 0628              Tacy Learn, PA-C 04/03/22 0630    Horton, Barbette Hair, MD 04/03/22 2325

## 2022-04-03 NOTE — Discharge Instructions (Addendum)
Continue with Flomax daily. Zofran as needed as prescribed for nausea and vomiting. Norco as needed as prescribed for pain.  Do not drive or operate machinery while taking this medication.  This medication can cause constipation. Follow-up with urology, provided with referral, please call to schedule.  Recheck with your GI provider.  Return to ER for fevers, pain or vomiting not controlled with medications or other concerning symptoms.

## 2022-04-26 ENCOUNTER — Telehealth: Payer: Self-pay

## 2022-04-26 ENCOUNTER — Other Ambulatory Visit: Payer: Self-pay

## 2022-04-26 ENCOUNTER — Ambulatory Visit (HOSPITAL_COMMUNITY)
Admission: EM | Admit: 2022-04-26 | Discharge: 2022-04-26 | Disposition: A | Discharge status: Home / Self Care | Payer: BC Managed Care – PPO

## 2022-04-26 ENCOUNTER — Emergency Department (HOSPITAL_COMMUNITY)
Admission: EM | Admit: 2022-04-26 | Discharge: 2022-04-26 | Disposition: A | Discharge status: Home / Self Care | Payer: BC Managed Care – PPO | Attending: Emergency Medicine | Admitting: Emergency Medicine

## 2022-04-26 DIAGNOSIS — Z1152 Encounter for screening for COVID-19: Secondary | ICD-10-CM | POA: Insufficient documentation

## 2022-04-26 DIAGNOSIS — K50819 Crohn's disease of both small and large intestine with unspecified complications: Secondary | ICD-10-CM

## 2022-04-26 DIAGNOSIS — R45851 Suicidal ideations: Secondary | ICD-10-CM | POA: Insufficient documentation

## 2022-04-26 DIAGNOSIS — F321 Major depressive disorder, single episode, moderate: Secondary | ICD-10-CM

## 2022-04-26 DIAGNOSIS — Z726 Gambling and betting: Secondary | ICD-10-CM

## 2022-04-26 DIAGNOSIS — F329 Major depressive disorder, single episode, unspecified: Secondary | ICD-10-CM | POA: Diagnosis present

## 2022-04-26 LAB — COMPREHENSIVE METABOLIC PANEL
ALT: 24 U/L (ref 0–44)
AST: 28 U/L (ref 15–41)
Albumin: 4.3 g/dL (ref 3.5–5.0)
Alkaline Phosphatase: 62 U/L (ref 38–126)
Anion gap: 7 (ref 5–15)
BUN: 13 mg/dL (ref 6–20)
CO2: 26 mmol/L (ref 22–32)
Calcium: 9 mg/dL (ref 8.9–10.3)
Chloride: 107 mmol/L (ref 98–111)
Creatinine, Ser: 1.17 mg/dL (ref 0.61–1.24)
GFR, Estimated: >60 mL/min (ref >60)
Glucose, Bld: 151 mg/dL — ABNORMAL HIGH (ref 70–99)
Potassium: 3.8 mmol/L (ref 3.5–5.1)
Sodium: 140 mmol/L (ref 135–145)
Total Bilirubin: 0.8 mg/dL (ref 0.3–1.2)
Total Protein: 8.7 g/dL — ABNORMAL HIGH (ref 6.5–8.1)

## 2022-04-26 LAB — CBC WITH DIFFERENTIAL/PLATELET
Abs Immature Granulocytes: 0.03 10*3/uL (ref 0.00–0.07)
Basophils Absolute: 0 10*3/uL (ref 0.0–0.1)
Basophils Relative: 1 %
Eosinophils Absolute: 0.1 10*3/uL (ref 0.0–0.5)
Eosinophils Relative: 1 %
HCT: 43.4 % (ref 39.0–52.0)
Hemoglobin: 14.2 g/dL (ref 13.0–17.0)
Immature Granulocytes: 0 %
Lymphocytes Relative: 15 %
Lymphs Abs: 1.4 10*3/uL (ref 0.7–4.0)
MCH: 28.4 pg (ref 26.0–34.0)
MCHC: 32.7 g/dL (ref 30.0–36.0)
MCV: 86.8 fL (ref 80.0–100.0)
Monocytes Absolute: 0.7 10*3/uL (ref 0.1–1.0)
Monocytes Relative: 8 %
Neutro Abs: 6.6 10*3/uL (ref 1.7–7.7)
Neutrophils Relative %: 75 %
Platelets: 301 10*3/uL (ref 150–400)
RBC: 5 MIL/uL (ref 4.22–5.81)
RDW: 13 % (ref 11.5–15.5)
WBC: 8.8 10*3/uL (ref 4.0–10.5)
nRBC: 0 % (ref 0.0–0.2)

## 2022-04-26 LAB — RAPID URINE DRUG SCREEN, HOSP PERFORMED
Amphetamines: NOT DETECTED
Barbiturates: NOT DETECTED
Benzodiazepines: NOT DETECTED
Cocaine: NOT DETECTED
Opiates: NOT DETECTED
Tetrahydrocannabinol: NOT DETECTED

## 2022-04-26 LAB — RESP PANEL BY RT-PCR (RSV, FLU A&B, COVID)  RVPGX2
Influenza A by PCR: NEGATIVE
Influenza B by PCR: NEGATIVE
Resp Syncytial Virus by PCR: NEGATIVE
SARS Coronavirus 2 by RT PCR: NEGATIVE

## 2022-04-26 LAB — ACETAMINOPHEN LEVEL: Acetaminophen (Tylenol), Serum: <10 ug/mL — ABNORMAL LOW (ref 10–30)

## 2022-04-26 LAB — SALICYLATE LEVEL: Salicylate Lvl: <7 mg/dL — ABNORMAL LOW (ref 7.0–30.0)

## 2022-04-26 LAB — ETHANOL: Alcohol, Ethyl (B): <10 mg/dL (ref <10)

## 2022-04-26 MED ORDER — RINVOQ 30 MG PO TB24: 1.0000 | ORAL_TABLET | Freq: Every day | ORAL | 2 refills | Status: DC

## 2022-04-26 NOTE — ED Provider Notes (Signed)
Behavioral Health Urgent Care Medical Screening Exam  Patient Name: Larry [name] MRN: [id number] Date of Evaluation: 04/26/22 Chief Complaint:   Diagnosis:  Final diagnoses:  Gambling problem    History of Present illness: Larry [name] is a 42 y.o. male. Patient presents voluntarily to [facility] behavioral health for walk-in assessment. He was briefly evaluated at  [facility] emergency department earlier this date, suggested [facility] evaluation.  Patient is assessed, face-to-face, by nurse practitioner. He is seated in assessment area, no acute distress. Consulted with provider, Dr.  Dwyane Dee, and chart reviewed on 04/26/2022. He is alert and oriented, pleasant and cooperative during assessment.   Patient states "I am looking for help, I have been feeling anxious and depressed, I am gambling and into debt.  I got paid yesterday and even though I had plan to use that money for other things I wound up gambling away my entire paycheck."  Patient has gambled losing approximately $50,000 over the last 2 years.  He reports he began gambling at age 66.  Patient resides in [place].  He was briefly seen earlier today at [facility] in [place].  He has an upcoming individual therapy appointment scheduled for 05/07/2022.  He would prefer intensive outpatient or partial hospitalization.  He states "I would like some structure in my life, I would like to be seen more frequently.  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  He is not linked with outpatient psychiatry at this time.  He has been prescribed Strattera by primary care provider in the past.  No history of inpatient psychiatric hospitalization denies family mental health history.  Patient  presents with anxious mood,  congruent affect. He  denies suicidal and homicidal ideations. Denies history of suicide attempts, denies history of self-harm.  Patient easily  contracts verbally for safety with this Probation officer.    Patient has normal speech and behavior.  He  denies auditory and visual hallucinations.  Patient is able to converse coherently with goal-directed thoughts and no distractibility or preoccupation.  Denies symptoms of paranoia.  Objectively there is no evidence of psychosis/mania or delusional thinking.  Larry [name] resides in [place].  He reports supportive family and friends.  Patient has a 92-year-old son who resides with his mother.  He denies access weapons. He is employed as a Furniture conservator/restorer. Patient endorses average sleep and appetite.He denies alcohol and substance use.  Patient offered support and encouragement.    Patient educated and verbalizes understanding of mental health resources and other crisis services in the community. They are instructed to call 911 and present to the nearest emergency room should patient experience any suicidal/homicidal ideation, auditory/visual/hallucinations, or detrimental worsening of mental health condition.      [place] ED from 04/26/2022 in [facility] ED from 04/03/2022 in [facility] Emergency Department at [place] Risk No Risk       Psychiatric Specialty Exam  Presentation  General Appearance:Appropriate for Environment; Casual  Eye Contact:Good  Speech:Clear and Coherent; Normal Rate  Speech Volume:Normal  Handedness:Right   Mood and Affect  Mood: Anxious  Affect: Appropriate; Congruent   Thought Process  Thought Processes: Coherent; Goal Directed; Linear  Descriptions of Associations:Intact  Orientation:Full (Time, Place and  Person)  Thought Content:Logical    Hallucinations:None  Ideas of Reference:None  Suicidal Thoughts:No  Homicidal  Thoughts:No   Sensorium  Memory: Immediate Good; Recent Good  Judgment: Fair  Insight: Fair   Executive Functions  Concentration: Good  Attention Span: Good  Recall: Good  Fund of Knowledge: Good  Language: Good   Psychomotor Activity  Psychomotor Activity: Normal   Assets  Assets: Communication Skills; Desire for Improvement; Financial Resources/Insurance; Housing; Leisure Time; Physical Health; Resilience; Social Support   Sleep  Sleep: Good  Number of hours: No data recorded  Physical Exam: Physical Exam Vitals and nursing note reviewed.  Constitutional:      Appearance: Normal appearance. He is well-developed and normal weight.  HENT:     Head: Normocephalic and atraumatic.     Nose: Nose normal.  Cardiovascular:     Rate and Rhythm: Normal rate.  Pulmonary:     Effort: Pulmonary effort is normal.  Musculoskeletal:        General: Normal range of motion.  Skin:    General: Skin is warm and dry.  Neurological:     Mental Status: He is alert and oriented to person, place, and time.  Psychiatric:        Attention and Perception: Attention and perception normal.        Mood and Affect: Affect normal. Mood is anxious.        Speech: Speech normal.        Behavior: Behavior normal. Behavior is cooperative.        Thought Content: Thought content normal.        Cognition and Memory: Cognition and memory normal.    Review of Systems  Constitutional: Negative.   HENT: Negative.    Eyes: Negative.   Respiratory: Negative.    Cardiovascular: Negative.   Gastrointestinal: Negative.   Genitourinary: Negative.   Musculoskeletal: Negative.   Skin: Negative.   Neurological: Negative.   Psychiatric/Behavioral:  The patient is nervous/anxious.    Blood pressure 121/82, pulse 100, temperature 97.9 F (36.6 C), temperature source Oral, resp. rate 20, SpO2 97 %. There is no height or weight on file to calculate BMI.  Musculoskeletal: Strength  & Muscle Tone: within normal limits Gait & Station: normal Patient leans: N/A   [place] M[facility] Discharge Disposition for Follow up and Recommendations: Based on my evaluation the patient does not appear to have an emergency medical condition and can be discharged with resources and follow up care in outpatient services for Medication Management, Partial Hospitalization Program, and Individual Therapy Follow-up with outpatient mental health resources provided.  Referral has been initiated on your behalf for partial hospitalization program through [facility] behavioral health.  Lucky Rathke, FNP 04/26/2022, 7:05 PM

## 2022-04-26 NOTE — Progress Notes (Signed)
   04/26/22 1759  [place] (Walk-ins at [facility] only)  How Did You Hear About [place]? Hospital Discharge ([place])  What Is the Reason for Your Visit/Call Today? Patient presents to the [facility] from the [facility] ED.  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  Patient states that he is out of control with the spending of his money and he has been gambling.  Patient states that he is depressed and states that it has been going on for years and he states that he has been self-medicating by spending money.  He states that he got paid yesterday and he spent his whole paycheck yesterday gambling his money.  He states that he is behind on his bills because of these behaviors.  Patient states that he is not suicidal today, but states that he has experienced suicidal thoughts in the last month.  He states that he has thoughts to end his life when he is overwhelmed by his finances.  He states that he did not have a plan. He denies HI/psychosis.  Patient denies drug and alcohol use.  Patient states that his sleep and appetite are good.  Patient states that he would like to get therapy and medications to help him to deal with these issues.  How Long Has This Been Causing You Problems? > than 6 months  Have You Recently Had Any Thoughts About Hurting Yourself? Yes  How long ago did you have thoughts about hurting yourself? thoughts in the past month, but no plan or intent  Are You Planning to [place] At This time? No  Have you Recently Had Thoughts About [place]? No  Are You Planning To Harm Someone At This Time? No  Are you currently experiencing any auditory, visual or other hallucinations? No  Have You Used Any Alcohol or Drugs in the Past 24 Hours? No  Do you have any current medical co-morbidities that require immediate attention? No  Clinician description of patient physical appearance/behavior: clean and neat, cooperative  What Do You Feel  Would Help You the Most Today? Treatment for Depression or other mood problem  If access to [facility] Urgent Care was not available, would you have sought care in the Emergency Department? Yes  Determination of Need Routine (7 days)  Options For Referral Medication Management;Intensive Outpatient Therapy

## 2022-04-26 NOTE — Discharge Instructions (Signed)
Thank you for letting us take care of you today.  Your blood work, urine, EKG, and remainder of your workup and physical exam are reassuring. As we discussed, I think you would greatly benefit from being evaluated at the local behavioral health urgent care where a mental health provider can see you more promptly than in the ED setting and initiate your care. Please go directly to [facility] at the address provided and tell them that you were seen in the emergency department and medically cleared for further psychiatric evaluation and treatment.  If you develop thoughts of wanting to harm yourself or others, confusion, seeing or hearing things that are not there, chest pain, shortness of breath, or any other new, concerning symptoms, please return to nearest emergency department immediately.

## 2022-04-26 NOTE — ED Triage Notes (Signed)
Pt reports he has been feeling depressed and in "self destructive" with his finances.  Feels "I'm not able to do anything right"  Pt states he is from [place] and went to [facility] for an appt in a couple of weeks with a therapist.  Feels however he is not able to function in his everyday life right now and would like to see about "inpatient treatment"

## 2022-04-26 NOTE — Discharge Instructions (Addendum)
Base on the information you have provided and the presenting issue, outpatient services and resources for have been recommended.  It is imperative that you follow through with treatment recommendations within 5-7 days from the of discharge to mitigate further risk to your safety and mental well-being. A list of referrals has been provided below to get you started.  You are not limited to the list provided.  In case of an urgent crisis, you may contact the Mobile Crisis Unit with [facility] at [phone].  Patient has been referred to the [facility] PHP (Partial Hospitalization Program). A PHP representative will follow up with a call to the patient tomorrow to set up an assessment.  The contact person is Moses Manners.    [facility] [place], [place], [address] [phone] phone  Patient is instructed prior to discharge to:  Take all medications as prescribed by his/her mental healthcare provider. Report any adverse effects and or reactions from the medicines to his/her outpatient provider promptly. Keep all scheduled appointments, to ensure that you are getting refills on time and to avoid any interruption in your medication.  If you are unable to keep an appointment call to reschedule.  Be sure to follow-up with resources and follow-up appointments provided.  Patient has been instructed & cautioned: To not engage in alcohol and or illegal drug use while on prescription medicines. In the event of worsening symptoms, patient is instructed to call the crisis hotline, 911 and or go to the nearest ED for appropriate evaluation and treatment of symptoms. To follow-up with his/her primary care provider for your other medical issues, concerns and or health care needs.  Information: -National Suicide Prevention Lifeline 1-800-SUICIDE or 209-596-0884.  -988 offers 24/7 access to trained crisis counselors who can help people experiencing mental  health-related distress. People can call or text 988 or chat 988lifeline.org for themselves or if they are worried about a loved one who may need crisis support.

## 2022-04-26 NOTE — ED Provider Notes (Signed)
[facility] Provider Note   CSN: [id number] Arrival date & time: 04/26/22  1502     History  Chief Complaint  Patient presents with   Mental Health Problem    Larry [name] is a 42 y.o. male with past medical history of Crohn's disease who presents to the ED complaining of feelings of depression, intermittent suicidal ideation, and hopelessness over approximately the past month.  Patient reports that he is "addicted" to spending and it has caused him much financial distress.  He reports that he has had issues with depression in the past but has never been on any medication for this and has had no prior psychiatric hospitalizations.  He states that he has thoughts of suicide when he begins to feel overwhelmed and like his situation cannot be repaired but has never acted on these thoughts and has no suicide plan.  He does have a history of ADHD for which she intermittently takes Strattera.  He states that he attempted to seek help at the [facility] in [place] which is located close to where he is from that they were only able to set him up for outpatient counseling which was several weeks away from being started.  He would like to seek inpatient mental health treatment.[name] He denies alcohol or drug use. He denies homicidal/violent ideation or auditory/visual hallucinations.       Home Medications Prior to Admission medications   Medication Sig Start Date End Date Taking? Authorizing Provider  HYDROcodone-acetaminophen (NORCO/VICODIN) 5-325 MG tablet Take 1 tablet by mouth every 4 (four) hours as needed. 04/03/22   Tacy Learn, PA-C  ondansetron (ZOFRAN-ODT) 4 MG disintegrating tablet Take 1 tablet (4 mg total) by mouth every 8 (eight) hours as needed for nausea or vomiting. 04/03/22   Tacy Learn, PA-C  tamsulosin (FLOMAX) 0.4 MG CAPS capsule Take 1 capsule (0.4 mg total) by mouth daily. 04/03/22   Tacy Learn, PA-C   Upadacitinib ER (RINVOQ) 30 MG TB24 Take 1 tablet (30 mg total) by mouth daily. 04/26/22   Armbruster, Carlota Raspberry, MD      Allergies    Patient has no known allergies.    Review of Systems   Review of Systems  All other systems reviewed and are negative.   Physical Exam Updated Vital Signs BP 130/88   Pulse (!) 102   Temp 98 F (36.7 C)   Resp 18   SpO2 99%  Physical Exam Vitals and nursing note reviewed.  Constitutional:      General: He is not in acute distress.    Appearance: Normal appearance. He is not toxic-appearing.  HENT:     Head: Normocephalic and atraumatic.     Nose: Nose normal.     Mouth/Throat:     Mouth: Mucous membranes are moist.  Eyes:     General: No scleral icterus.    Extraocular Movements: Extraocular movements intact.     Conjunctiva/sclera: Conjunctivae normal.  Cardiovascular:     Rate and Rhythm: Normal rate and regular rhythm.     Heart sounds: Normal heart sounds. No murmur heard. Pulmonary:     Effort: Pulmonary effort is normal.     Breath sounds: Normal breath sounds.  Abdominal:     General: Abdomen is flat. There is no distension.     Palpations: Abdomen is soft.     Tenderness: There is no abdominal tenderness. There is no guarding or rebound.  Musculoskeletal:  General: Normal range of motion.     Cervical back: Normal range of motion and neck supple. No rigidity.     Right lower leg: No edema.     Left lower leg: No edema.  Skin:    General: Skin is warm and dry.     Capillary Refill: Capillary refill takes less than 2 seconds.     Coloration: Skin is not jaundiced or pale.  Neurological:     Mental Status: He is alert and oriented to person, place, and time.     GCS: GCS eye subscore is 4. GCS verbal subscore is 5. GCS motor subscore is 6.     Cranial Nerves: Cranial nerves 2-12 are intact.     Motor: Motor function is intact.  Psychiatric:        Attention and Perception: Attention normal. He does not perceive  auditory or visual hallucinations.        Mood and Affect: Mood is anxious and depressed. Mood is not elated. Affect is flat. Affect is not blunt, angry or inappropriate.        Speech: He is communicative. Speech is delayed (slightly). Speech is not rapid and pressured, slurred or tangential.        Behavior: Behavior is slowed and withdrawn. Behavior is not agitated, aggressive, hyperactive or combative. Behavior is cooperative.        Thought Content: Thought content is not paranoid or delusional. Thought content includes suicidal (passive SI recently but not currently) ideation. Thought content does not include homicidal ideation. Thought content does not include homicidal or suicidal plan.        Cognition and Memory: Cognition normal.        Judgment: Judgment normal.     ED Results / Procedures / Treatments   Labs (all labs ordered are listed, but only abnormal results are displayed) Labs Reviewed  COMPREHENSIVE METABOLIC PANEL - Abnormal; Notable for the following components:      Result Value   Glucose, Bld 151 (*)    Total Protein 8.7 (*)    All other components within normal limits  SALICYLATE LEVEL - Abnormal; Notable for the following components:   Salicylate Lvl [id number] (*)    All other components within normal limits  ACETAMINOPHEN LEVEL - Abnormal; Notable for the following components:   Acetaminophen (Tylenol), Serum <10 (*)    All other components within normal limits  RESP PANEL BY RT-PCR (RSV, FLU A&B, COVID)  [id number]  ETHANOL  RAPID URINE DRUG SCREEN, HOSP PERFORMED  CBC WITH DIFFERENTIAL/PLATELET    EKG EKG Interpretation  Date/Time:  Thursday April 26 2022 16:37:31 EST Ventricular Rate:  86 PR Interval:  144 QRS Duration: 85 QT Interval:  350 QTC Calculation: 419 R Axis:   52 Text Interpretation: Sinus rhythm Borderline T wave abnormalities Confirmed by Nanda Quinton [phone]) on 04/26/2022 4:43:08 PM  Radiology No results found.  Procedures Procedures    Medications Ordered in ED Medications - No data to display  ED Course/ Medical Decision Making/ A&P                           Medical Decision Making Amount and/or Complexity of Data Reviewed Labs: ordered. Decision-making details documented in ED Course. ECG/medicine tests: ordered. Decision-making details documented in ED Course.  Risk Decision regarding hospitalization.   Medical Decision Making:   Scott Chessman is a 42 y.o. male who presented to the ED today with depression and  suicidal ideation detailed above.    Patient's presentation is complicated by their history of depressive and suicidal thoughts.  Complete initial physical exam performed, notably the patient was in no acute distress with benign heart, lung, and abdominal exams but with flat affect, slightly withdrawn, and expressing thoughts of depression and passive suicidal ideation recently but with no plan or intent. Pt was cooperative, not agitated, and alert, oriented, and answering questions appropriately. Neurologically intact.  Reviewed and confirmed nursing documentation for past medical history, family history, social history.    Initial Assessment:   With the patient's presentation of depression, most likely diagnosis is major depressive disorder. Other diagnoses were considered including (but not limited to) bipolar disorder, schizophrenia, schizoaffective disorder, generalized anxiety disorder, alcohol use disorder, substance use disorder, personality disorder, overdose, metabolic derangement, arrhythmia, anemia, electrolyte disturbance. These are considered less likely due to history of present illness and physical exam findings.   This is most consistent with an acute complicated illness  Initial Plan:  Screening labs including CBC and Metabolic panel to evaluate for infectious or metabolic etiology of disease.  UDS and alcohol level to evaluate for polysubstance use Salicylate and acetaminophen levels to  evaluate for overdose EKG to evaluate for cardiac pathology Objective evaluation as reviewed   Initial Study Results:   Laboratory  All laboratory results reviewed without evidence of clinically relevant pathology.    EKG EKG was reviewed independently. Rate, rhythm, axis, intervals all examined and without medically relevant abnormality. ST segments without concerns for elevations.    Final Assessment and Plan:   This is a 42 year old male presenting to ED c/o depression and suicidal ideation. Pt reports financial stressors and would like to seek psychiatric help. Pt from [place] and attempted to arrange outpatient follow up but office where he presented unable to arrange this for several weeks. Pt has history of ADHD on Strattera irregularly but has never been on anti-depressants or anti-anxiety medication or been diagnosed with other psychiatric conformities in the past. On exam, pt does appear slightly withdrawn with flat affect and expressing past passive suicidal ideation but states he has no current or active SI at this time and has never had a plan or intent. He denies HI, AVH, or other complaints. He is neurologically intact and answering questions appropriately with otherwise benign physical exam and stable vital signs. Medical clearance workup obtained without organic cause identified of pt's symptoms. Suspect pt dealing with underlying psychiatric process that would benefit from outpatient management. Discussed pt case with attending MD and as pt is not actively suicidal, homicidal, or showing signs of psychiatric or medical emergency, we believe he would strongly benefit from further evaluation at behavioral health urgent care due to current emergency department delays in receiving psychiatric care. Pt is medically cleared and stable for psychiatric disposition. He appears reliable and eager to seek help with no indication for IVC at this time. Discussed this option with pt and he expresses  desire to be discharged to go to [facility] for more prompt care. Pt states he can safely transport himself there and will follow up immediately upon departure from emergency department. Pt again denies active SI, HI, or AVH. Pt given strict ED return precautions and stable for discharge from ED to [facility] for further psychiatric care.     Clinical Impression:  1. Current moderate episode of major depressive disorder, unspecified whether recurrent ([place])   2. Suicidal ideation      Discharge     Final  Clinical Impression(s) / ED Diagnoses Final diagnoses:  Suicidal ideation  Current moderate episode of major depressive disorder, unspecified whether recurrent [facility])    Rx / DC Orders ED Discharge Orders     None         Suzzette Righter, PA-C 04/27/22 1423    Blanchie Dessert, MD 04/30/22 [phone]

## 2022-04-26 NOTE — Telephone Encounter (Signed)
Received a refill request from [facility] for Rinvoq 30 mg prescription. Refill has been sent #30 with 2 additional refills. Pt has a routine follow up appt on Thursday, 06/14/22 at 9:50 am.

## 2022-04-27 ENCOUNTER — Telehealth (HOSPITAL_COMMUNITY): Payer: Self-pay | Admitting: Licensed Clinical Social Worker

## 2022-04-27 NOTE — Telephone Encounter (Signed)
Encounter opened in error

## 2022-04-27 NOTE — Telephone Encounter (Signed)
Cln returned call and confirmed that pt lives in [place] and is not currently staying in [place]. Pt reported this is correct and stated he came to [facility] because of more available resources. Cln explained that pt cannot be seen in the virtual PHP at [facility] outpt if he is not physically in [place] because the therapists are only licensed in [place]. Cln recommended pt contact [facility], as it is in-person and contact info was provided on pt's AVS from [facility]. Pt verbalized understanding.

## 2022-06-14 ENCOUNTER — Ambulatory Visit (INDEPENDENT_AMBULATORY_CARE_PROVIDER_SITE_OTHER): Payer: BC Managed Care – PPO | Admitting: Gastroenterology

## 2022-06-14 ENCOUNTER — Other Ambulatory Visit: Payer: BC Managed Care – PPO

## 2022-06-14 ENCOUNTER — Encounter: Payer: Self-pay | Admitting: Gastroenterology

## 2022-06-14 VITALS — BP 94/60 | HR 80 | Ht 72.0 in | Wt 227.5 lb

## 2022-06-14 DIAGNOSIS — K50819 Crohn's disease of both small and large intestine with unspecified complications: Secondary | ICD-10-CM

## 2022-06-14 DIAGNOSIS — Z79899 Other long term (current) drug therapy: Secondary | ICD-10-CM

## 2022-06-14 NOTE — Patient Instructions (Addendum)
Please go to the lab in the basement of our building to have lab work done as you leave today. Hit "B" for basement when you get on the elevator.  When the doors open the lab is on your left.  We will call you with the results. Thank you.   Continue RINVOQ.  Please follow up in 6 months.  Thank you for entrusting me with your care and for choosing [facility], Dr. Ileene Patrick    If your blood pressure at your visit was 140/90 or greater, please contact your primary care physician to follow up on this.  _______________________________________________________  If you are age 40 or older, your body mass index should be between 23-30. Your Body mass index is 30.85 kg/m. If this is out of the aforementioned range listed, please consider follow up with your Primary Care Provider.  If you are age 39 or younger, your body mass index should be between 19-25. Your Body mass index is 30.85 kg/m. If this is out of the aformentioned range listed, please consider follow up with your Primary Care Provider.   ________________________________________________________  The [facility] providers would like to encourage you to use [facility] to communicate with providers for non-urgent requests or questions.  Due to long hold times on the telephone, sending your provider a message by [facility] may be a faster and more efficient way to get a response.  Please allow 48 business hours for a response.  Please remember that this is for non-urgent requests.  _______________________________________________________  Due to recent changes in healthcare laws, you may see the results of your imaging and laboratory studies on MyChart before your provider has had a chance to review them.  We understand that in some cases there may be results that are confusing or concerning to you. Not all laboratory results come back in the same time frame and the provider may be waiting for multiple results in order to interpret  others.  Please give [place] 48 hours in order for your provider to thoroughly review all the results before contacting the office for clarification of your results.

## 2022-06-14 NOTE — Progress Notes (Signed)
HPI :  Crohn's history: Ileocolonic Crohn's disease diagnosed in 2015. He presented initially with spontaneous abscess which required drainage. He was followed in [place], [place] and maintained on Imuran 100mg  daily for 3 years or so, and then presented to [facility] hospital with another spontaneous perforation with multiple retroperitoneal abscess in Jan 2018. He was treated with bowel rest and TPN and antibiotics, IR was able to drain one of the abscesses at the time. He was seen by Colorectal surgery at [facility] (Dr. Drue Dun) after his initial hospitalization, and he declined surgery at the time as he was feeling better. Unfortunately he failed conservative managemenet and had recurrence of abscess formation, was admitted at [facility] again, and then ultimately transitioned to Dr. Drue Dun of [facility] and had definitive treatment with surgery.    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 Post-operatively he was started on Humira and methotrexate.   Did well for some time on Humira / MTX, unfortunately had active disease on colonoscopy March 2023 - had subtherapeutic level of Humira with rising antibody titer despite methotrexate. Switched to Rinvoq June 2023.   SINCE LAST VISIT:   42 year old male here for follow-up visit.  I have not seen him since last June.  Recall he had active disease on his colonoscopy with some recurrent abdominal pain and intermittent symptoms.  He had subtherapeutic Humira level with an intermediate antibody titer while on methotrexate.  We elected to stop that regimen and switch him to Rinvoq.  He completed 12 weeks of Rinvoq dosed at 45 mg daily and then has been on maintenance therapy of 30 mg daily since that time.  He has been really happy with the regimen.  He denies any side effects that bother him.  He was able to get the Shingrix vaccine [facility] and states he completed the  series.  Overall he feels it is working quite well for him.  He states he previously had some intermittent abdominal pains while he was on Humira, he has not really had any pains at all that have bothered him since being on Rinvoq.  He denies any problems with his bowels.  He denies any blood in his stools.  He has not any flares of disease since then.  He is eating well, weight is stable, no weight loss.  He states he is now on Vyvanse for his ADHD, states he has had some issues with that and a gambling problem since have last seen him.  His last TB testing was about a year ago.  He had labs in the ED while dealing with a renal stone at the end of February of this year.  CBC was normal, no anemia or leukocytosis.  Liver function testing was normal.  While he was being evaluated for renal stone in February he had a CT scan.  This showed a 3 mm stone in his distal ureter.  Incidentally he had some mildly dilated small bowel segments in the lower abdomen, he also had some chronic thickened folds of the stomach and duodenum and jejunum without any active inflammation.     IBD Health Care Maintenance: Annual Flu Vaccine - UTD Pneumococcal Vaccine : - 2018 [facility] hospital TB testing  QG negative 3/23 Vitamin D screening - Date: deficient - on supplementation Last Colonoscopy 3/23 - mild stenosis / ulceration at anastomosis, active left sided colitis Shingrix - 2023 completed     Colonoscopy  05/23/21: The perianal and digital rectal examinations were normal. - There was evidence of a prior end-to-end ileo-colonic anastomosis in the ascending colon. This was patent and was characterized by a a very mild stenosis with one focal small ulceration ( perhaps post surgical / ischemic) - The terminal ileum appeared normal. - Patchy mild inflammation characterized by loss of vascularity was found in the rectum, in the sigmoid colon and in the descending colon. Very subtle in portions of sigmoid and descending  colon, most prominent in the rectum. Biopsies were taken with a cold forceps for histology. - The exam was otherwise without abnormality.   1. Surgical [P], left colon - MILD TO MODERATELY ACTIVE CHRONIC COLITIS. - NEGATIVE FOR DYSPLASIA. 2. Surgical [P], colon, rectum - MILDLY ACTIVE CHRONIC COLITIS. - NEGATIVE FOR DYSPLASIA.   Labs 07/10/21 Humira level of 3.5 Antibody level of 113 - despite being on MTX   QG negative 05/23/21 Iron and B12 okay on 05/24/11 Vitamin D 19.49   CT scan 04/03/22: IMPRESSION: 1. 3 mm stone in the distal left ureter 1.7 cm proximal to the UVJ, with mild distal ureterectasis but no hydronephrosis. 2. No intrarenal stones. 3. Mildly dilated small bowel segments in the mid to lower abdomen, with relatively smaller caliber in the terminal ileal segments in the right lower abdomen. Etiology could be ileus or a low-grade partial small bowel obstruction such as due to enteritis, adhesive disease or occult internal hernia. A focal transition could not be found. 4. Chronic thickened folds in the stomach and increased thickened folds in the duodenum and jejunum. No mesenteric inflammation. This could be due to enteritis or enteropathy. 5. Single borderline prominent right lower quadrant mesenteric node. 6. Cholelithiasis. 7. Mild hepatic steatosis. 8. Partially rim calcified central disc protrusion at L5-S1 abutting and slightly deforming both S1 nerve roots along their medial sheaths but no frank nerve root compression. The appearance suggests a subacute process but this was not present in 2021.    Past Medical History:  Diagnosis Date   Crohn's disease    Vitamin D deficiency      Past Surgical History:  Procedure Laterality Date   abcess removal     2015, 02-2016 abcess in colon   bowel abscess from crohns      COLONOSCOPY     IR GENERIC HISTORICAL  04/05/2016   IR RADIOLOGIST EVAL & MGMT 04/05/2016 Simonne Come, MD [facility] INTERV RAD   LAPAROSCOPIC  ILEOCECECTOMY  05/14/2016   with debridement of chronic abscess cavity asnd takedown of fistulas   Family History  Problem Relation Age of Onset   Stomach cancer Neg Hx    Colon cancer Neg Hx    Colon polyps Neg Hx    Esophageal cancer Neg Hx    Rectal cancer Neg Hx    Social History   Tobacco Use   Smoking status: Never   Smokeless tobacco: Never  Vaping Use   Vaping Use: Never used  Substance Use Topics   Alcohol use: No   Drug use: No   Current Outpatient Medications  Medication Sig Dispense Refill   acetaminophen (TYLENOL) 325 MG tablet Take 650 mg by mouth as needed.     sertraline (ZOLOFT) 50 MG tablet Take 50 mg by mouth daily.     Upadacitinib ER (RINVOQ) 30 MG TB24 Take 1 tablet (30 mg total) by mouth daily. 30 tablet 2   VYVANSE 50 MG capsule Take 50 mg by mouth daily.     Current  Facility-Administered Medications  Medication Dose Route Frequency Provider Last Rate Last Admin   0.9 %  sodium chloride infusion  500 mL Intravenous Once Immaculate Crutcher, Willaim Rayas, MD       No Known Allergies   Review of Systems: All systems reviewed and negative except where noted in HPI.   Lab Results  Component Value Date   WBC 8.8 04/26/2022   HGB 14.2 04/26/2022   HCT 43.4 04/26/2022   MCV 86.8 04/26/2022   PLT 301 04/26/2022   Lab Results  Component Value Date   CREATININE 1.17 04/26/2022   BUN 13 04/26/2022   NA 140 04/26/2022   K 3.8 04/26/2022   CL 107 04/26/2022   CO2 26 04/26/2022   Lab Results  Component Value Date   ALT 24 04/26/2022   AST 28 04/26/2022   ALKPHOS 62 04/26/2022   BILITOT 0.8 04/26/2022     Physical Exam: BP 94/60 (BP Location: Left Arm, Patient Position: Sitting, Cuff Size: Normal)   Pulse 80   Ht 6' (1.829 m)   Wt 227 lb 8 oz (103.2 kg)   BMI 30.85 kg/m  Constitutional: Pleasant,well-developed, male in no acute distress. Neurological: Alert and oriented to person place and time. Psychiatric: Normal mood and affect. Behavior is  normal.   ASSESSMENT: 42 y.o. male here for assessment of the following  1. Crohn's disease of small and large intestines with complication   2. High risk medication use    As above, failed Humira and methotrexate, has been on Rinvoq since June 2023 and doing really well.  He tolerates the regimen, clinically is in remission without any breakthrough symptoms, weight stable, labs stable.  He had a lipid panel after 12 weeks which was normal.  Overall very pleased with his course on the regimen.  We discussed long-term risks of it, he understands, he has completed his Shingrix vaccine from what he tells me today.  He will continue 30 mg/day of Rinvoq for now.  We discussed that his last colonoscopy was over a year ago.  I think it is reasonable to perform another colonoscopy to restage disease now that he has had a major change in his therapy.  At the same time I do recommend an EGD to evaluate his stomach and small intestine in light of the recent CT scan that was done in the ED, evaluate for upper tract Crohn's.  We discussed with these exams entailed.  He is interested in doing this however, does not think he can do it now given his schedule is so busy.  He would like to do this later in the year if possible which is okay.  For now, we will reassess his inflammatory burden with a fecal calprotectin, he will go to the lab for that as well as complete his screening for tuberculosis.  Will let him know results, otherwise follow-up in 6 months.  PLAN: - continue Rinvoq  / day - lab for quantiferon gold - lab for fecal calprotectin - recommended EGD and colonoscopy - he prefers to wait until later this year, he is too busy to do it now.  - follow up in 6 months  Harlin Rain, MD [facility]

## 2022-06-16 LAB — QUANTIFERON-TB GOLD PLUS
Mitogen-NIL: 6.79 IU/mL
NIL: 0.03 IU/mL
QuantiFERON-TB Gold Plus: NEGATIVE
TB1-NIL: <0 IU/mL
TB2-NIL: <0 IU/mL

## 2022-08-08 ENCOUNTER — Other Ambulatory Visit: Payer: Self-pay

## 2022-08-08 DIAGNOSIS — K50819 Crohn's disease of both small and large intestine with unspecified complications: Secondary | ICD-10-CM

## 2022-08-08 MED ORDER — RINVOQ 30 MG PO TB24
1.0000 | ORAL_TABLET | Freq: Every day | ORAL | 5 refills | Status: DC
Start: 2022-08-08 — End: 2023-02-26

## 2022-08-15 ENCOUNTER — Ambulatory Visit: Payer: BC Managed Care – PPO

## 2022-08-15 DIAGNOSIS — K50819 Crohn's disease of both small and large intestine with unspecified complications: Secondary | ICD-10-CM

## 2022-08-15 DIAGNOSIS — Z79899 Other long term (current) drug therapy: Secondary | ICD-10-CM

## 2022-08-21 LAB — CALPROTECTIN, FECAL: Calprotectin, Fecal: 416 ug/g — ABNORMAL HIGH (ref 0–120)

## 2022-09-25 ENCOUNTER — Telehealth: Payer: Self-pay | Admitting: Gastroenterology

## 2022-09-25 ENCOUNTER — Other Ambulatory Visit (HOSPITAL_COMMUNITY): Payer: Self-pay

## 2022-09-25 ENCOUNTER — Telehealth: Payer: Self-pay | Admitting: Pharmacy Technician

## 2022-09-25 NOTE — Telephone Encounter (Signed)
Inbound call from patient's pharmacy stating patient's Rinvoq prior authorization has expired. Please advise, thank you.

## 2022-09-25 NOTE — Telephone Encounter (Signed)
PA has been submitted, and telephone encounter has been created.  PA has been approved and can now be filled at pt specialty pharmacy.

## 2022-09-25 NOTE — Telephone Encounter (Signed)
Pharmacy Patient Advocate Encounter   Received notification from Pt Calls Messages that prior authorization for RINVOQ 30MG  is required/requested.   Insurance verification completed.   The patient is insured through [employer] .   Per test claim: PA required; PA submitted to [employer] via CoverMyMeds Key/confirmation #/EOC [id number] Status is pending

## 2022-09-25 NOTE — Telephone Encounter (Signed)
Pharmacy Patient Advocate Encounter  Received notification from [facility] that Prior Authorization for [facility] 30MG  has been APPROVED from 7.30.24 to 7.30.25. Ran test claim, Copay is $MUST FILL AT SPECIALTY PHARMACY  PA #/Case ID/Reference #: [id number]

## 2022-09-25 NOTE — Telephone Encounter (Signed)
Inbound call from patient advised that prior authorization has been approved. Patient will contact pharmacy and give [place] a call back if any additional work up is needed.   Thank you

## 2022-10-05 ENCOUNTER — Other Ambulatory Visit: Payer: Self-pay

## 2022-10-26 ENCOUNTER — Encounter: Payer: BC Managed Care – PPO | Admitting: Gastroenterology

## 2023-01-04 ENCOUNTER — Encounter: Payer: Self-pay | Admitting: Gastroenterology

## 2023-02-25 ENCOUNTER — Other Ambulatory Visit: Payer: Self-pay | Admitting: Gastroenterology

## 2023-02-25 DIAGNOSIS — K50819 Crohn's disease of both small and large intestine with unspecified complications: Secondary | ICD-10-CM

## 2023-05-13 ENCOUNTER — Telehealth: Payer: Self-pay | Admitting: Gastroenterology

## 2023-05-13 NOTE — Telephone Encounter (Signed)
 Patient states that he went 4 days without his rinvoq medication (states it was his fault as he did not ask for a refill in time). He was able to restart the medication this morning. However, states that he did not go to work last night due to some abdominal discomfort and requests a doctors note for that date.   Patient is scheduled to see Alcide Evener in follow up 06/07/23. He is asked to make [place] aware should he have any increasing abdominal pain, diarrhea, blood in stool. He verbalizes understanding.  Dr Adela Lank- May I provide work note for yesterday?

## 2023-05-13 NOTE — Telephone Encounter (Signed)
 Letter created and made available via mychart.

## 2023-05-13 NOTE — Telephone Encounter (Signed)
 Inbound call from patient stating that he went a few days without Rinvoq and today he did not go to work because he isnt feeling well. Patient is requesting a call to discuss if he can get a work note sent to [employer]. Please advise.

## 2023-05-13 NOTE — Telephone Encounter (Signed)
 Yes okay to provide a note for work. He should resume his Rinvoq as soon as he can. Thanks

## 2023-06-07 ENCOUNTER — Ambulatory Visit: Admitting: Nurse Practitioner

## 2023-06-07 ENCOUNTER — Other Ambulatory Visit (INDEPENDENT_AMBULATORY_CARE_PROVIDER_SITE_OTHER)

## 2023-06-07 ENCOUNTER — Other Ambulatory Visit: Payer: Self-pay

## 2023-06-07 ENCOUNTER — Encounter: Payer: Self-pay | Admitting: Nurse Practitioner

## 2023-06-07 VITALS — BP 100/70 | HR 80 | Ht 72.75 in | Wt 220.2 lb

## 2023-06-07 DIAGNOSIS — K50919 Crohn's disease, unspecified, with unspecified complications: Secondary | ICD-10-CM

## 2023-06-07 DIAGNOSIS — R933 Abnormal findings on diagnostic imaging of other parts of digestive tract: Secondary | ICD-10-CM | POA: Diagnosis not present

## 2023-06-07 DIAGNOSIS — K5080 Crohn's disease of both small and large intestine without complications: Secondary | ICD-10-CM | POA: Diagnosis not present

## 2023-06-07 LAB — COMPREHENSIVE METABOLIC PANEL WITH GFR
ALT: 25 U/L (ref 0–53)
AST: 32 U/L (ref 0–37)
Albumin: 4.1 g/dL (ref 3.5–5.2)
Alkaline Phosphatase: 54 U/L (ref 39–117)
BUN: 14 mg/dL (ref 6–23)
CO2: 27 meq/L (ref 19–32)
Calcium: 9.3 mg/dL (ref 8.4–10.5)
Chloride: 105 meq/L (ref 96–112)
Creatinine, Ser: 0.9 mg/dL (ref 0.40–1.50)
GFR: 106.58 mL/min (ref >60.00)
Glucose, Bld: 90 mg/dL (ref 70–99)
Potassium: 3.9 meq/L (ref 3.5–5.1)
Sodium: 136 meq/L (ref 135–145)
Total Bilirubin: 0.6 mg/dL (ref 0.2–1.2)
Total Protein: 8.8 g/dL — ABNORMAL HIGH (ref 6.0–8.3)

## 2023-06-07 LAB — C-REACTIVE PROTEIN: CRP: <1 mg/dL (ref 0.5–20.0)

## 2023-06-07 LAB — VITAMIN D 25 HYDROXY (VIT D DEFICIENCY, FRACTURES): VITD: 27.29 ng/mL — ABNORMAL LOW (ref 30.00–100.00)

## 2023-06-07 LAB — CBC WITH DIFFERENTIAL/PLATELET
Basophils Absolute: 0.1 10*3/uL (ref 0.0–0.1)
Basophils Relative: 0.7 % (ref 0.0–3.0)
Eosinophils Absolute: 0.1 10*3/uL (ref 0.0–0.7)
Eosinophils Relative: 1.4 % (ref 0.0–5.0)
HCT: 38.6 % — ABNORMAL LOW (ref 39.0–52.0)
Hemoglobin: 12.9 g/dL — ABNORMAL LOW (ref 13.0–17.0)
Lymphocytes Relative: 10.3 % — ABNORMAL LOW (ref 12.0–46.0)
Lymphs Abs: 0.9 10*3/uL (ref 0.7–4.0)
MCHC: 33.5 g/dL (ref 30.0–36.0)
MCV: 83.9 fl (ref 78.0–100.0)
Monocytes Absolute: 0.8 10*3/uL (ref 0.1–1.0)
Monocytes Relative: 9.2 % (ref 3.0–12.0)
Neutro Abs: 6.8 10*3/uL (ref 1.4–7.7)
Neutrophils Relative %: 78.4 % — ABNORMAL HIGH (ref 43.0–77.0)
Platelets: 364 10*3/uL (ref 150.0–400.0)
RBC: 4.6 Mil/uL (ref 4.22–5.81)
RDW: 14.8 % (ref 11.5–15.5)
WBC: 8.6 10*3/uL (ref 4.0–10.5)

## 2023-06-07 NOTE — Progress Notes (Signed)
 Agree with assessment and plan as outlined.

## 2023-06-07 NOTE — Progress Notes (Signed)
 06/07/2023 Larry [name] [id number] [birth year]   Chief Complaint: Crohn's follow up  History of Present Illness: Larry [name] is a 43 year old male with a past medical history of Crohn's disease initially diagnosed in 2015.  As previously reviewed by Dr. Adela [name], he presented initially with spontaneous abscess which required drainage. He was followed in [place], [place] and maintained on Imuran 100mg  daily for 3 years or so, and then presented to [facility] hospital with another spontaneous perforation with multiple retroperitoneal abscess in Jan 2018. He was treated with bowel rest and TPN and antibiotics, IR was able to drain one of the abscesses at the time. He was seen by Colorectal surgery at [facility] (Dr. Drue [name]) after his initial hospitalization, and he declined surgery at the time as he was feeling better. Unfortunately he failed conservative managemenet and had recurrence of abscess formation, was admitted at [facility] again, and then ultimately transitioned to Dr. Drue [name] of [facility] and had definitive treatment with surgery.    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 Post-operatively he was started on Humira and methotrexate.    He did well for some time on Humira / MTX, unfortunately had active disease on colonoscopy March 2023 - had subtherapeutic level of Humira with rising antibody titer despite methotrexate. Switched to Rinvoq June 2023.  He was last seen in office by Dr. Adela [name] 06/14/2022 and at that time, he was feeling well on Rinvoq 30mg  every day without experiencing any abdominal pain or bloody stools. A colonoscopy was recommended to restage his disease and an EGD was recommended as well due to CTAP 04/03/2022 which showed chronic thickened folds in the stomach and increased thickened folds in the duodenum and jejunum. However, he wished to wait until the end of the year to  schedule these procedures which were not done. Quantiferon gold and fecal calprotectin level.   05/13/2023 phone note Patient states that he went 4 days without his Rinvoq medication (states it was his fault as he did not ask for a refill in time). He was able to restart the medication this morning. However, states that he did not go to work last night due to some abdominal discomfort and requests a doctors note for that date.   Currently, he denies having any N/V. No GERD symptoms. No dysphagia. His throat is a little dry in the mornings which he thinks may be due to Vyvanse. He had a small sore to the inside of his left lower lip one or two months ago which abated without recurrence.  He is passing two normal formed stools daily. No bloody or black stools. He is adherent with taking Rinvoq 30mg  every day. He endorses losing 10lbs since starting Vyvanse one year ago. He wishes to schedule an EGD and colonoscopy late August or September 2025.  He lives in [place].   IBD Health Care Maintenance: Annual Flu Vaccine - UTD Pneumococcal Vaccine : - 2018 [facility] hospital TB testing  QG negative 4/24 Vitamin D screening - Date: deficient - on supplementation Last Colonoscopy 3/23 - mild stenosis / ulceration at anastomosis, active left sided colitis Shingrix - 2023 completed  Colonoscopy 05/23/21: The perianal and digital rectal examinations were normal. - There was evidence of a prior end-to-end ileo-colonic anastomosis in the ascending colon. This was patent and was characterized by a a very mild stenosis with one focal small ulceration ( perhaps post surgical / ischemic) -  The terminal ileum appeared normal. - Patchy mild inflammation characterized by loss of vascularity was found in the rectum, in the sigmoid colon and in the descending colon. Very subtle in portions of sigmoid and descending colon, most prominent in the rectum. Biopsies were taken with a cold forceps for histology. - The exam was  otherwise without abnormality.   1. Surgical [P], left colon - MILD TO MODERATELY ACTIVE CHRONIC COLITIS. - NEGATIVE FOR DYSPLASIA. 2. Surgical [P], colon, rectum - MILDLY ACTIVE CHRONIC COLITIS. - NEGATIVE FOR DYSPLASIA.        Latest Ref Rng & Units 04/26/2022    3:56 PM 04/03/2022    3:02 AM 11/20/2021    4:07 PM  CBC  WBC 4.0 - 10.5 K/uL 8.8  9.0  8.7   Hemoglobin 13.0 - 17.0 g/dL 82.9  56.2  13.0   Hematocrit 39.0 - 52.0 % 43.4  46.1  42.0   Platelets 150 - 400 K/uL 301  306  281.0        Latest Ref Rng & Units 04/26/2022    3:56 PM 04/03/2022    3:02 AM 11/20/2021    4:07 PM  CMP  Glucose 70 - 99 mg/dL 865  784    BUN 6 - 20 mg/dL 13  15    Creatinine 6.96 - 1.24 mg/dL 2.95  2.84    Sodium 132 - 145 mmol/L 140  137    Potassium 3.5 - 5.1 mmol/L 3.8  4.1    Chloride 98 - 111 mmol/L 107  106    CO2 22 - 32 mmol/L 26  24    Calcium 8.9 - 10.3 mg/dL 9.0  9.2    Total Protein 6.5 - 8.1 g/dL 8.7   8.5   Total Bilirubin 0.3 - 1.2 mg/dL 0.8   0.7   Alkaline Phos 38 - 126 U/L 62   50   AST 15 - 41 U/L 28   29   ALT 0 - 44 U/L 24   31      Past Medical History:  Diagnosis Date   Crohn's disease (HCC)    Vitamin D deficiency    Past Surgical History:  Procedure Laterality Date   abcess removal     2015, 02-2016 abcess in colon   bowel abscess from crohns      COLONOSCOPY     IR GENERIC HISTORICAL  04/05/2016   IR RADIOLOGIST EVAL & MGMT 04/05/2016 Larry Come, MD [facility] INTERV RAD   LAPAROSCOPIC ILEOCECECTOMY  05/14/2016   with debridement of chronic abscess cavity asnd takedown of fistulas   Current Outpatient Medications on File Prior to Visit  Medication Sig Dispense Refill   acetaminophen (TYLENOL) 325 MG tablet Take 650 mg by mouth as needed.     hydrOXYzine (ATARAX) 25 MG tablet Take 25 mg by mouth at bedtime as needed.     RINVOQ 30 MG TB24 TAKE 1 TABLET BY MOUTH 1 TIME A DAY 30 tablet 5   sertraline (ZOLOFT) 50 MG tablet Take 50 mg by mouth daily.     VYVANSE 50  MG capsule Take 50 mg by mouth daily.     Current Facility-Administered Medications on File Prior to Visit  Medication Dose Route Frequency Provider Last Rate Last Admin   0.9 %  sodium chloride infusion  500 mL Intravenous Once Armbruster, Willaim Rayas, MD       No Known Allergies  Current Medications, Allergies, Past Medical History, Past Surgical History, Family History  and Social History were reviewed in [employer] record.  Review of Systems:   Constitutional: Negative for fever, sweats, chills or weight loss.  Respiratory: Negative for shortness of breath.   Cardiovascular: Negative for chest pain, palpitations and leg swelling.  Gastrointestinal: See HPI.  Musculoskeletal: Negative for back pain or muscle aches.  Neurological: Negative for dizziness, headaches or paresthesias.   Physical Exam: BP 100/70 (BP Location: Left Arm, Patient Position: Sitting, Cuff Size: Large)   Pulse 80   Ht 6' 0.75" (1.848 m) Comment: height measured without shoes  Wt 220 lb 4 oz (99.9 kg)   BMI 29.26 kg/m   Wt Readings from Last 3 Encounters:  06/07/23 220 lb 4 oz (99.9 kg)  06/14/22 227 lb 8 oz (103.2 kg)  04/03/22 230 lb (104.3 kg)    General: 43 year old male in no acute distress. Head: Normocephalic and atraumatic. Eyes: No scleral icterus. Conjunctiva pink . Ears: Normal auditory acuity. Mouth: Dentition intact. No ulcers or lesions.  Lungs: Clear throughout to auscultation. Heart: Regular rate and rhythm, no murmur. Abdomen: Soft, nontender and nondistended. No masses or hepatomegaly. Normal bowel sounds x 4 quadrants.  Rectal: Deferred.  Musculoskeletal: Symmetrical with no gross deformities. Extremities: No edema. Neurological: Alert oriented x 4. No focal deficits.  Psychological: Alert and cooperative. Normal mood and affect  Assessment and Recommendations:  43 year old male with Crohn's disease of the small and large intestine initially diagnosed in  2015. Previously did well on Humira / MTX, unfortunately had active disease on colonoscopy March 2023 - had subtherapeutic level of Humira with rising antibody titer despite Methotrexate then switched to Rinvoq since 07/2021.  -CBC, CMP, CRP, vitamin D, Quantiferon gold and fecal calprotectin level -Colonoscopy to restage Crohn's disease on Rinvoq and EGD to rule out UGI Crohn's disease benefits and risks discussed including risk with sedation, risk of bleeding, perforation and infection. Patient wishes to schedule procedures end of August or Sept 2025. He lives in [place] and he request contact info for medically accepted transport service to drive him to our endoscopy center. -Avoid NSAIDS -Patient to contact our office if he develops any abdominal pain, constipation or diarrhea   CTAP 262024 showed chronic thickened folds in the stomach and increased thickened folds in the duodenum and jejunum. -EGD as ordered above

## 2023-06-07 NOTE — Patient Instructions (Addendum)
 It has been recommended to you by your physician that you have a(n) Endoscopy & colonoscopy completed. Per your request, we did not schedule the procedure(s) today. Please contact our office at [phone] around the end of June or the beginning of July for scheduling. You will be scheduled for a pre-visit and procedure at that time.  Your provider has requested that you go to the basement level for lab work before leaving today. Press "B" on the elevator. The lab is located at the first door on the left as you exit the elevator.  Due to recent changes in healthcare laws, you may see the results of your imaging and laboratory studies on MyChart before your provider has had a chance to review them.  We understand that in some cases there may be results that are confusing or concerning to you. Not all laboratory results come back in the same time frame and the provider may be waiting for multiple results in order to interpret others.  Please give [place] 48 hours in order for your provider to thoroughly review all the results before contacting the office for clarification of your results.   Thank you for trusting me with your gastrointestinal care!   Alcide Evener, CRNP

## 2023-06-10 NOTE — Addendum Note (Signed)
 Addended by: Corine Dice on: 06/10/2023 08:12 AM   Modules accepted: Orders

## 2023-06-11 LAB — QUANTIFERON-TB GOLD PLUS
Mitogen-NIL: 0.5 [IU]/mL
NIL: 0.02 [IU]/mL
QuantiFERON-TB Gold Plus: NEGATIVE
TB1-NIL: 0 [IU]/mL
TB2-NIL: 0 [IU]/mL

## 2023-07-08 ENCOUNTER — Telehealth: Payer: Self-pay

## 2023-07-08 NOTE — Telephone Encounter (Signed)
 Spoke with patient and told him it was time for some repeat labs.  Patient stated he will come this week to get that done.

## 2023-07-08 NOTE — Telephone Encounter (Signed)
-----   Message from CMA Denyzha K sent at 06/10/2023  8:10 AM EDT ----- Regarding: Labs CBC and IBC + ferritin to be done in 4 weeks

## 2023-08-28 ENCOUNTER — Other Ambulatory Visit (HOSPITAL_COMMUNITY): Payer: Self-pay

## 2023-09-13 ENCOUNTER — Other Ambulatory Visit (HOSPITAL_COMMUNITY): Payer: Self-pay

## 2023-09-13 ENCOUNTER — Telehealth: Payer: Self-pay

## 2023-09-13 NOTE — Telephone Encounter (Signed)
 Pharmacy Patient Advocate Encounter   Received notification from CoverMyMeds that prior authorization for Rinvoq  30MG  er tablets is required/requested.   Insurance verification completed.   The patient is insured through [employer] .   Per test claim: PA required; PA submitted to above mentioned insurance via Fax Key/confirmation #/EOC [id number] [employer] Status is pending

## 2023-09-27 ENCOUNTER — Other Ambulatory Visit (HOSPITAL_COMMUNITY): Payer: Self-pay

## 2023-09-27 NOTE — Telephone Encounter (Signed)
 Pharmacy Patient Advocate Encounter  Contacted insurance to check on status of request. Did not receive. Verified fax number and it is a different one than what was originally provided. Re-faxing to new fax number provided by rep: [phone]

## 2023-10-07 ENCOUNTER — Other Ambulatory Visit: Payer: Self-pay | Admitting: Gastroenterology

## 2023-10-07 DIAGNOSIS — K50819 Crohn's disease of both small and large intestine with unspecified complications: Secondary | ICD-10-CM

## 2023-10-16 ENCOUNTER — Other Ambulatory Visit (HOSPITAL_COMMUNITY): Payer: Self-pay

## 2023-12-16 ENCOUNTER — Other Ambulatory Visit: Payer: Self-pay | Admitting: Gastroenterology

## 2023-12-16 DIAGNOSIS — K50819 Crohn's disease of both small and large intestine with unspecified complications: Secondary | ICD-10-CM

## 2024-01-31 ENCOUNTER — Ambulatory Visit

## 2024-01-31 VITALS — Ht 72.75 in | Wt 215.0 lb

## 2024-01-31 DIAGNOSIS — K50819 Crohn's disease of both small and large intestine with unspecified complications: Secondary | ICD-10-CM

## 2024-01-31 MED ORDER — NA SULFATE-K SULFATE-MG SULF 17.5-3.13-1.6 GM/177ML PO SOLN: 1.0000 | Freq: Once | ORAL | 0 refills | Status: AC

## 2024-01-31 NOTE — Progress Notes (Signed)
 PCP MD at time of PV: Favero, DO  __________________________________________________________________________________________________________________________________________  No egg allergy known to patient  No soy allergy known to patient No issues known to pt with past sedation with any surgeries or procedures Patient denies ever being told they had issues or difficulty with intubation  No FH of Malignant Hyperthermia Pt is not on diet pills Pt is not on  home 02  Pt is not on blood thinners  No A fib or A flutter Have any cardiac testing pending--no LOA: independent  No Chew or Snuff tobacco __________________________________________________________________________________________________________________________________________  Constipation: no Prep: suprep  __________________________________________________________________________________________________________________________________________  PV completed with patient. Prep instructions reviewed and provided during apt. Rx sent to preferred pharmacy.  __________________________________________________________________________________________________________________________________________  Patient's chart reviewed by Norleen Schillings CNRA prior to previsit and patient appropriate for the [facility].  Previsit completed and red dot placed by patient's name on their procedure day (on provider's schedule).

## 2024-02-13 ENCOUNTER — Telehealth: Payer: Self-pay | Admitting: Gastroenterology

## 2024-02-13 NOTE — Telephone Encounter (Signed)
 The following patient was scheduled for a colonoscopy on tomorrow. Unfortunately he has a family emergency and rescheduled the appointment for February.

## 2024-02-14 ENCOUNTER — Encounter: Admitting: Gastroenterology

## 2024-02-24 ENCOUNTER — Other Ambulatory Visit: Payer: Self-pay | Admitting: Gastroenterology

## 2024-02-24 DIAGNOSIS — K50819 Crohn's disease of both small and large intestine with unspecified complications: Secondary | ICD-10-CM

## 2024-02-25 ENCOUNTER — Other Ambulatory Visit: Payer: Self-pay

## 2024-04-08 ENCOUNTER — Encounter: Admitting: Gastroenterology
